# Patient Record
Sex: Male | Born: 1955
Health system: Southern US, Community
[De-identification: ages and names within clinical notes are randomized; demographics above are authoritative.]

## PROBLEM LIST (undated history)

## (undated) DIAGNOSIS — R112 Nausea with vomiting, unspecified: Secondary | ICD-10-CM

## (undated) DIAGNOSIS — Z9889 Other specified postprocedural states: Secondary | ICD-10-CM

## (undated) DIAGNOSIS — R5383 Other fatigue: Secondary | ICD-10-CM

## (undated) DIAGNOSIS — M1990 Unspecified osteoarthritis, unspecified site: Secondary | ICD-10-CM

## (undated) DIAGNOSIS — S83207A Unspecified tear of unspecified meniscus, current injury, left knee, initial encounter: Secondary | ICD-10-CM

## (undated) DIAGNOSIS — Z87442 Personal history of urinary calculi: Secondary | ICD-10-CM

## (undated) DIAGNOSIS — G4730 Sleep apnea, unspecified: Secondary | ICD-10-CM

## (undated) DIAGNOSIS — R600 Localized edema: Secondary | ICD-10-CM

## (undated) DIAGNOSIS — E559 Vitamin D deficiency, unspecified: Secondary | ICD-10-CM

## (undated) DIAGNOSIS — I10 Essential (primary) hypertension: Secondary | ICD-10-CM

## (undated) DIAGNOSIS — T4145XA Adverse effect of unspecified anesthetic, initial encounter: Secondary | ICD-10-CM

## (undated) DIAGNOSIS — T7840XA Allergy, unspecified, initial encounter: Secondary | ICD-10-CM

## (undated) DIAGNOSIS — I2510 Atherosclerotic heart disease of native coronary artery without angina pectoris: Secondary | ICD-10-CM

## (undated) DIAGNOSIS — M2550 Pain in unspecified joint: Secondary | ICD-10-CM

## (undated) DIAGNOSIS — M549 Dorsalgia, unspecified: Secondary | ICD-10-CM

## (undated) DIAGNOSIS — T8859XA Other complications of anesthesia, initial encounter: Secondary | ICD-10-CM

## (undated) DIAGNOSIS — E785 Hyperlipidemia, unspecified: Secondary | ICD-10-CM

## (undated) DIAGNOSIS — R0602 Shortness of breath: Secondary | ICD-10-CM

## (undated) HISTORY — DX: Sleep apnea, unspecified: G47.30

## (undated) HISTORY — DX: Dorsalgia, unspecified: M54.9

## (undated) HISTORY — PX: BACK SURGERY: SHX140

## (undated) HISTORY — DX: Atherosclerotic heart disease of native coronary artery without angina pectoris: I25.10

## (undated) HISTORY — DX: Localized edema: R60.0

## (undated) HISTORY — DX: Pain in unspecified joint: M25.50

## (undated) HISTORY — PX: OTHER SURGICAL HISTORY: SHX169

## (undated) HISTORY — DX: Other fatigue: R53.83

## (undated) HISTORY — DX: Allergy, unspecified, initial encounter: T78.40XA

## (undated) HISTORY — DX: Hyperlipidemia, unspecified: E78.5

## (undated) HISTORY — PX: MR HIPS BILATERAL: HXRAD169

## (undated) HISTORY — DX: Vitamin D deficiency, unspecified: E55.9

## (undated) HISTORY — DX: Unspecified osteoarthritis, unspecified site: M19.90

## (undated) HISTORY — DX: Shortness of breath: R06.02

---

## 1997-01-16 HISTORY — PX: LAMINECTOMY: SHX219

## 1997-07-19 ENCOUNTER — Emergency Department (HOSPITAL_COMMUNITY): Admission: EM | Admit: 1997-07-19 | Discharge: 1997-07-19 | Payer: Self-pay | Admitting: Emergency Medicine

## 2004-01-25 ENCOUNTER — Ambulatory Visit: Payer: Self-pay | Admitting: Internal Medicine

## 2006-11-26 ENCOUNTER — Telehealth: Payer: Self-pay | Admitting: Internal Medicine

## 2007-01-03 ENCOUNTER — Ambulatory Visit: Payer: Self-pay | Admitting: Internal Medicine

## 2007-01-03 DIAGNOSIS — M1990 Unspecified osteoarthritis, unspecified site: Secondary | ICD-10-CM | POA: Insufficient documentation

## 2007-01-03 DIAGNOSIS — M519 Unspecified thoracic, thoracolumbar and lumbosacral intervertebral disc disorder: Secondary | ICD-10-CM | POA: Insufficient documentation

## 2007-01-04 ENCOUNTER — Encounter: Payer: Self-pay | Admitting: Internal Medicine

## 2007-01-18 ENCOUNTER — Inpatient Hospital Stay (HOSPITAL_COMMUNITY): Admission: RE | Admit: 2007-01-18 | Discharge: 2007-01-21 | Payer: Self-pay | Admitting: Orthopaedic Surgery

## 2007-04-07 ENCOUNTER — Encounter: Payer: Self-pay | Admitting: Emergency Medicine

## 2007-04-07 ENCOUNTER — Observation Stay (HOSPITAL_COMMUNITY): Admission: AD | Admit: 2007-04-07 | Discharge: 2007-04-09 | Payer: Self-pay | Admitting: Urology

## 2007-04-22 ENCOUNTER — Ambulatory Visit (HOSPITAL_COMMUNITY): Admission: RE | Admit: 2007-04-22 | Discharge: 2007-04-22 | Payer: Self-pay | Admitting: Urology

## 2008-01-17 HISTORY — PX: CYSTOSCOPY KIDNEY W/ URETERAL GUIDE WIRE: SUR371

## 2008-08-20 ENCOUNTER — Ambulatory Visit: Payer: Self-pay | Admitting: Internal Medicine

## 2008-08-20 DIAGNOSIS — Z87442 Personal history of urinary calculi: Secondary | ICD-10-CM

## 2008-09-25 ENCOUNTER — Inpatient Hospital Stay (HOSPITAL_COMMUNITY): Admission: RE | Admit: 2008-09-25 | Discharge: 2008-09-28 | Payer: Self-pay | Admitting: Orthopaedic Surgery

## 2008-10-13 ENCOUNTER — Telehealth (INDEPENDENT_AMBULATORY_CARE_PROVIDER_SITE_OTHER): Payer: Self-pay | Admitting: *Deleted

## 2009-04-08 ENCOUNTER — Encounter: Payer: Self-pay | Admitting: Internal Medicine

## 2010-02-13 LAB — CONVERTED CEMR LAB
Bilirubin, Direct: 0.2 mg/dL (ref 0.0–0.3)
Eosinophils Absolute: 0.2 10*3/uL (ref 0.0–0.6)
Eosinophils Relative: 2.5 % (ref 0.0–5.0)
GFR calc Af Amer: 101 mL/min
GFR calc non Af Amer: 84 mL/min
Glucose, Bld: 98 mg/dL (ref 70–99)
HCT: 45.3 % (ref 39.0–52.0)
Lymphocytes Relative: 25.1 % (ref 12.0–46.0)
MCV: 87 fL (ref 78.0–100.0)
Neutro Abs: 5.5 10*3/uL (ref 1.4–7.7)
Neutrophils Relative %: 64.2 % (ref 43.0–77.0)
PSA: 1.57 ng/mL (ref 0.10–4.00)
Platelets: 179 10*3/uL (ref 150–400)
Potassium: 4.7 meq/L (ref 3.5–5.1)
Sodium: 139 meq/L (ref 135–145)
Triglycerides: 86 mg/dL (ref 0–149)
WBC: 8.5 10*3/uL (ref 4.5–10.5)

## 2010-03-25 ENCOUNTER — Encounter (INDEPENDENT_AMBULATORY_CARE_PROVIDER_SITE_OTHER): Payer: Self-pay | Admitting: *Deleted

## 2010-03-29 NOTE — Letter (Signed)
Summary: Pre Visit Letter Revised  [facility]  [address] [place], [place] [address]   Phone: [phone]  Fax: [phone]        03/25/2010 MRN: [id number] Keith Brown [address] [place], [place]  [address]             Procedure Date:  04-29-10           Direct Colon---Dr. Juanda Chance   Welcome to the Gastroenterology Division at [facility].    You are scheduled to see a nurse for your pre-procedure visit on 04-14-10 at 1:00p.m. on the 3rd floor at [facility], [address].  We ask that you try to arrive at our office 15 minutes prior to your appointment time to allow for check-in.  Please take a minute to review the attached form.  If you answer "Yes" to one or more of the questions on the first page, we ask that you call the person listed at your earliest opportunity.  If you answer "No" to all of the questions, please complete the rest of the form and bring it to your appointment.    Your nurse visit will consist of discussing your medical and surgical history, your immediate family medical history, and your medications.   If you are unable to list all of your medications on the form, please bring the medication bottles to your appointment and we will list them.  We will need to be aware of both prescribed and over the counter drugs.  We will need to know exact dosage information as well.    Please be prepared to read and sign documents such as consent forms, a financial agreement, and acknowledgement forms.  If necessary, and with your consent, a friend or relative is welcome to sit-in on the nurse visit with you.  Please bring your insurance card so that we may make a copy of it.  If your insurance requires a referral to see a specialist, please bring your referral form from your primary care physician.  No co-pay is required for this nurse visit.     If you cannot keep your appointment, please call [phone] to cancel or reschedule prior to your  appointment date.  This allows [place] the opportunity to schedule an appointment for another patient in need of care.    Thank you for choosing [facility] for your medical needs.  We appreciate the opportunity to care for you.  Please visit [place] at our website  to learn more about our practice.  Sincerely, The Gastroenterology Division

## 2010-04-14 ENCOUNTER — Ambulatory Visit (AMBULATORY_SURGERY_CENTER): Payer: BC Managed Care – PPO | Admitting: *Deleted

## 2010-04-14 VITALS — Ht 70.0 in | Wt 321.0 lb

## 2010-04-14 DIAGNOSIS — Z1211 Encounter for screening for malignant neoplasm of colon: Secondary | ICD-10-CM

## 2010-04-14 DIAGNOSIS — Z800 Family history of malignant neoplasm of digestive organs: Secondary | ICD-10-CM

## 2010-04-14 MED ORDER — PEG-KCL-NACL-NASULF-NA ASC-C 100 G PO SOLR: 1.0000 | Freq: Once | ORAL | Status: AC

## 2010-04-22 LAB — COMPREHENSIVE METABOLIC PANEL
ALT: 22 U/L (ref 0–53)
AST: 23 U/L (ref 0–37)
Alkaline Phosphatase: 78 U/L (ref 39–117)
CO2: 25 mEq/L (ref 19–32)
Glucose, Bld: 106 mg/dL — ABNORMAL HIGH (ref 70–99)
Potassium: 4.9 mEq/L (ref 3.5–5.1)
Sodium: 140 mEq/L (ref 135–145)
Total Protein: 7 g/dL (ref 6.0–8.3)

## 2010-04-22 LAB — URINALYSIS, ROUTINE W REFLEX MICROSCOPIC
Glucose, UA: NEGATIVE mg/dL
Ketones, ur: NEGATIVE mg/dL
Specific Gravity, Urine: 1.019 (ref 1.005–1.030)
pH: 5 (ref 5.0–8.0)

## 2010-04-22 LAB — CBC
HCT: 33.5 % — ABNORMAL LOW (ref 39.0–52.0)
HCT: 34 % — ABNORMAL LOW (ref 39.0–52.0)
Hemoglobin: 11.4 g/dL — ABNORMAL LOW (ref 13.0–17.0)
Hemoglobin: 11.6 g/dL — ABNORMAL LOW (ref 13.0–17.0)
Hemoglobin: 12 g/dL — ABNORMAL LOW (ref 13.0–17.0)
MCHC: 33.9 g/dL (ref 30.0–36.0)
MCHC: 34.3 g/dL (ref 30.0–36.0)
MCV: 87.2 fL (ref 78.0–100.0)
RBC: 3.84 MIL/uL — ABNORMAL LOW (ref 4.22–5.81)
RBC: 3.92 MIL/uL — ABNORMAL LOW (ref 4.22–5.81)
RBC: 5.26 MIL/uL (ref 4.22–5.81)
RDW: 13.5 % (ref 11.5–15.5)
RDW: 14 % (ref 11.5–15.5)
WBC: 7.6 10*3/uL (ref 4.0–10.5)
WBC: 8.6 10*3/uL (ref 4.0–10.5)

## 2010-04-22 LAB — BASIC METABOLIC PANEL
BUN: 8 mg/dL (ref 6–23)
CO2: 24 mEq/L (ref 19–32)
CO2: 29 mEq/L (ref 19–32)
Calcium: 8.5 mg/dL (ref 8.4–10.5)
Chloride: 101 mEq/L (ref 96–112)
Chloride: 106 mEq/L (ref 96–112)
Creatinine, Ser: 0.96 mg/dL (ref 0.4–1.5)
GFR calc Af Amer: >60 mL/min (ref >60)
GFR calc non Af Amer: >60 mL/min (ref >60)
Glucose, Bld: 112 mg/dL — ABNORMAL HIGH (ref 70–99)
Glucose, Bld: 124 mg/dL — ABNORMAL HIGH (ref 70–99)
Potassium: 3.8 mEq/L (ref 3.5–5.1)
Potassium: 4 mEq/L (ref 3.5–5.1)
Sodium: 133 mEq/L — ABNORMAL LOW (ref 135–145)
Sodium: 134 mEq/L — ABNORMAL LOW (ref 135–145)
Sodium: 138 mEq/L (ref 135–145)

## 2010-04-22 LAB — APTT: aPTT: 48 seconds — ABNORMAL HIGH (ref 24–37)

## 2010-04-22 LAB — PROTIME-INR: INR: 1.9 — ABNORMAL HIGH (ref 0.00–1.49)

## 2010-04-29 ENCOUNTER — Ambulatory Visit (AMBULATORY_SURGERY_CENTER): Payer: BC Managed Care – PPO | Admitting: Internal Medicine

## 2010-04-29 ENCOUNTER — Encounter: Payer: Self-pay | Admitting: Internal Medicine

## 2010-04-29 VITALS — BP 132/86 | HR 50 | Temp 97.6°F | Resp 19 | Ht 69.0 in | Wt 321.0 lb

## 2010-04-29 DIAGNOSIS — K5730 Diverticulosis of large intestine without perforation or abscess without bleeding: Secondary | ICD-10-CM

## 2010-04-29 DIAGNOSIS — Z1211 Encounter for screening for malignant neoplasm of colon: Secondary | ICD-10-CM

## 2010-04-29 DIAGNOSIS — D126 Benign neoplasm of colon, unspecified: Secondary | ICD-10-CM

## 2010-04-29 DIAGNOSIS — Z800 Family history of malignant neoplasm of digestive organs: Secondary | ICD-10-CM

## 2010-04-29 DIAGNOSIS — R933 Abnormal findings on diagnostic imaging of other parts of digestive tract: Secondary | ICD-10-CM

## 2010-04-29 MED ORDER — SODIUM CHLORIDE 0.9 % IV SOLN: 500.0000 mL | INTRAVENOUS | Status: DC

## 2010-04-29 NOTE — Patient Instructions (Signed)
Discharged instructions given with verbal understanding. Hand outs on polyps and diverticulosis given. Resume previous medications. 

## 2010-05-02 ENCOUNTER — Telehealth: Payer: Self-pay | Admitting: *Deleted

## 2010-05-02 NOTE — Telephone Encounter (Signed)
No answer, left message

## 2010-05-03 ENCOUNTER — Encounter: Payer: Self-pay | Admitting: Internal Medicine

## 2010-05-31 NOTE — Op Note (Signed)
NAMESRIHITH, AQUILINO NO.:  [id number]   MEDICAL RECORD NO.:  [id number]          PATIENT TYPE:  INP   LOCATION:  5025                         FACILITY:  [facility]   PHYSICIAN:  Vanita Panda. Magnus Ivan, M.D.DATE OF BIRTH:  05/10/1955   DATE OF PROCEDURE:  01/18/2007  DATE OF DISCHARGE:  01/21/2007                               OPERATIVE REPORT   PREOPERATIVE DIAGNOSIS:  Severe degenerative joint disease right hip.   POSTOPERATIVE DIAGNOSES:  Severe degenerative joint disease right hip.   PROCEDURE:  Right total hip arthroplasty.   COMPONENTS:  DePuy ASR acetabular cup size 58, size 51 femoral head,  Summit tapered hip stem with duofix coating high offset size 6, -1  tapered sleeve adapter.   SURGEON:  Doneen Poisson, MD   ASSISTANT:  Veverly Fells. Ophelia Charter, M.D.   ANESTHESIA:  General.   ANTIBIOTICS:  Two grams IV Ancef.   BLOOD LOSS:  Eight-hundred mL.   COMPLICATIONS:  None.   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  He has tried anti-inflammatories  and has been doing well for some time but has gotten to where this has  severely affected his activities of daily living.  It was recommend he  undergo a total hip replacement on the right side.  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   DESCRIPTION OF PROCEDURE:  After informed consent was obtained and the  appropriate right hip was marked, Dr. Otelia Sergeant was brought to the operating  room, placed supine on the operating table.  General anesthesia was  obtained.  He was then turned into lateral decubitus position with the  left hip down and the right hip up.  His right hip was prepped and  draped with DuraPrep and sterile drapes including sterile stockinette.  A time-out was  called and he was identified as correct patient and  correct extremity.  I then made an incision directly centered over the  greater trochanter and carried this proximally and distally. I carried  this deeply through the tissues and hemostasis was obtained using  electrocautery.  I then was able to identify the IT band and the tensor  fascia lata. I divided this longitudinally so that I could visualize the  vastus ridge. A Charnley retractor was put in place.  I then  meticulously dissected two-thirds of the gluteus medius and minimus off  of as a sleeve off of the vastus ridge and carried this anteriorly.  I  was able to expose the hip capsule and divided the hip capsule in a T-  type fashion.  There was certainly synovitis noted and effusion in the  joint was minimal. With the lateral leg bags I was able to then  dislocate the hip and bring the leg flexed and externally rotated off  the table.  I then made a neck cut approximately a fingerbreadth  proximal to the lesser trochanter and removed the head in its entirety.  Next the acetabulum was cleared of debris and we started reaming with  the acetabulum.  We reamed this in 1-mm increments up to a 59.  We  trialed an acetabular cup and then once the trial cup had a good rim fit  and I felt confident on the version I turned attention to the proximal  femur.  The canal was entered with a cookie cutter guide and then a  canal finder.  We then reamed in 1-mm increments from size one up to the  size 6-7.  I then used a broach was size 1 trying to lateralize the  broach and once we had got to a size 6, this was felt to be the most  stable fit with good cortical contact and rim.  We tried a -1 taper stem  and placed a 51 head and reduced this into the acetabulum.  I felt that  there was minimal shuck and the leg length appeared to be equal.  We got  an interoperative plain film that showed adequate version of the cup and  overall good fill of  the stem. I then removed all of these components  and thoroughly irrigated the acetabulum as well as the femoral canal.  The trial components again were removed and the real components were  placed including the acetabular with metal-on-metal liner, the size six  hydroxyapatite coated duofix stem which was proximally porous coated and  then finally the -1 tapered neck and the 51 head.  This was used on the  acetabulum.  I put the hip through a range of motion.  It was stable in  all planes.  I then thoroughly irrigated the hip joint and the soft  tissues with pulsatile lavage.  I closed the joint capsule with  interrupted #5 Ethilon suture.  I then was able to reapproximate the  gluteus medius and minimus back to their soft tissue attachment at the  vastus ridge and oversewed this with Ethilon suture followed by #1  Vicryl suture.  I then closed the IT band with interrupted #1 Vicryl  suture.  The deep fatty layer was closed with zero Vicryl followed by 2-  0 Vicryl subcutaneous tissue and staples on the skin.  Sterile dressing  was then applied.  I rolled him back into a supine position.  His leg  lengths felt exactly equal.  He was then awakened, extubated and taken  to recovery room in stable condition.  All final counts were correct and  there were no complications noted.      Vanita Panda. Magnus Ivan, M.D.  Electronically Signed     CYB/MEDQ  D:  02/05/2007  T:  02/06/2007  Job:  [id number]

## 2010-05-31 NOTE — Op Note (Signed)
NAMEJAYLEE, LANTRY                 ACCOUNT NO.:  [id number]   MEDICAL RECORD NO.:  [id number]          PATIENT TYPE:  INP   LOCATION:  1438                         FACILITY:  [facility]   PHYSICIAN:  Excell Seltzer. Annabell Howells, M.D.    DATE OF BIRTH:  08-09-1955   DATE OF PROCEDURE:  04/08/2007  DATE OF DISCHARGE:                               OPERATIVE REPORT   PROCEDURE:  Cystoscopy, left retrograde pyelogram with interpretation,  left flexible ureteroscopy with attempted lasertripsy of stone and  insertion of left double-J stent.   PREOPERATIVE DIAGNOSIS:  Left proximal ureteral stone.   POSTOPERATIVE DIAGNOSIS:  Left proximal ureteral stone.   SURGEON:  Dr. Bjorn Pippin.   ANESTHESIA:  General.   DRAINS:  6-French x 26 cm left double-J stent.   COMPLICATIONS:  None.   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  He was found to have a  4 mm stone in the lower portion of the left proximal ureter with mild  hydro.  He has continued to have symptoms and has elected active therapy  and it was felt that attempted ureteroscopy was indicated.   FINDINGS AND PROCEDURE:  The patient had been on Cipro.  He was taken to  the operating room where general anesthetic was induced.  He was placed  in lithotomy position with great care being taken to avoid overflexion  of his hips since he had a recent hip replacement of the right.  His  genitalia was prepped with Betadine solution.  He was draped in the  usual sterile fashion.  Cystoscopy was performed using a 22-French scope  and 12 and 70 degrees lenses.  Examination revealed a normal urethra.  The external sphincter was intact.  The prostatic urethra was short  without obstruction.  Examination of bladder revealed no trabeculation.  The mucous was smooth without lesions.  The ureteral orifices were  unremarkable.   The left ureteral orifice was cannulated with a 5-French open end  catheter and contrast was instilled in retrograde fashion.   Left retrograde pyelogram revealed no evidence of filling defect and it  was felt that the stone was flushed back to the kidney.  There was  narrowing where the stone had been on KUB.  The ureters were markedly  delicate as was the internal collecting system.   A guidewire was then passed up to the kidney and a 12-French introducer  sheath inner core dilator 55 cm in length was used to dilate the ureter  to the renal pelvis.  I did not feel comfortable using the actual sheath  because of the delicate nature the ureter and fear of ureteral tearing.   The 6-French flexible ureteroscope was then inserted over the wire to  the kidney.  The wire was removed and the kidney was inspected. The  stone was noted in the lower pole collecting system in a calix.  It did  appear to measure approximately 4 mm.   An attempt was made to engage the stone with a 0.275 holmium laser  fiber.  I was only able to fire the laser approximately a dozen times  before determining that I was not able to come to bear on the stone and  there was increased bleeding that precluded adequate visualization.  At  this point I removed the laser fiber and irrigated the collecting system  once again without ready visualization of the stone.  At this point it  was felt that the most prudent option would be to place a stent.  The  guidewire was then reinserted through the ureteroscope to the kidney  under fluoroscopic guidance.  The ureteroscope was removed.  The  cystoscope was reinserted over the wire and a 6-French 26 cm double-J  stent was inserted without difficulty to the kidney under fluoroscopic  guidance.  The wire was removed leaving a coil in the kidney and a coil  in the bladder.  At this point the bladder was drained.  A B & O  suppository was placed.  The patient was taken down out of the lithotomy  position.  His anesthetic was reversed.  He was  removed to the recovery  room in stable condition.  He was given an additional dose of Cipro  since his last dose was at 11 this morning.  He was taken to the  recovery room in stable condition and there were no complications.      Excell Seltzer. Annabell Howells, M.D.  Electronically Signed     JJW/MEDQ  D:  04/08/2007  T:  04/09/2007  Job:  [id number]

## 2010-05-31 NOTE — Op Note (Signed)
NAMEXZAVIAN, SEMMEL NO.:  [id number]   MEDICAL RECORD NO.:  [id number]          PATIENT TYPE:  INP   LOCATION:  2550                         FACILITY:  [facility]   PHYSICIAN:  Vanita Panda. Magnus Ivan, M.D.DATE OF BIRTH:  December 16, 1955   DATE OF PROCEDURE:  01/18/2007  DATE OF DISCHARGE:                               OPERATIVE REPORT   PREOPERATIVE DIAGNOSIS:  Severe osteoarthritis right hip.   POSTOPERATIVE DIAGNOSIS:  Severe osteoarthritis right hip.   PROCEDURE:  A right total hip arthroplasty.   __________  Acetabular component size 58 mm, 51 mm __________  minus 1 ASR sleeve, __________   SURGEON:  __________   ASSISTANT:  __________  MD   __________   ANESTHESIA:  General.   __________  55 year-old male with severe osteoarthritis __________  This has become  quite debilitating for him.  __________ and he wishes to proceed with a  total hip replacement.  He understands the risks and benefits fully and  agrees to proceed with surgery.   PROCEDURE:  INAUDIBLE      Vanita Panda. Magnus Ivan, M.D.  Electronically Signed     CYB/MEDQ  D:  01/18/2007  T:  01/18/2007  Job:  [id number]

## 2010-06-03 NOTE — Discharge Summary (Signed)
NAMEMACLAIN, COHRON NO.:  [id number]   MEDICAL RECORD NO.:  [id number]          PATIENT TYPE:  INP   LOCATION:  5025                         FACILITY:  [facility]   PHYSICIAN:  Vanita Panda. Magnus Ivan, M.D.DATE OF BIRTH:  09-17-1955   DATE OF ADMISSION:  01/18/2007  DATE OF DISCHARGE:  01/21/2007                               DISCHARGE SUMMARY   ADMITTING DIAGNOSIS:  Severe degenerative joint disease right hip.   DISCHARGE DIAGNOSIS:  Severe degenerative joint disease right hip.   PROCEDURE:  Right total hip arthroplasty on January 18, 2007.   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  He had failed  conservative nonoperative treatment, and this was greatly affecting his  activities of daily living, so he agreed to proceed with surgery.  He  was taken to the operating room on the day of admission, where he  underwent a right total hip arthroplasty without complications.  He was  then admitted to a regular orthopedic floor bed.  Postoperatively, we  started him on weightbearing as tolerated, and worked with physical  therapy and occupational therapy.  By postop day #1, the Foley catheter  was removed.  He started tolerating an oral diet as well as oral pain  medications.  By the day of discharge, he was deemed safe for discharge  to home, with close observation from home health physical therapy.  He  was on Coumadin for DVT prophylaxis.  He was tolerating oral pain  medications.  He was afebrile, with stable vital signs.  His incision  was clean, dry, intact.   DISPOSITION:  To home.   DISCHARGE MEDICATIONS:  1. Coumadin, titrated for target INR of 2-3.  2. Vicodin 5/500, 1-2 p.o. q.4-6 h. p.r.n.  3. Robaxin 500 mg 1 p.o. q.6 h. p.r.n..   DISCHARGE INSTRUCTIONS:  While he is at home, physical therapy will come  into his house to work with mobilization.  He can get his hip  incision  wet at postop day #5.  Monitoring of his INR will be obtained through  home health as well.  Followup will be in the office and will be  established 2 weeks after discharge.      Vanita Panda. Magnus Ivan, M.D.  Electronically Signed     CYB/MEDQ  D:  02/05/2007  T:  02/06/2007  Job:  [id number]

## 2010-10-06 LAB — BASIC METABOLIC PANEL
BUN: 10
Calcium: 8.3 — ABNORMAL LOW
Calcium: 8.3 — ABNORMAL LOW
Chloride: 101
Creatinine, Ser: 0.88
Creatinine, Ser: 0.9
GFR calc Af Amer: >60
GFR calc Af Amer: >60
GFR calc non Af Amer: >60
GFR calc non Af Amer: >60
GFR calc non Af Amer: >60
Potassium: 3.6
Potassium: 3.9
Sodium: 137

## 2010-10-06 LAB — CBC
HCT: 33.8 — ABNORMAL LOW
Hemoglobin: 11.8 — ABNORMAL LOW
MCV: 86.5
Platelets: 145 — ABNORMAL LOW
RBC: 3.91 — ABNORMAL LOW
RBC: 3.99 — ABNORMAL LOW
WBC: 12.4 — ABNORMAL HIGH
WBC: 8.9

## 2010-10-06 LAB — PROTIME-INR
INR: 1.2
INR: 1.6 — ABNORMAL HIGH
Prothrombin Time: 15.6 — ABNORMAL HIGH
Prothrombin Time: 19.5 — ABNORMAL HIGH
Prothrombin Time: 21.9 — ABNORMAL HIGH

## 2010-10-10 LAB — URINE MICROSCOPIC-ADD ON

## 2010-10-10 LAB — CBC
HCT: 45.1
MCHC: 33.8
MCV: 84.4
Platelets: 168
RDW: 14.6
WBC: 8.9

## 2010-10-10 LAB — COMPREHENSIVE METABOLIC PANEL
AST: 25
Albumin: 4.1
BUN: 14
Calcium: 9.2
Chloride: 106
Creatinine, Ser: 1.03
GFR calc Af Amer: >60
Total Bilirubin: 0.6
Total Protein: 7.2

## 2010-10-10 LAB — URINALYSIS, ROUTINE W REFLEX MICROSCOPIC
Leukocytes, UA: NEGATIVE
Protein, ur: NEGATIVE
Specific Gravity, Urine: 1.022
Urobilinogen, UA: 0.2

## 2010-10-10 LAB — DIFFERENTIAL
Basophils Absolute: 0
Eosinophils Relative: 2
Lymphocytes Relative: 27
Lymphs Abs: 2.4
Monocytes Absolute: 0.8
Neutro Abs: 5.5

## 2010-10-21 LAB — BASIC METABOLIC PANEL
BUN: 17
Chloride: 107
GFR calc non Af Amer: >60
Glucose, Bld: 96
Potassium: 4.1
Sodium: 138

## 2010-10-21 LAB — PROTIME-INR
INR: 1
Prothrombin Time: 13.1

## 2010-10-21 LAB — CBC
Hemoglobin: 15.2
MCHC: 34.4
RDW: 13.2

## 2010-10-21 LAB — APTT: aPTT: 27

## 2010-12-21 ENCOUNTER — Other Ambulatory Visit: Payer: Self-pay | Admitting: Specialist

## 2011-01-03 ENCOUNTER — Other Ambulatory Visit: Payer: Self-pay | Admitting: Specialist

## 2011-01-17 HISTORY — PX: KNEE ARTHROSCOPY: SUR90

## 2011-08-28 ENCOUNTER — Other Ambulatory Visit (HOSPITAL_COMMUNITY): Payer: Self-pay | Admitting: Orthopaedic Surgery

## 2011-09-11 ENCOUNTER — Ambulatory Visit (INDEPENDENT_AMBULATORY_CARE_PROVIDER_SITE_OTHER): Payer: BC Managed Care – PPO | Admitting: Internal Medicine

## 2011-09-11 ENCOUNTER — Ambulatory Visit (INDEPENDENT_AMBULATORY_CARE_PROVIDER_SITE_OTHER)
Admission: RE | Admit: 2011-09-11 | Discharge: 2011-09-11 | Disposition: A | Discharge status: Home / Self Care | Payer: BC Managed Care – PPO | Source: Ambulatory Visit | Attending: Internal Medicine | Admitting: Internal Medicine

## 2011-09-11 ENCOUNTER — Encounter: Payer: Self-pay | Admitting: Internal Medicine

## 2011-09-11 VITALS — BP 130/80 | HR 84 | Temp 98.8°F | Resp 20 | Ht 70.0 in | Wt 330.0 lb

## 2011-09-11 DIAGNOSIS — R7302 Impaired glucose tolerance (oral): Secondary | ICD-10-CM

## 2011-09-11 DIAGNOSIS — M1990 Unspecified osteoarthritis, unspecified site: Secondary | ICD-10-CM

## 2011-09-11 DIAGNOSIS — Z299 Encounter for prophylactic measures, unspecified: Secondary | ICD-10-CM

## 2011-09-11 DIAGNOSIS — R7309 Other abnormal glucose: Secondary | ICD-10-CM

## 2011-09-11 DIAGNOSIS — Z86010 Personal history of colonic polyps: Secondary | ICD-10-CM

## 2011-09-11 DIAGNOSIS — M519 Unspecified thoracic, thoracolumbar and lumbosacral intervertebral disc disorder: Secondary | ICD-10-CM

## 2011-09-11 DIAGNOSIS — E6609 Other obesity due to excess calories: Secondary | ICD-10-CM

## 2011-09-11 DIAGNOSIS — E669 Obesity, unspecified: Secondary | ICD-10-CM

## 2011-09-11 LAB — CBC WITH DIFFERENTIAL/PLATELET
Basophils Absolute: 0 10*3/uL (ref 0.0–0.1)
Basophils Relative: 0.3 % (ref 0.0–3.0)
Eosinophils Absolute: 0.2 10*3/uL (ref 0.0–0.7)
HCT: 46.4 % (ref 39.0–52.0)
Hemoglobin: 15.3 g/dL (ref 13.0–17.0)
Lymphocytes Relative: 20.4 % (ref 12.0–46.0)
Lymphs Abs: 1.8 10*3/uL (ref 0.7–4.0)
MCHC: 32.9 g/dL (ref 30.0–36.0)
MCV: 87.9 fl (ref 78.0–100.0)
Monocytes Absolute: 0.7 10*3/uL (ref 0.1–1.0)
Neutro Abs: 6 10*3/uL (ref 1.4–7.7)
RBC: 5.27 Mil/uL (ref 4.22–5.81)
RDW: 13.8 % (ref 11.5–14.6)

## 2011-09-11 LAB — COMPREHENSIVE METABOLIC PANEL
ALT: 35 U/L (ref 0–53)
AST: 34 U/L (ref 0–37)
Alkaline Phosphatase: 61 U/L (ref 39–117)
BUN: 15 mg/dL (ref 6–23)
Calcium: 9.1 mg/dL (ref 8.4–10.5)
Chloride: 107 mEq/L (ref 96–112)
Creatinine, Ser: 1 mg/dL (ref 0.4–1.5)
Total Bilirubin: 0.8 mg/dL (ref 0.3–1.2)

## 2011-09-11 LAB — POCT URINALYSIS DIPSTICK
Blood, UA: NEGATIVE
Glucose, UA: NEGATIVE
Nitrite, UA: NEGATIVE
Spec Grav, UA: 1.02
Urobilinogen, UA: 0.2
pH, UA: 5

## 2011-09-11 NOTE — Progress Notes (Signed)
Subjective:    Patient ID: Keith Brown, male    DOB: March 02, 1955, 56 y.o.   MRN: [id number]  HPI   56 year old patient who is seen today for a preoperative evaluation and examination. He is scheduled later this week for a right hip revision surgery. He does have a history of osteoarthritis and has had lumbar disc surgery in the past for a right S1 radiculopathy.  Over the past year he is also had right knee arthroscopic surgery and meniscectomy. Medical problems include exogenous obesity as well as impaired glucose tolerance.  He is a nonsmoker and denies any cardiopulmonary complaints. He is fairly inactive but denies any exertional chest pain or shortness of breath He takes no chronic medications  Past Medical History  Diagnosis Date  . Allergy     fire ants    History   Social History  . Marital Status: Married    Spouse Name: N/A    Number of Children: N/A  . Years of Education: N/A   Occupational History  . Not on file.   Social History Main Topics  . Smoking status: Never Smoker   . Smokeless tobacco: Never Used  . Alcohol Use: 4.2 oz/week    7 Glasses of wine per week  . Drug Use: No  . Sexually Active: Not on file   Other Topics Concern  . Not on file   Social History Narrative  . No narrative on file    Past Surgical History  Procedure Date  . Mr hips bilateral     2009&2010  . Back surgery     laminectomy L5-S1  . Cystoscopy kidney w/ ureteral guide wire     lithotripsy    Family History  Problem Relation Age of Onset  . Colon cancer Father     No Known Allergies  No current outpatient prescriptions on file prior to visit.   Current Facility-Administered Medications on File Prior to Visit  Medication Dose Route Frequency Provider Last Rate Last Dose  . DISCONTD: 0.9 %  sodium chloride infusion  500 mL Intravenous Continuous Hart Carwin, MD        BP 130/80  Pulse 84  Temp 98.8 F (37.1 C) (Oral)  Resp 20  Ht 5\' 10"  (1.778 m)  Wt 330 lb  (149.687 kg)  BMI 47.35 kg/m2  SpO2 97%       Review of Systems  Constitutional: Positive for unexpected weight change. Negative for fever, chills, appetite change and fatigue.  HENT: Negative for hearing loss, ear pain, congestion, sore throat, trouble swallowing, neck stiffness, dental problem, voice change and tinnitus.   Eyes: Negative for pain, discharge and visual disturbance.  Respiratory: Negative for cough, chest tightness, wheezing and stridor.   Cardiovascular: Negative for chest pain, palpitations and leg swelling.  Gastrointestinal: Negative for nausea, vomiting, abdominal pain, diarrhea, constipation, blood in stool and abdominal distention.  Genitourinary: Negative for urgency, hematuria, flank pain, discharge, difficulty urinating and genital sores.  Musculoskeletal: Positive for back pain, arthralgias and gait problem. Negative for myalgias and joint swelling.  Skin: Negative for rash.  Neurological: Negative for dizziness, syncope, speech difficulty, weakness, numbness and headaches.  Hematological: Negative for adenopathy. Does not bruise/bleed easily.  Psychiatric/Behavioral: Negative for behavioral problems and dysphoric mood. The patient is not nervous/anxious.        Objective:   Physical Exam  Constitutional: He is oriented to person, place, and time. He appears well-developed.       Weight 330 Blood  pressure 130/80   HENT:  Head: Normocephalic.  Right Ear: External ear normal.  Left Ear: External ear normal.  Eyes: Conjunctivae and EOM are normal.  Neck: Normal range of motion.  Cardiovascular: Normal rate, regular rhythm and normal heart sounds.   Pulmonary/Chest: Breath sounds normal.       O2 saturation 97 Pulse rate 70  Abdominal: Bowel sounds are normal.  Musculoskeletal: Normal range of motion. He exhibits no edema and no tenderness.       Trace pedal edema  Neurological: He is alert and oriented to person, place, and time.       Absent  right Achilles reflex  Psychiatric: He has a normal mood and affect. His behavior is normal.          Assessment & Plan:    Preop/preventive health exam. No contraindications to the anticipated surgery. We'll obtain an EKG and preoperative lab Exogenous obesity Impaired glucose tolerance. Blood sugar will be reviewed Osteoarthritis

## 2011-09-11 NOTE — Patient Instructions (Addendum)
Limit your sodium (Salt) intake    It is important that you exercise regularly, at least 20 minutes 3 to 4 times per week.  If you develop chest pain or shortness of breath seek  medical attention.  You need to lose weight.  Consider a lower calorie diet and regular exercise.  Return in one year for follow-up 

## 2011-09-14 ENCOUNTER — Encounter (HOSPITAL_COMMUNITY)
Admission: RE | Admit: 2011-09-14 | Discharge: 2011-09-14 | Disposition: A | Discharge status: Home / Self Care | Payer: BC Managed Care – PPO | Source: Ambulatory Visit | Attending: Orthopaedic Surgery | Admitting: Orthopaedic Surgery

## 2011-09-14 ENCOUNTER — Encounter (HOSPITAL_COMMUNITY): Payer: Self-pay

## 2011-09-14 HISTORY — DX: Other complications of anesthesia, initial encounter: T88.59XA

## 2011-09-14 HISTORY — DX: Nausea with vomiting, unspecified: R11.2

## 2011-09-14 HISTORY — DX: Other specified postprocedural states: Z98.890

## 2011-09-14 HISTORY — DX: Adverse effect of unspecified anesthetic, initial encounter: T41.45XA

## 2011-09-14 LAB — URINALYSIS, ROUTINE W REFLEX MICROSCOPIC
Bilirubin Urine: NEGATIVE
Glucose, UA: NEGATIVE mg/dL
Ketones, ur: NEGATIVE mg/dL
Protein, ur: NEGATIVE mg/dL
pH: 6.5 (ref 5.0–8.0)

## 2011-09-14 LAB — SURGICAL PCR SCREEN: Staphylococcus aureus: POSITIVE — AB

## 2011-09-14 MED ORDER — DEXTROSE 5 % IV SOLN
3.0000 g | INTRAVENOUS | Status: AC
  Administered 2011-09-15: 3 g via INTRAVENOUS
  Filled 2011-09-14: qty 3000

## 2011-09-14 NOTE — Pre-Procedure Instructions (Signed)
20 Keith Brown  09/14/2011   Your procedure is scheduled on:  Friday, August 30th.  Report to [facility] at 5:30 AM.  Call this number if you have problems the morning of surgery: [phone]   Remember:   Do not eat food or drink any liquid:After Midnight.     Take these medicines the morning of surgery with A SIP OF WATER:    Do not wear jewelry, make-up or nail polish.  Do not wear lotions, powders, or perfumes. You may wear deodorant.  Do not shave 48 hours prior to surgery. Men may shave face and neck.  Do not bring valuables to the hospital.  Contacts, dentures or bridgework may not be worn into surgery.  Leave suitcase in the car. After surgery it may be brought to your room.  For patients admitted to the hospital, checkout time is 11:00 AM the day of discharge.   Patients discharged the day of surgery will not be allowed to drive home.  Name and phone number of your driver: NA  Special Instructions: CHG Shower Use Special Wash: 1/2 bottle night before surgery and 1/2 bottle morning of surgery.   Please read over the following fact sheets that you were given: Pain Booklet, Coughing and Deep Breathing, Blood Transfusion Information and Surgical Site Infection Prevention

## 2011-09-15 ENCOUNTER — Inpatient Hospital Stay (HOSPITAL_COMMUNITY): Payer: BC Managed Care – PPO

## 2011-09-15 ENCOUNTER — Encounter (HOSPITAL_COMMUNITY): Payer: Self-pay | Admitting: *Deleted

## 2011-09-15 ENCOUNTER — Encounter (HOSPITAL_COMMUNITY)
Admission: RE | Disposition: A | Discharge status: Home Health | Payer: Self-pay | Source: Ambulatory Visit | Attending: Orthopaedic Surgery

## 2011-09-15 ENCOUNTER — Inpatient Hospital Stay (HOSPITAL_COMMUNITY)
Admission: RE | Admit: 2011-09-15 | Discharge: 2011-09-17 | DRG: 817 | Disposition: A | Discharge status: Home Health | Payer: BC Managed Care – PPO | Source: Ambulatory Visit | Attending: Orthopaedic Surgery | Admitting: Orthopaedic Surgery

## 2011-09-15 ENCOUNTER — Encounter (HOSPITAL_COMMUNITY): Payer: Self-pay | Admitting: Certified Registered"

## 2011-09-15 ENCOUNTER — Inpatient Hospital Stay (HOSPITAL_COMMUNITY): Payer: BC Managed Care – PPO | Admitting: Certified Registered"

## 2011-09-15 DIAGNOSIS — Z6841 Body Mass Index (BMI) 40.0 and over, adult: Secondary | ICD-10-CM

## 2011-09-15 DIAGNOSIS — K219 Gastro-esophageal reflux disease without esophagitis: Secondary | ICD-10-CM | POA: Diagnosis present

## 2011-09-15 DIAGNOSIS — Y831 Surgical operation with implant of artificial internal device as the cause of abnormal reaction of the patient, or of later complication, without mention of misadventure at the time of the procedure: Secondary | ICD-10-CM | POA: Diagnosis present

## 2011-09-15 DIAGNOSIS — T84099A Other mechanical complication of unspecified internal joint prosthesis, initial encounter: Principal | ICD-10-CM | POA: Diagnosis present

## 2011-09-15 DIAGNOSIS — T84018A Broken internal joint prosthesis, other site, initial encounter: Secondary | ICD-10-CM

## 2011-09-15 DIAGNOSIS — Z96649 Presence of unspecified artificial hip joint: Secondary | ICD-10-CM

## 2011-09-15 DIAGNOSIS — IMO0001 Reserved for inherently not codable concepts without codable children: Secondary | ICD-10-CM | POA: Diagnosis present

## 2011-09-15 DIAGNOSIS — Z01812 Encounter for preprocedural laboratory examination: Secondary | ICD-10-CM

## 2011-09-15 HISTORY — PX: TOTAL HIP REVISION: SHX763

## 2011-09-15 LAB — TYPE AND SCREEN
ABO/RH(D): A POS
Antibody Screen: POSITIVE
Unit division: 0

## 2011-09-15 LAB — GRAM STAIN

## 2011-09-15 LAB — GLUCOSE, CAPILLARY: Glucose-Capillary: 109 mg/dL — ABNORMAL HIGH (ref 70–99)

## 2011-09-15 SURGERY — TOTAL HIP REVISION
Anesthesia: General | Site: Hip | Laterality: Right | Wound class: Clean

## 2011-09-15 MED ORDER — HYDROMORPHONE HCL PF 1 MG/ML IJ SOLN
0.2500 mg | INTRAMUSCULAR | Status: DC | PRN
  Administered 2011-09-15 (×4): 0.5 mg via INTRAVENOUS

## 2011-09-15 MED ORDER — HYDROMORPHONE HCL PF 1 MG/ML IJ SOLN
INTRAMUSCULAR | Status: AC
  Filled 2011-09-15: qty 1

## 2011-09-15 MED ORDER — KETOROLAC TROMETHAMINE 30 MG/ML IJ SOLN
INTRAMUSCULAR | Status: AC
  Administered 2011-09-15: 15 mg
  Filled 2011-09-15: qty 1

## 2011-09-15 MED ORDER — METOCLOPRAMIDE HCL 10 MG PO TABS: 5.0000 mg | ORAL_TABLET | Freq: Three times a day (TID) | ORAL | Status: DC | PRN

## 2011-09-15 MED ORDER — ACETAMINOPHEN 325 MG PO TABS: 650.0000 mg | ORAL_TABLET | Freq: Four times a day (QID) | ORAL | Status: DC | PRN

## 2011-09-15 MED ORDER — OXYCODONE HCL 5 MG PO TABS
ORAL_TABLET | ORAL | Status: AC
  Filled 2011-09-15: qty 2

## 2011-09-15 MED ORDER — ACETAMINOPHEN 10 MG/ML IV SOLN
INTRAVENOUS | Status: AC
  Filled 2011-09-15: qty 100

## 2011-09-15 MED ORDER — VECURONIUM BROMIDE 10 MG IV SOLR
INTRAVENOUS | Status: DC | PRN
  Administered 2011-09-15: 2 mg via INTRAVENOUS
  Administered 2011-09-15: 1 mg via INTRAVENOUS
  Administered 2011-09-15: 2 mg via INTRAVENOUS
  Administered 2011-09-15 (×2): 1 mg via INTRAVENOUS

## 2011-09-15 MED ORDER — KETOROLAC TROMETHAMINE 15 MG/ML IJ SOLN
15.0000 mg | Freq: Four times a day (QID) | INTRAMUSCULAR | Status: AC
  Administered 2011-09-15 – 2011-09-16 (×3): 15 mg via INTRAVENOUS
  Filled 2011-09-15 (×5): qty 1

## 2011-09-15 MED ORDER — METHOCARBAMOL 500 MG PO TABS
500.0000 mg | ORAL_TABLET | Freq: Four times a day (QID) | ORAL | Status: DC | PRN
  Filled 2011-09-15: qty 1

## 2011-09-15 MED ORDER — MIDAZOLAM HCL 5 MG/5ML IJ SOLN
INTRAMUSCULAR | Status: DC | PRN
  Administered 2011-09-15: 2 mg via INTRAVENOUS

## 2011-09-15 MED ORDER — VANCOMYCIN HCL 1000 MG IV SOLR
1000.0000 mg | INTRAVENOUS | Status: DC | PRN
  Administered 2011-09-15: 1000 mg via INTRAVENOUS

## 2011-09-15 MED ORDER — ALUM & MAG HYDROXIDE-SIMETH 200-200-20 MG/5ML PO SUSP: 30.0000 mL | ORAL | Status: DC | PRN

## 2011-09-15 MED ORDER — FENTANYL CITRATE 0.05 MG/ML IJ SOLN: 50.0000 ug | INTRAMUSCULAR | Status: DC | PRN

## 2011-09-15 MED ORDER — MORPHINE SULFATE 2 MG/ML IJ SOLN: 2.0000 mg | INTRAMUSCULAR | Status: DC | PRN

## 2011-09-15 MED ORDER — LIDOCAINE HCL (CARDIAC) 20 MG/ML IV SOLN
INTRAVENOUS | Status: DC | PRN
  Administered 2011-09-15: 100 mg via INTRAVENOUS

## 2011-09-15 MED ORDER — ALBUMIN HUMAN 5 % IV SOLN
INTRAVENOUS | Status: DC | PRN
  Administered 2011-09-15: 10:00:00 via INTRAVENOUS

## 2011-09-15 MED ORDER — RIVAROXABAN 10 MG PO TABS
10.0000 mg | ORAL_TABLET | Freq: Every day | ORAL | Status: DC
  Administered 2011-09-16 – 2011-09-17 (×2): 10 mg via ORAL
  Filled 2011-09-15 (×3): qty 1

## 2011-09-15 MED ORDER — ONDANSETRON HCL 4 MG/2ML IJ SOLN
INTRAMUSCULAR | Status: DC | PRN
  Administered 2011-09-15: 4 mg via INTRAVENOUS

## 2011-09-15 MED ORDER — DOCUSATE SODIUM 100 MG PO CAPS
100.0000 mg | ORAL_CAPSULE | Freq: Two times a day (BID) | ORAL | Status: DC
  Administered 2011-09-15 – 2011-09-17 (×5): 100 mg via ORAL
  Filled 2011-09-15 (×8): qty 1

## 2011-09-15 MED ORDER — GLYCOPYRROLATE 0.2 MG/ML IJ SOLN
INTRAMUSCULAR | Status: DC | PRN
  Administered 2011-09-15: 0.2 mg via INTRAVENOUS
  Administered 2011-09-15: 0.4 mg via INTRAVENOUS

## 2011-09-15 MED ORDER — SODIUM CHLORIDE 0.9 % IR SOLN
Status: DC | PRN
  Administered 2011-09-15: 3000 mL

## 2011-09-15 MED ORDER — ZOLPIDEM TARTRATE 5 MG PO TABS: 5.0000 mg | ORAL_TABLET | Freq: Every evening | ORAL | Status: DC | PRN

## 2011-09-15 MED ORDER — CEFAZOLIN SODIUM-DEXTROSE 2-3 GM-% IV SOLR
2.0000 g | Freq: Four times a day (QID) | INTRAVENOUS | Status: AC
  Administered 2011-09-15 (×2): 2 g via INTRAVENOUS
  Filled 2011-09-15 (×2): qty 50

## 2011-09-15 MED ORDER — ROCURONIUM BROMIDE 100 MG/10ML IV SOLN
INTRAVENOUS | Status: DC | PRN
  Administered 2011-09-15: 50 mg via INTRAVENOUS

## 2011-09-15 MED ORDER — PHENYLEPHRINE HCL 10 MG/ML IJ SOLN
10.0000 mg | INTRAVENOUS | Status: DC | PRN
  Administered 2011-09-15: 25 ug/min via INTRAVENOUS

## 2011-09-15 MED ORDER — NEOSTIGMINE METHYLSULFATE 1 MG/ML IJ SOLN
INTRAMUSCULAR | Status: DC | PRN
  Administered 2011-09-15: 3 mg via INTRAVENOUS

## 2011-09-15 MED ORDER — OXYCODONE HCL 5 MG PO TABS
5.0000 mg | ORAL_TABLET | ORAL | Status: DC | PRN
Start: 2011-09-15 — End: 2011-09-17
  Administered 2011-09-15: 10 mg via ORAL
  Filled 2011-09-15: qty 1

## 2011-09-15 MED ORDER — MENTHOL 3 MG MT LOZG: 1.0000 | LOZENGE | OROMUCOSAL | Status: DC | PRN

## 2011-09-15 MED ORDER — 0.9 % SODIUM CHLORIDE (POUR BTL) OPTIME
TOPICAL | Status: DC | PRN
  Administered 2011-09-15: 1000 mL

## 2011-09-15 MED ORDER — HYDROCODONE-ACETAMINOPHEN 5-325 MG PO TABS
ORAL_TABLET | ORAL | Status: AC
  Filled 2011-09-15: qty 2

## 2011-09-15 MED ORDER — METHOCARBAMOL 100 MG/ML IJ SOLN
500.0000 mg | Freq: Four times a day (QID) | INTRAVENOUS | Status: DC | PRN
  Administered 2011-09-15: 500 mg via INTRAVENOUS
  Filled 2011-09-15: qty 5

## 2011-09-15 MED ORDER — HYDROCODONE-ACETAMINOPHEN 5-325 MG PO TABS
1.0000 | ORAL_TABLET | ORAL | Status: DC | PRN
  Administered 2011-09-15: 1 via ORAL
  Administered 2011-09-15: 2 via ORAL
  Administered 2011-09-15 – 2011-09-16 (×5): 1 via ORAL
  Administered 2011-09-17 (×2): 2 via ORAL
  Filled 2011-09-15: qty 2
  Filled 2011-09-15 (×2): qty 1
  Filled 2011-09-15 (×2): qty 2
  Filled 2011-09-15 (×4): qty 1

## 2011-09-15 MED ORDER — PHENOL 1.4 % MT LIQD: 1.0000 | OROMUCOSAL | Status: DC | PRN

## 2011-09-15 MED ORDER — SUFENTANIL CITRATE 50 MCG/ML IV SOLN
INTRAVENOUS | Status: DC | PRN
  Administered 2011-09-15: 20 ug via INTRAVENOUS
  Administered 2011-09-15: 5 ug via INTRAVENOUS
  Administered 2011-09-15 (×2): 10 ug via INTRAVENOUS
  Administered 2011-09-15: 5 ug via INTRAVENOUS

## 2011-09-15 MED ORDER — PROPOFOL 10 MG/ML IV BOLUS
INTRAVENOUS | Status: DC | PRN
  Administered 2011-09-15: 200 mg via INTRAVENOUS

## 2011-09-15 MED ORDER — MUPIROCIN 2 % EX OINT
TOPICAL_OINTMENT | Freq: Two times a day (BID) | CUTANEOUS | Status: DC
  Administered 2011-09-15: 1 via NASAL
  Filled 2011-09-15 (×2): qty 22

## 2011-09-15 MED ORDER — ONDANSETRON HCL 4 MG PO TABS: 4.0000 mg | ORAL_TABLET | Freq: Four times a day (QID) | ORAL | Status: DC | PRN

## 2011-09-15 MED ORDER — LACTATED RINGERS IV SOLN
INTRAVENOUS | Status: DC | PRN
  Administered 2011-09-15 (×2): via INTRAVENOUS

## 2011-09-15 MED ORDER — MIDAZOLAM HCL 2 MG/2ML IJ SOLN: 1.0000 mg | INTRAMUSCULAR | Status: DC | PRN

## 2011-09-15 MED ORDER — ONDANSETRON HCL 4 MG/2ML IJ SOLN
4.0000 mg | Freq: Four times a day (QID) | INTRAMUSCULAR | Status: DC | PRN
  Administered 2011-09-15: 4 mg via INTRAVENOUS
  Filled 2011-09-15: qty 2

## 2011-09-15 MED ORDER — DIPHENHYDRAMINE HCL 12.5 MG/5ML PO ELIX: 12.5000 mg | ORAL_SOLUTION | ORAL | Status: DC | PRN

## 2011-09-15 MED ORDER — METOCLOPRAMIDE HCL 5 MG/ML IJ SOLN: 5.0000 mg | Freq: Three times a day (TID) | INTRAMUSCULAR | Status: DC | PRN

## 2011-09-15 MED ORDER — PROMETHAZINE HCL 25 MG/ML IJ SOLN: 6.2500 mg | INTRAMUSCULAR | Status: DC | PRN

## 2011-09-15 MED ORDER — PHENYLEPHRINE HCL 10 MG/ML IJ SOLN
INTRAMUSCULAR | Status: DC | PRN
  Administered 2011-09-15: 80 ug via INTRAVENOUS
  Administered 2011-09-15: 120 ug via INTRAVENOUS
  Administered 2011-09-15: 80 ug via INTRAVENOUS

## 2011-09-15 MED ORDER — ACETAMINOPHEN 10 MG/ML IV SOLN
1000.0000 mg | Freq: Once | INTRAVENOUS | Status: AC
  Administered 2011-09-15: 1000 mg via INTRAVENOUS
  Filled 2011-09-15: qty 100

## 2011-09-15 MED ORDER — VANCOMYCIN HCL IN DEXTROSE 1-5 GM/200ML-% IV SOLN
INTRAVENOUS | Status: AC
  Filled 2011-09-15: qty 200

## 2011-09-15 MED ORDER — SODIUM CHLORIDE 0.9 % IV SOLN: INTRAVENOUS | Status: DC

## 2011-09-15 MED ORDER — ACETAMINOPHEN 650 MG RE SUPP: 650.0000 mg | Freq: Four times a day (QID) | RECTAL | Status: DC | PRN

## 2011-09-15 SURGICAL SUPPLY — 69 items
BLADE SURG 10 STRL SS (BLADE) ×2 IMPLANT
BLADE SURG ROTATE 9660 (MISCELLANEOUS) IMPLANT
BRUSH FEMORAL CANAL (MISCELLANEOUS) IMPLANT
CERAMIC TS 36PLUS12 (Orthopedic Implant) ×2 IMPLANT
CLOTH BEACON ORANGE TIMEOUT ST (SAFETY) ×2 IMPLANT
CONT SPEC 4OZ CLIKSEAL STRL BL (MISCELLANEOUS) ×1 IMPLANT
COVER BACK TABLE 24X17X13 BIG (DRAPES) IMPLANT
COVER SURGICAL LIGHT HANDLE (MISCELLANEOUS) ×2 IMPLANT
CUP ACET PINNACLE SECTR 60MM (Hips) IMPLANT
DRAPE HIP W/POCKET STRL (DRAPE) ×2 IMPLANT
DRAPE INCISE IOBAN 66X45 STRL (DRAPES) IMPLANT
DRAPE INCISE IOBAN 85X60 (DRAPES) ×4 IMPLANT
DRAPE PROXIMA HALF (DRAPES) ×1 IMPLANT
DRAPE U-SHAPE 47X51 STRL (DRAPES) ×2 IMPLANT
DRILL BIT 7/64X5 (BIT) ×2 IMPLANT
DRSG MEPILEX BORDER 4X12 (GAUZE/BANDAGES/DRESSINGS) ×1 IMPLANT
DRSG MEPILEX BORDER 4X8 (GAUZE/BANDAGES/DRESSINGS) IMPLANT
DURAPREP 26ML APPLICATOR (WOUND CARE) ×2 IMPLANT
ELECT BLADE 4.0 EZ CLEAN MEGAD (MISCELLANEOUS) ×4
ELECT BLADE 6.5 EXT (BLADE) IMPLANT
ELECT CAUTERY BLADE 6.4 (BLADE) ×2 IMPLANT
ELECT REM PT RETURN 9FT ADLT (ELECTROSURGICAL) ×2
ELECTRODE BLDE 4.0 EZ CLN MEGD (MISCELLANEOUS) IMPLANT
ELECTRODE REM PT RTRN 9FT ADLT (ELECTROSURGICAL) ×1 IMPLANT
ELIMINATOR HOLE APEX DEPUY (Hips) ×1 IMPLANT
EVACUATOR 1/8 PVC DRAIN (DRAIN) IMPLANT
FACESHIELD LNG OPTICON STERILE (SAFETY) ×6 IMPLANT
GAUZE XEROFORM 5X9 LF (GAUZE/BANDAGES/DRESSINGS) ×1 IMPLANT
GLOVE BIOGEL PI IND STRL 8 (GLOVE) ×1 IMPLANT
GLOVE BIOGEL PI INDICATOR 8 (GLOVE) ×2
GLOVE ORTHO TXT STRL SZ7.5 (GLOVE) ×3 IMPLANT
GOWN PREVENTION PLUS LG XLONG (DISPOSABLE) ×2 IMPLANT
GOWN PREVENTION PLUS XLARGE (GOWN DISPOSABLE) ×3 IMPLANT
GOWN STRL NON-REIN LRG LVL3 (GOWN DISPOSABLE) ×3 IMPLANT
HANDPIECE INTERPULSE COAX TIP (DISPOSABLE) ×2
INSERT CERAMIC TS 36PLUS12 (Orthopedic Implant) IMPLANT
KIT BASIN OR (CUSTOM PROCEDURE TRAY) ×2 IMPLANT
KIT ROOM TURNOVER OR (KITS) ×2 IMPLANT
MANIFOLD NEPTUNE II (INSTRUMENTS) ×2 IMPLANT
NS IRRIG 1000ML POUR BTL (IV SOLUTION) ×2 IMPLANT
PACK TOTAL JOINT (CUSTOM PROCEDURE TRAY) ×2 IMPLANT
PAD ARMBOARD 7.5X6 YLW CONV (MISCELLANEOUS) ×4 IMPLANT
PASSER SUT SWANSON 36MM LOOP (INSTRUMENTS) ×2 IMPLANT
PENCIL BUTTON HOLSTER BLD 10FT (ELECTRODE) ×1 IMPLANT
PINNSECTOR W/GRIP ACE CUP 60MM (Hips) ×2 IMPLANT
SCREW 6.5MMX25MM (Screw) ×2 IMPLANT
SCREW PINN CAN 6.5X20 (Screw) ×1 IMPLANT
SET HNDPC FAN SPRY TIP SCT (DISPOSABLE) IMPLANT
SPONGE LAP 18X18 X RAY DECT (DISPOSABLE) ×3 IMPLANT
SPONGE LAP 4X18 X RAY DECT (DISPOSABLE) IMPLANT
STAPLER VISISTAT 35W (STAPLE) ×3 IMPLANT
SUCTION FRAZIER TIP 10 FR DISP (SUCTIONS) ×2 IMPLANT
SUT ETHIBOND NAB CT1 #1 30IN (SUTURE) ×5 IMPLANT
SUT TICRON (SUTURE) ×4 IMPLANT
SUT VIC AB 0 CT1 27 (SUTURE) ×4
SUT VIC AB 0 CT1 27XBRD ANBCTR (SUTURE) IMPLANT
SUT VIC AB 1 CT1 27 (SUTURE) ×2
SUT VIC AB 1 CT1 27XBRD ANBCTR (SUTURE) IMPLANT
SUT VIC AB 1 CTB1 27 (SUTURE) ×4 IMPLANT
SUT VIC AB 2-0 CT1 27 (SUTURE) ×10
SUT VIC AB 2-0 CT1 TAPERPNT 27 (SUTURE) ×2 IMPLANT
SWAB COLLECTION DEVICE MRSA (MISCELLANEOUS) ×1 IMPLANT
SWAB CULTURE LIQUID MINI MALE (MISCELLANEOUS) ×1 IMPLANT
TOWEL OR 17X24 6PK STRL BLUE (TOWEL DISPOSABLE) ×2 IMPLANT
TOWEL OR 17X26 10 PK STRL BLUE (TOWEL DISPOSABLE) ×2 IMPLANT
TOWER CARTRIDGE SMART MIX (DISPOSABLE) IMPLANT
TRAY FOLEY CATH 14FR (SET/KITS/TRAYS/PACK) ×1 IMPLANT
WATER STERILE IRR 1000ML POUR (IV SOLUTION) ×6 IMPLANT
poly liner +4 neutral 36x60mm (Liner) ×1 IMPLANT

## 2011-09-15 NOTE — Transfer of Care (Signed)
Immediate Anesthesia Transfer of Care Note  Patient: Keith Brown  Procedure(s) Performed: Procedure(s) (LRB): TOTAL HIP REVISION (Right)  Patient Location: PACU  Anesthesia Type: General  Level of Consciousness: awake, alert  and oriented  Airway & Oxygen Therapy: Patient Spontanous Breathing and Patient connected to nasal cannula oxygen  Post-op Assessment: Report given to PACU RN, Post -op Vital signs reviewed and stable and Patient moving all extremities  Post vital signs: Reviewed and stable  Complications: No apparent anesthesia complications

## 2011-09-15 NOTE — Brief Op Note (Signed)
09/15/2011  10:49 AM  PATIENT:  Keith Brown  56 y.o. male  PRE-OPERATIVE DIAGNOSIS:  Failed Right hip acetabular component  POST-OPERATIVE DIAGNOSIS:  Failed Right hip acetabular component  PROCEDURE:  Procedure(s) (LRB): TOTAL HIP REVISION (Right)  SURGEON:  Surgeon(s) and Role:    * Kathryne Hitch, MD - Primary    * Eldred Manges, MD - Assisting  PHYSICIAN ASSISTANT:   ASSISTANTS: Annell Greening, MD   Maud Deed, PA-C   ANESTHESIA:   general  EBL:  Total I/O In: 1500 [I.V.:1000; IV Piggyback:500] Out: 750 [Urine:350; Blood:400]  BLOOD ADMINISTERED:none  DRAINS: none   LOCAL MEDICATIONS USED:  NONE  SPECIMEN:  Source of Specimen:  Hip capsule  DISPOSITION OF SPECIMEN:  PATHOLOGY  COUNTS:  YES  TOURNIQUET:  * No tourniquets in log *  DICTATION: .Other Dictation: Dictation Number [id number]  PLAN OF CARE: Admit to inpatient   PATIENT DISPOSITION:  PACU - hemodynamically stable.   Delay start of Pharmacological VTE agent (>24hrs) due to surgical blood loss or risk of bleeding: no

## 2011-09-15 NOTE — Anesthesia Preprocedure Evaluation (Signed)
Anesthesia Evaluation  Patient identified by MRN, date of birth, ID band Patient awake    Reviewed: Allergy & Precautions, H&P , NPO status , Patient's Chart, lab work & pertinent test results  History of Anesthesia Complications (+) PONV  Airway Mallampati: II TM Distance: >3 FB Neck ROM: Full    Dental   Pulmonary  breath sounds clear to auscultation  Pulmonary exam normal       Cardiovascular Rhythm:Regular Rate:Normal     Neuro/Psych  Neuromuscular disease    GI/Hepatic GERD-  ,  Endo/Other  Well Controlled, Type obesity  Renal/GU      Musculoskeletal  (+) Fibromyalgia -  Abdominal (+) + obese,   Peds  Hematology   Anesthesia Other Findings   Reproductive/Obstetrics                           Anesthesia Physical Anesthesia Plan  ASA: III  Anesthesia Plan: General   Post-op Pain Management:    Induction: Intravenous  Airway Management Planned: Oral ETT  Additional Equipment:   Intra-op Plan:   Post-operative Plan: Extubation in OR  Informed Consent: I have reviewed the patients History and Physical, chart, labs and discussed the procedure including the risks, benefits and alternatives for the proposed anesthesia with the patient or authorized representative who has indicated his/her understanding and acceptance.     Plan Discussed with: CRNA and Surgeon  Anesthesia Plan Comments:         Anesthesia Quick Evaluation

## 2011-09-15 NOTE — Anesthesia Postprocedure Evaluation (Signed)
  Anesthesia Post-op Note  Patient: Keith Brown  Procedure(s) Performed: Procedure(s) (LRB): TOTAL HIP REVISION (Right)  Patient Location: PACU  Anesthesia Type: General  Level of Consciousness: awake  Airway and Oxygen Therapy: Patient Spontanous Breathing  Post-op Pain: mild  Post-op Assessment: Post-op Vital signs reviewed, Patient's Cardiovascular Status Stable, Respiratory Function Stable, Patent Airway, No signs of Nausea or vomiting and Pain level controlled  Post-op Vital Signs: stable  Complications: No apparent anesthesia complications

## 2011-09-15 NOTE — OR Nursing (Signed)
1030:  Explant numbers per scrub tech=head [id number]; liner [id number].

## 2011-09-15 NOTE — H&P (Signed)
Keith Brown is an 56 y.o. male.   Chief Complaint:   Right hip pain with failed total hip acetabular component HPI:   56 yo male well-known to me.  In January 2009, underwent a right total hip replacement.  The acetabular component is a re-called ASR component from DePuy.  Over the last several months he has experienced increased pain.  Metal ions have also increased on blood tests.  A recent MARS MRI shows a large fluid collection around the hip suspicious for metallosis or even pseudotumor.  It is recommended that he undergo exploration of the right hip with excision of the cup and revision to a new cup.  The risks are blood loss, nerve injury, infection, DVT, bone loss, etc.  Past Medical History  Diagnosis Date  . Allergy     fire ants  . Stone, kidney   . Complication of anesthesia   . PONV (postoperative nausea and vomiting)   . GERD (gastroesophageal reflux disease)   . Fibromyalgia     Past Surgical History  Procedure Date  . Mr hips bilateral     2009&2010  . Back surgery     laminectomy L5-S1  . Cystoscopy kidney w/ ureteral guide wire     lithotripsy  . Cystoscopy   . Knee arthroscopy   . Laminectomy 1999  . Colonscopy     polyps    Family History  Problem Relation Age of Onset  . Colon cancer Father    Social History:  reports that he has never smoked. He has never used smokeless tobacco. He reports that he drinks about 4.2 ounces of alcohol per week. He reports that he does not use illicit drugs.  Allergies: No Known Allergies  No prescriptions prior to admission    Results for orders placed during the hospital encounter of 09/15/11 (from the past 48 hour(s))  GLUCOSE, CAPILLARY     Status: Abnormal   Collection Time   09/15/11  6:10 AM      Component Value Range Comment   Glucose-Capillary 109 (*) 70 - 99 mg/dL    Comment 1 Notify RN      No results found.  Review of Systems  All other systems reviewed and are negative.    Blood pressure 139/84,  pulse 67, temperature 98.2 F (36.8 C), temperature source Oral, resp. rate 18, SpO2 96.00%. Physical Exam  Constitutional: He is oriented to person, place, and time. He appears well-developed and well-nourished.  HENT:  Head: Normocephalic and atraumatic.  Eyes: EOM are normal. Pupils are equal, round, and reactive to light.  Neck: Normal range of motion. Neck supple.  Cardiovascular: Normal rate and regular rhythm.   Respiratory: Effort normal and breath sounds normal.  GI: Soft. Bowel sounds are normal.  Musculoskeletal:       Right hip: He exhibits tenderness.  Neurological: He is alert and oriented to person, place, and time.  Skin: Skin is warm and dry.  Psychiatric: He has a normal mood and affect.     Assessment/Plan Failed right hip acetabular component 1) to the OR for revision of right hip acetabular component  BLACKMAN,CHRISTOPHER Y 09/15/2011, 7:18 AM

## 2011-09-15 NOTE — Progress Notes (Signed)
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   Anticipated DC Date:     Anticipated DC Plan:  HOME W HOME HEALTH SERVICES      DC Planning Services  CM consult      [facility] Choice  HOME HEALTH   Choice offered to / List presented to:          [facility] arranged  HH-2 PT      [facility] agency  [facility]   Status of service:  In process, will continue to follow Medicare Important Message given?   (If response is "NO", the following Medicare IM given date fields will be blank) Date Medicare IM given:   Date Additional Medicare IM given:    Discharge Disposition:    Per UR Regulation:    If discussed at Long Length of Stay Meetings, dates discussed:    Comments:

## 2011-09-15 NOTE — OR Nursing (Signed)
0900:  Stat gram stain=no wbc's, no organisms per Selena Batten in lab.

## 2011-09-15 NOTE — Preoperative (Signed)
Beta Blockers   Reason not to administer Beta Blockers:Not Applicable 

## 2011-09-16 LAB — BASIC METABOLIC PANEL
BUN: 20 mg/dL (ref 6–23)
Chloride: 106 mEq/L (ref 96–112)
GFR calc Af Amer: 76 mL/min — ABNORMAL LOW (ref >90)
GFR calc non Af Amer: 65 mL/min — ABNORMAL LOW (ref >90)
Potassium: 3.6 mEq/L (ref 3.5–5.1)
Sodium: 139 mEq/L (ref 135–145)

## 2011-09-16 LAB — CBC
MCHC: 32.8 g/dL (ref 30.0–36.0)
Platelets: 127 10*3/uL — ABNORMAL LOW (ref 150–400)
RDW: 13.7 % (ref 11.5–15.5)
WBC: 9.6 10*3/uL (ref 4.0–10.5)

## 2011-09-16 NOTE — Progress Notes (Addendum)
Occupational Therapy Evaluation Patient Details Name: Keith Brown MRN: [id number] DOB: 04-11-1955 Today's Date: 09/16/2011 Time: 1420-1450 OT Time Calculation (min): 30 min  OT Assessment / Plan / Recommendation Clinical Impression  56 yo s/p R THA revision. PT has all DME and AE for ADL. Educataed pt/wife on availability of tub bench to [employer] transfers easier if desired. Reviewed Ant hip precautions during  ADL. Written handout given. No further OT needed.      OT Assessment  Patient does not need any further OT services    Follow Up Recommendations  No OT follow up    Barriers to Discharge  none    Equipment Recommendations  Rolling walker with 5" wheels   Recommendations for Other Services  none  Frequency    eval only   Precautions / Restrictions Precautions Precautions: Anterior Hip Precaution Booklet Issued: Yes (comment) Restrictions Weight Bearing Restrictions: Yes RLE Weight Bearing: Weight bearing as tolerated   Pertinent Vitals/Pain 2    ADL  Grooming: Set up;Simulated Where Assessed - Grooming: Unsupported sitting Upper Body Bathing: Simulated;Set up Where Assessed - Upper Body Bathing: Unsupported sitting Lower Body Bathing: Simulated;Minimal assistance Where Assessed - Lower Body Bathing: Unsupported sit to stand Upper Body Dressing: Simulated;Set up Where Assessed - Upper Body Dressing: Unsupported sitting Lower Body Dressing: Simulated;Minimal assistance Where Assessed - Lower Body Dressing: Unsupported sit to stand Toilet Transfer: Simulated;Supervision/safety Toilet Transfer Method: Sit to Barista: Other (comment) (bed - chair) Toileting - Clothing Manipulation and Hygiene: Simulated;Supervision/safety Where Assessed - Toileting Clothing Manipulation and Hygiene: Standing Tub/Shower Transfer: Simulated;Minimal assistance Tub/Shower Transfer Method: Stand pivot;Ambulating Transfers/Ambulation Related to ADLs:  Supervision ADL Comments: Pt has necessary AE for ADL and wife will be able to assist as needed during recovery. Pt given elastic shoestrings.    OT Diagnosis:    OT Problem List:   OT Treatment Interventions:     OT Goals Acute Rehab OT Goals OT Goal Formulation:  (eval only)  Visit Information  Last OT Received On: 09/16/11    Subjective Data      Prior Functioning  Vision/Perception  Home Living Lives With: Spouse Available Help at Discharge: Family Type of Home: House Home Access: Stairs to enter Secretary/administrator of Steps: 1 Entrance Stairs-Rails: None Home Layout: Two level Bathroom Shower/Tub: Forensic scientist: Handicapped height Bathroom Accessibility: Yes How Accessible: Accessible via walker Home Adaptive Equipment: Grab bars in shower;Hand-held shower hose;Long-handled shoehorn;Long-handled sponge;Raised toilet seat with rails;Reacher;Shower chair with back;Sock aid;Walker - rolling Prior Function Level of Independence: Independent Able to Take Stairs?: Yes Driving: Yes Vocation: Full time employment Communication Communication: No difficulties Dominant Hand: Right      Cognition  Overall Cognitive Status: Appears within functional limits for tasks assessed/performed Arousal/Alertness: Awake/alert Orientation Level: Appears intact for tasks assessed Behavior During Session: [facility] for tasks performed    Extremity/Trunk Assessment Right Upper Extremity Assessment RUE ROM/Strength/Tone: Within functional levels Left Upper Extremity Assessment LUE ROM/Strength/Tone: Within functional levels Trunk Assessment Trunk Assessment: Normal   Mobility    Bed Mobility Bed Mobility: Supine to Sit;Sitting - Scoot to Edge of Bed;Sit to Supine Supine to Sit: 6: Modified independent (Device/Increase time) Transfers Transfers: Sit to Stand;Stand to Sit Sit to Stand: 5: Supervision;With upper extremity assist;From chair/3-in-1 Stand to  Sit: 5: Supervision;With upper extremity assist;To bed Details for Transfer Assistance: good technique from earlier session with PT       Exercise     Balance  [facility]  End of Session OT - End of Session Activity Tolerance: Patient tolerated treatment well Patient left: in chair;with call bell/phone within reach;with family/visitor present  GO     Naryah Clenney,HILLARY 09/16/2011, 3:09 PM [facility], OTR/L  [phone] 09/16/2011

## 2011-09-16 NOTE — Evaluation (Signed)
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 Pt doing well with mobility and should be appropriate for d/c to home.       PT Assessment  Patient needs continued PT services    Follow Up Recommendations  Home health PT    Barriers to Discharge None      Equipment Recommendations  None recommended by PT    Recommendations for Other Services     Frequency 7X/week    Precautions / Restrictions Precautions Precautions: Anterior Hip Precaution Booklet Issued: Yes (comment) Restrictions Weight Bearing Restrictions: Yes RLE Weight Bearing: Weight bearing as tolerated   Pertinent Vitals/Pain Pt reports pain in R hip 3-4/10.  Premedicated.      Mobility  Bed Mobility Bed Mobility: Supine to Sit;Sitting - Scoot to Edge of Bed Supine to Sit: 6: Modified independent (Device/Increase time) Sitting - Scoot to Edge of Bed: 6: Modified independent (Device/Increase time) Details for Bed Mobility Assistance: Pt assisting R LE with his UE and pulling on Rail.  Transfers Transfers: Sit to Stand;Stand to Sit Sit to Stand: From bed;With upper extremity assist;5: Supervision Stand to Sit: 5: Supervision;To bed;With upper extremity assist Details for Transfer Assistance: Cueing for hand and R LE positioning.  Ambulation/Gait Ambulation/Gait Assistance: 5: Supervision Ambulation Distance (Feet): 100 Feet Assistive device: Rolling walker Ambulation/Gait Assistance Details: Cueing for upright trunk and cervical posture.   Gait Pattern: Step-to pattern Stairs: No Wheelchair Mobility Wheelchair Mobility: No    Exercises Total Joint Exercises Ankle Circles/Pumps: AROM;Both;10 reps Heel Slides: Right;AROM;10 reps;Supine Marching in  Standing: 5 reps;Right;Standing   PT Diagnosis: Difficulty walking;Abnormality of gait;Acute pain  PT Problem List: Decreased mobility;Pain PT Treatment Interventions: Gait training;DME instruction;Stair training;Therapeutic exercise;Functional mobility training;Therapeutic activities;Patient/family education   PT Goals Acute Rehab PT Goals PT Goal Formulation: With patient Time For Goal Achievement: 09/23/11 Potential to Achieve Goals: Good Pt will go Supine/Side to Sit: Independently PT Goal: Supine/Side to Sit - Progress: Goal set today Pt will go Sit to Supine/Side: Independently PT Goal: Sit to Supine/Side - Progress: Goal set today Pt will Transfer Bed to Chair/Chair to Bed: with modified independence PT Transfer Goal: Bed to Chair/Chair to Bed - Progress: Goal set today Pt will Ambulate: >150 feet;with modified independence;with rolling walker PT Goal: Ambulate - Progress: Goal set today Pt will Go Up / Down Stairs: 3-5 stairs;with modified independence PT Goal: Up/Down Stairs - Progress: Goal set today Pt will Perform Home Exercise Program: Independently PT Goal: Perform Home Exercise Program - Progress: Goal set today  Visit Information  Last PT Received On: 09/16/11    Subjective Data      Prior Functioning  Home Living Lives With: Spouse Available Help at Discharge: Family Type of Home: House Home Access: Stairs to enter Prior Function Level of Independence: Independent Able to Take Stairs?: Yes Communication Communication: No difficulties    Cognition  Overall Cognitive Status: Appears within functional limits for tasks assessed/performed Arousal/Alertness: Awake/alert Orientation Level: Appears intact for tasks assessed Behavior During Session: [facility] for tasks performed    Extremity/Trunk Assessment Right Upper Extremity Assessment RUE ROM/Strength/Tone: Within functional levels Left Upper Extremity Assessment LUE ROM/Strength/Tone: Within functional  levels Right Lower Extremity Assessment RLE ROM/Strength/Tone: Within functional levels;Deficits;Due to pain RLE ROM/Strength/Tone Deficits: Pain and weakness secondary to  surgery.  RLE Coordination: WFL - gross motor Left Lower Extremity Assessment LLE ROM/Strength/Tone: Within functional levels Trunk Assessment Trunk Assessment: Normal   Balance Balance Balance Assessed: No  End of Session PT - End of Session Equipment Utilized During Treatment: Gait belt Activity Tolerance: Patient tolerated treatment well Patient left: in chair;with call bell/phone within reach Nurse Communication: Mobility status  GP     Tamra Koos 09/16/2011, 12:56 PM  [place] L. Marky Buresh DPT [phone]

## 2011-09-16 NOTE — Op Note (Signed)
NAMETAMMIE, ELLSWORTH NO.:  [id number]  MEDICAL RECORD NO.:  [id number]  LOCATION:  [id number]                        FACILITY:  [facility]  PHYSICIAN:  Vanita Panda. 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  PROCEDURE:  Revision arthroplasty of right hip acetabular component.  IMPLANT: 1. Removal of DePuy ASR acetabular component and metal femoral head. 2. Placement of DePuy Sector Gription acetabular component size 60     with 3 screws, size 36+ 4 neutral polyethylene liner, size 36+ 12     ceramic femoral head.  FINDINGS:  Evidence of significant metallosis of right hip socket with no evidence of pseudotumor and evidence of only mild bony ingrowth on the backside of the acetabulum, Gram stain negative for organisms or white blood cells.  SURGEON:  Vanita Panda. Magnus Ivan, M.D.  ASSISTANT: 1. Annell Greening, M 2. Maud Deed, PA-C  ANESTHESIA:  General.  BLOOD LOSS:  400 mL.  ANTIBIOTICS:  Vancomycin 1 g IV and 2 g IV Ancef after cultures obtained.  COMPLICATIONS:  None.  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 This was DePuy components and the acetabular component was then ASR component with a large metal head that has since been recalled.  For the past 4 years, he has never shown evidence of loosening, but recently started developing just a little bit more hip pain than usual.  His cobalt chrome/metal ion levels had increased and I obtained a MRI of his pelvis that showed a large fluid collection around the components. Given all those  findings, we recommended that he undergo excision of the acetabular component with hopefully just replacing with new component with a polyethylene liner and a ceramic head.  He understands in full detail the risks and benefits of this surgery including the risk of bone loss, acute blood loss anemia, nerve injury, and the need for potential surgery in the future.  PROCEDURE DESCRIPTION:  After informed consent was obtained, the appropriate right hip was marked.  He was brought to the operating room, placed supine on the operating table.  General anesthesia was then obtained.  A Foley catheter was placed.  The knee was turned into the lateral decubitus position with the right operative hip up with bending at the knees and ankle and appropriate padding throughout.  An axillary roll was placed as well.  His right hip was then prepped and draped with DuraPrep and sterile drapes including sterile stockinette.  Time-out was called to identify correct patient, correct right hip.  We then made an incision directly over his old incision, dissected down to the iliotibial band.  The soft tissue from the skin down to the iliotibial band was nice and intact and pristine.  I then divided the iliotibial band, and we found his gluteus medius and minimus looking great from  his previous surgery.  There was no evidence of muscle atrophy at all and the adductors looked great.  I then took the abductors down as a sleeve and reflected these anteriorly to gain exposure to the hip capsule.  Hip capsule was also intact.  Once the hip capsule had been thickened, I encountered a large fluid collection and so we obtained Gram stain from this and sent it for Gram stain and culture and sent tissue to pathology for frozen section.  We then gave antibiotics.  I found evidence of metallosis in the soft tissue but no pseudotumor.  I divided the hip capsule and removed significant amount of capsule secondary to  scar tissue.  Once we were able to do this, we dislocated the hip and removed the previous hip ball.  I then assessed the femoral component and found it to be with strong bony ingrowth and no evidence of motion or failure of this at all.  We then used osteotomes as well as bone cutting guides around the acetabulum to remove it in its entirety.  Once I got it out which was quite difficult to remove, it did show some bony ingrowth on the back of the cup and around the edges showing that it was in a stable position and it showed no evidence of wear on the head and no evidence of wear on the socket other than where I had to chisel it out.  We then reamed with a size 56, then 58 and 59 reamers to get good symmetric bone and bleeding of the bone.  I trialed 58 and then followed 60 and I felt the 60 would be the appropriate fit.  We tried to get as much of inclination as possible.  Some of this was obstructed by soft tissues and we put the real DePuy Sector Gription acetabular component size 60. Once I got this placed, I placed 3 screws to further secure it and then the real 36+ 4 polyethylene liner.  I then trialed several different hip balls, was pleased with stability as far as the hip ball goes and with the trial of hip ball in place, we brought x-ray into the room, so I could assess the leg length and position of the acetabulum.  I was pleased with my version and I felt my inclination was slightly vertical but this is where the cup needed to be based on what his bone stock was giving [place].  I did feel comfortable with his stability on exam even with his muscles completely relaxed.  I then dislocated the hip again and placed the real 36+ 12 ceramic head and we brought her back in the acetabulum.  It was nice and stable.  We then copiously irrigated the soft tissues another time with additional 3 L normal saline solution.  I closed the remnants of the joint capsule with #1 Ethibond suture.   I reapproximated the gluteus medius and minimus tendons to the greater trochanter with #1 Ethibond.  We closed the iliotibial band with #1 Vicryl suture interrupted followed by 0 Vicryl in the deep tissue, 2-0 Vicryl in the subcutaneous tissue, and staples on the skin.  He was then rolled back in the supine position, awakened, extubated, and taken to recovery room in stable condition.  All final counts were correct. There were no complications noted.     Vanita Panda. Magnus Ivan, M.D.     CYB/MEDQ  D:  09/15/2011  T:  09/16/2011  Job:  [id number]

## 2011-09-16 NOTE — Progress Notes (Signed)
Physical Therapy Treatment Patient Details Name: Keith Brown MRN: [id number] DOB: 11/18/1955 Today's Date: 09/16/2011 Time: 4098-1191 PT Time Calculation (min): 26 min  PT Assessment / Plan / Recommendation Comments on Treatment Session  Pt mobility progressing well.  Presents with quad and hip abductor weakness.     Follow Up Recommendations  Home health PT    Barriers to Discharge        Equipment Recommendations  None recommended by OT    Recommendations for Other Services    Frequency 7X/week   Plan Discharge plan remains appropriate;Frequency remains appropriate    Precautions / Restrictions Precautions Precautions: Anterior Hip Precaution Booklet Issued: Yes (comment) Restrictions Weight Bearing Restrictions: Yes RLE Weight Bearing: Weight bearing as tolerated   Pertinent Vitals/Pain Pt reporting pain 6/10 in post R hip.  Premedicated.     Mobility  Bed Mobility Bed Mobility: Supine to Sit;Sitting - Scoot to Edge of Bed;Sit to Supine Supine to Sit: 6: Modified independent (Device/Increase time) Sitting - Scoot to Edge of Bed: 6: Modified independent (Device/Increase time) Sit to Supine: 5: Supervision;HOB flat Details for Bed Mobility Assistance: Pt assisting R LE with Leg lifter.  Transfers Transfers: Sit to Stand;Stand to Sit Sit to Stand: 5: Supervision;With upper extremity assist;From chair/3-in-1 Stand to Sit: 5: Supervision;With upper extremity assist;To bed Details for Transfer Assistance: good technique from earlier session with PT Ambulation/Gait Ambulation/Gait Assistance: 6: Modified independent (Device/Increase time) Ambulation Distance (Feet): 100 Feet Assistive device: Rolling walker Gait Pattern: Step-through pattern;Decreased stance time - right Stairs: No Wheelchair Mobility Wheelchair Mobility: No    Exercises Total Joint Exercises Ankle Circles/Pumps: AROM;Both;10 reps Quad Sets: Right;10 reps;Supine (5 sec hold then 5 second  overpressure.) Gluteal Sets: 10 reps;Both;Strengthening;Supine Towel Squeeze: 10 reps;Strengthening;Both;Supine Short Arc Quad: 10 reps;Right;AROM;Supine Heel Slides: Right;AROM;10 reps;Supine Hip ABduction/ADduction: 10 reps;Right;Strengthening;Supine;Other (comment) (Isometric to adhere to hip precautions. )   PT Diagnosis:    PT Problem List:   PT Treatment Interventions:     PT Goals Acute Rehab PT Goals PT Goal Formulation: With patient Time For Goal Achievement: 09/23/11 Potential to Achieve Goals: Good Pt will go Supine/Side to Sit: Independently PT Goal: Supine/Side to Sit - Progress: Progressing toward goal Pt will go Sit to Supine/Side: Independently PT Goal: Sit to Supine/Side - Progress: Progressing toward goal Pt will Transfer Bed to Chair/Chair to Bed: with modified independence PT Transfer Goal: Bed to Chair/Chair to Bed - Progress: Progressing toward goal Pt will Ambulate: >150 feet;with modified independence;with rolling walker PT Goal: Ambulate - Progress: Progressing toward goal Pt will Go Up / Down Stairs: 3-5 stairs;with modified independence PT Goal: Up/Down Stairs - Progress: Progressing toward goal Pt will Perform Home Exercise Program: Independently PT Goal: Perform Home Exercise Program - Progress: Progressing toward goal  Visit Information  Last PT Received On: 09/16/11 Assistance Needed: +1    Subjective Data  Subjective: I feel pretty good.  My quads and abductors are weak.   Patient Stated Goal: D/C to home tomorrow.    Cognition  Overall Cognitive Status: Appears within functional limits for tasks assessed/performed Arousal/Alertness: Awake/alert Orientation Level: Appears intact for tasks assessed Behavior During Session: [facility] for tasks performed    Balance  Balance Balance Assessed: No  End of Session PT - End of Session Equipment Utilized During Treatment: Gait belt Activity Tolerance: Patient tolerated treatment well Patient left: in  chair;with call bell/phone within reach Nurse Communication: Mobility status   GP     [name] 09/16/2011, 4:29 PM [name] L.  Shaun Zuccaro DPT [phone]

## 2011-09-16 NOTE — Progress Notes (Signed)
Subjective: 1 Day Post-Op Procedure(s) (LRB): TOTAL HIP REVISION (Right) Patient reports pain as mild.    Objective: Vital signs in last 24 hours: Temp:  [97.5 F (36.4 C)-98.7 F (37.1 C)] 98.7 F (37.1 C) (08/31 0546) Pulse Rate:  [61-79] 64  (08/31 0546) Resp:  [15-18] 16  (08/31 0546) BP: (106-170)/(50-82) 106/50 mmHg (08/31 0546) SpO2:  [95 %-100 %] 100 % (08/31 0546) Weight:  [149.233 kg (329 lb)] 149.233 kg (329 lb) (08/30 1345)  Intake/Output from previous day: 08/30 0701 - 08/31 0700 In: 2900 [I.V.:2400; IV Piggyback:500] Out: 1500 [Urine:1100; Blood:400] Intake/Output this shift:     Basename 09/16/11 0454  HGB 11.6*    Basename 09/16/11 0454  WBC 9.6  RBC 3.93*  HCT 35.4*  PLT 127*    Basename 09/16/11 0454  NA 139  K 3.6  CL 106  CO2 27  BUN 20  CREATININE 1.21  GLUCOSE 108*  CALCIUM 8.3*   No results found for this basename: LABPT:2,INR:2 in the last 72 hours  Neurologically intact  Assessment/Plan: 1 Day Post-Op Procedure(s) (LRB): TOTAL HIP REVISION (Right) D/C IV fluids  SL IV    Dressing change  YATES,MARK C 09/16/2011, 7:39 AM

## 2011-09-17 LAB — TYPE AND SCREEN
DAT, IgG: POSITIVE
Unit division: 0

## 2011-09-17 LAB — CBC
Hemoglobin: 12.7 g/dL — ABNORMAL LOW (ref 13.0–17.0)
MCHC: 33.3 g/dL (ref 30.0–36.0)
RBC: 4.26 MIL/uL (ref 4.22–5.81)
WBC: 9.8 10*3/uL (ref 4.0–10.5)

## 2011-09-17 LAB — WOUND CULTURE
Culture: NO GROWTH
Gram Stain: NONE SEEN

## 2011-09-17 MED ORDER — METHOCARBAMOL 500 MG PO TABS: 500.0000 mg | ORAL_TABLET | Freq: Four times a day (QID) | ORAL | Status: AC | PRN

## 2011-09-17 MED ORDER — RIVAROXABAN 10 MG PO TABS: 10.0000 mg | ORAL_TABLET | Freq: Every day | ORAL | Status: DC

## 2011-09-17 MED ORDER — HYDROCODONE-ACETAMINOPHEN 5-325 MG PO TABS: 2.0000 | ORAL_TABLET | ORAL | Status: AC | PRN

## 2011-09-17 MED ORDER — MUPIROCIN 2 % EX OINT: TOPICAL_OINTMENT | Freq: Two times a day (BID) | CUTANEOUS | Status: AC

## 2011-09-17 NOTE — Care Management Note (Signed)
    Page 1 of 1   09/17/2011     12:38:36 PM   CARE MANAGEMENT NOTE 09/17/2011  Patient:  Keith Brown, Keith Brown   Account Number:  [id number]  Date Initiated:  09/15/2011  Documentation initiated by:  Vance Peper  Subjective/Objective Assessment:   Dr. Otelia Sergeant is s/p  right total hip revision     Action/Plan:   Patient is preopertively setup with [facility], no changes.   Anticipated DC Date:  09/17/2011   Anticipated DC Plan:  HOME W HOME HEALTH SERVICES      DC Planning Services  CM consult      [facility] Choice  HOME HEALTH   Choice offered to / List presented to:          [facility] arranged  HH-2 PT      [facility] agency  [facility]   Status of service:  Completed, signed off Medicare Important Message given?   (If response is "NO", the following Medicare IM given date fields will be blank) Date Medicare IM given:   Date Additional Medicare IM given:    Discharge Disposition:  HOME W HOME HEALTH SERVICES  Per UR Regulation:    If discussed at Long Length of Stay Meetings, dates discussed:    Comments:  09/17/2011 1130 NCM attempted several times to reach pt by via phone. Line busy. Contacted Unit RN, and states pt could not wait for RW. NCM explained he could pick up at [facility] home store located on [facility] provided contact information. Faxed order to [facility] for [facility] PT for scheduled d/c today. Info on [place] given to Unit RN to place on d/c instruction and info for [facility].  Isidoro Donning RN CCM Case Mgmt phone [phone]

## 2011-09-17 NOTE — Discharge Summary (Signed)
Physician Discharge Summary  Patient ID: Keith Brown MRN: [id number] DOB/AGE: Sep 04, 1955 56 y.o.  Admit date: 09/15/2011 Discharge date: 09/17/2011  Admission Diagnoses:painful right THA metal on metal with metalosis  Discharge Diagnoses: same Principal Problem:  *Failed total hip arthroplasty   Discharged Condition: good  Hospital Course: underwent acetabular revision to ceramic on poly   Consults: PT, OT  Significant Diagnostic Studies:  Treatments: surgery, therapy  Discharge Exam: Blood pressure 140/63, pulse 75, temperature 98.9 F (37.2 C), temperature source Oral, resp. rate 20, height 5\' 10"  (1.778 m), weight 149.233 kg (329 lb), SpO2 95.00%. Incision/Wound:incision looks good. All cultures neg  Disposition: home in good condition   Medication List  As of 09/17/2011  9:39 AM   TAKE these medications         HYDROcodone-acetaminophen 5-325 MG per tablet   Commonly known as: NORCO/VICODIN   Take 2 tablets by mouth every 4 (four) hours as needed.      methocarbamol 500 MG tablet   Commonly known as: ROBAXIN   Take 1 tablet (500 mg total) by mouth every 6 (six) hours as needed.      mupirocin ointment 2 %   Commonly known as: BACTROBAN   Apply topically 2 (two) times daily.      rivaroxaban 10 MG Tabs tablet   Commonly known as: XARELTO   Take 1 tablet (10 mg total) by mouth daily with breakfast.           Follow-up Information    Follow up with Kathryne Hitch, MD in 2 weeks.   Contact information:   [facility] [address] [place] [address] [phone]          Signed: Eldred Manges 09/17/2011, 9:39 AM

## 2011-09-17 NOTE — Progress Notes (Signed)
Orthopedic Tech Progress Note Patient Details:  Keith Brown 1955-10-16 [id number]  Patient ID: Keith Brown, male   DOB: [birth year], 56 y.o.   MRN: [id number]   Keith Brown 09/17/2011, 9:00 AM Trapeze bar

## 2011-09-17 NOTE — Progress Notes (Signed)
Subjective: 2 Days Post-Op Procedure(s) (LRB): TOTAL HIP REVISION (Right) Patient reports pain as mild.    Objective: Vital signs in last 24 hours: Temp:  [98.9 F (37.2 C)-99.6 F (37.6 C)] 98.9 F (37.2 C) (09/01 0641) Pulse Rate:  [70-84] 75  (09/01 0641) Resp:  [16-20] 20  (09/01 0800) BP: (108-150)/(58-63) 140/63 mmHg (09/01 0641) SpO2:  [95 %-100 %] 95 % (09/01 0800)  Intake/Output from previous day: 08/31 0701 - 09/01 0700 In: 1920 [P.O.:1920] Out: 1800 [Urine:1800] Intake/Output this shift: Total I/O In: 240 [P.O.:240] Out: -    Basename 09/17/11 0628 09/16/11 0454  HGB 12.7* 11.6*    Basename 09/17/11 0628 09/16/11 0454  WBC 9.8 9.6  RBC 4.26 3.93*  HCT 38.1* 35.4*  PLT 138* 127*    Basename 09/16/11 0454  NA 139  K 3.6  CL 106  CO2 27  BUN 20  CREATININE 1.21  GLUCOSE 108*  CALCIUM 8.3*   No results found for this basename: LABPT:2,INR:2 in the last 72 hours  Neurologically intact  Assessment/Plan: 2 Days Post-Op Procedure(s) (LRB): TOTAL HIP REVISION (Right) Up with therapy ready for discharge  Makana Rostad C 09/17/2011, 9:28 AM

## 2011-09-17 NOTE — Progress Notes (Signed)
Physical Therapy Treatment Patient Details Name: Keith Brown MRN: [id number] DOB: 1955/05/08 Today's Date: 09/17/2011 Time: 1308-6578 PT Time Calculation (min): 26 min  PT Assessment / Plan / Recommendation Comments on Treatment Session  Pt appropriate to d/c to home    Follow Up Recommendations  Home health PT    Barriers to Discharge        Equipment Recommendations  Rolling walker with 5" wheels    Recommendations for Other Services    Frequency 7X/week   Plan Discharge plan remains appropriate;Frequency remains appropriate    Precautions / Restrictions Precautions Precautions: Anterior Hip Precaution Booklet Issued: Yes (comment) Restrictions Weight Bearing Restrictions: Yes RLE Weight Bearing: Weight bearing as tolerated   Pertinent Vitals/Pain Pt with report of minimal pain 2/10 premedicated.     Mobility  Bed Mobility Bed Mobility: Not assessed Transfers Transfers: Sit to Stand;Stand to Sit Sit to Stand: 6: Modified independent (Device/Increase time) Stand to Sit: 6: Modified independent (Device/Increase time) Ambulation/Gait Ambulation/Gait Assistance: 6: Modified independent (Device/Increase time) Ambulation Distance (Feet): 150 Feet Assistive device: Rolling walker Ambulation/Gait Assistance Details: Initially having difficulty clearing R foot in R swing phase.  Corrected after being verbally cued.  Instructed pt to decrease dependence on UEs in Left swing phase.  Gait Pattern: Step-through pattern;Decreased stance time - right;Decreased dorsiflexion - right;Decreased weight shift to right Stairs: Yes Stairs Assistance: 6: Modified independent (Device/Increase time) Stair Management Technique: No rails;With walker;Forwards;Step to pattern Number of Stairs: 1  Wheelchair Mobility Wheelchair Mobility: No    Exercises Total Joint Exercises Hip ABduction/ADduction: 10 reps;Right;Strengthening;Other (comment);Seated (Isometric to adhere to hip  precautions) Long Arc Quad: Right;10 reps;Seated;Other (comment) (With orange theraband) Marching in Standing: 5 reps;Right;Standing   PT Diagnosis:    PT Problem List:   PT Treatment Interventions:     PT Goals Acute Rehab PT Goals PT Goal Formulation: With patient Time For Goal Achievement: 09/23/11 Potential to Achieve Goals: Good Pt will go Supine/Side to Sit: Independently PT Goal: Supine/Side to Sit - Progress: Not met Pt will go Sit to Supine/Side: Independently PT Goal: Sit to Supine/Side - Progress: Not met Pt will Transfer Bed to Chair/Chair to Bed: with modified independence PT Transfer Goal: Bed to Chair/Chair to Bed - Progress: Not met Pt will Ambulate: >150 feet;with modified independence;with rolling walker PT Goal: Ambulate - Progress: Met Pt will Go Up / Down Stairs: with modified independence;1-2 stairs;with rolling walker PT Goal: Up/Down Stairs - Progress: Met Pt will Perform Home Exercise Program: Independently PT Goal: Perform Home Exercise Program - Progress: Met  Visit Information  Last PT Received On: 09/17/11 Assistance Needed: +1    Subjective Data  Subjective: I can lift my leg better today.  Patient Stated Goal: D/C to home today    Cognition  Overall Cognitive Status: Appears within functional limits for tasks assessed/performed Arousal/Alertness: Awake/alert Orientation Level: Appears intact for tasks assessed Behavior During Session: [facility] for tasks performed    Balance     End of Session PT - End of Session Equipment Utilized During Treatment: Gait belt Activity Tolerance: Patient tolerated treatment well Patient left: in chair;with call bell/phone within reach Nurse Communication: Mobility status   GP     Keith Brown 09/17/2011, 10:20 AM Keith Brown DPT [phone]

## 2011-09-19 ENCOUNTER — Encounter (HOSPITAL_COMMUNITY): Payer: Self-pay | Admitting: Orthopaedic Surgery

## 2011-09-20 LAB — ANAEROBIC CULTURE

## 2015-03-09 ENCOUNTER — Other Ambulatory Visit: Payer: Self-pay | Admitting: Physician Assistant

## 2015-03-09 NOTE — Patient Instructions (Addendum)
Keith Brown  03/09/2015   Your procedure is scheduled on: 03-12-15  Report to [facility] Main  Entrance take [facility]  elevators to 3rd floor to  [name] at 300 PM.  Call this number if you have problems the morning of surgery [phone]   Remember: ONLY 1 PERSON MAY GO WITH YOU TO SHORT STAY TO GET  READY MORNING OF Keith Brown.  Do not eat food :After Midnight, may have clear liquids midnight until 1100 am day of sugreyr, nothing by mouth after 1100 am day of surgery.     Take these medicines the morning of surgery with A SIP OF WATER:  Loratadine (Claritin )              You may not have any metal on your body including hair pins and              piercings  Do not wear jewelry, make-up, lotions, powders or perfumes, deodorant             Do not wear nail polish.  Do not shave  48 hours prior to surgery.              Men may shave face and neck.   Do not bring valuables to the hospital. [name].  Contacts, dentures or bridgework may not be worn into surgery.  Leave suitcase in the car. After surgery it may be brought to your room.     Patients discharged the day of surgery will not be allowed to drive home.  Name and phone number of your driver: Threasa Beards wife cell [phone]  Special Instructions: N/A              Please read over the following fact sheets you were given: _____________________________________________________________________                CLEAR LIQUID DIET   Foods Allowed                                                                     Foods Excluded  Coffee and tea, regular and decaf                             liquids that you cannot  Plain Jell-O in any flavor                                             see through such as: Fruit ices (not with fruit pulp)                                     milk, soups, orange juice  Iced Popsicles  All solid food Carbonated beverages, regular and diet                                    Cranberry, grape and apple juices Sports drinks like Gatorade Lightly seasoned clear broth or consume(fat free) Sugar, honey syrup  Sample Menu Breakfast                                Lunch                                     Supper Cranberry juice                    Beef broth                            Chicken broth Jell-O                                     Grape juice                           Apple juice Coffee or tea                        Jell-O                                      Popsicle                                                Coffee or tea                        Coffee or tea  _____________________________________________________________________  Keith Brown [facility] - Preparing for Surgery Before surgery, you can play an important role.  Because skin is not sterile, your skin needs to be as free of germs as possible.  You can reduce the number of germs on your skin by washing with CHG (chlorahexidine gluconate) soap before surgery.  CHG is an antiseptic cleaner which kills germs and bonds with the skin to continue killing germs even after washing. Please DO NOT use if you have an allergy to CHG or antibacterial soaps.  If your skin becomes reddened/irritated stop using the CHG and inform your nurse when you arrive at Short Stay. Do not shave (including legs and underarms) for at least 48 hours prior to the first CHG shower.  You may shave your face/neck. Please follow these instructions carefully:  1.  Shower with CHG Soap the night before surgery and the  morning of Surgery.  2.  If you choose to wash your hair, wash your hair first as usual with your  normal  shampoo.  3.  After you shampoo, rinse your hair and body thoroughly to remove the  shampoo.  4.  Use CHG as you would any other liquid soap.  You can apply chg directly  to the skin and wash                        Gently with a scrungie or clean washcloth.  5.  Apply the CHG Soap to your body ONLY FROM THE NECK DOWN.   Do not use on face/ open                           Wound or open sores. Avoid contact with eyes, ears mouth and genitals (private parts).                       Wash face,  Genitals (private parts) with your normal soap.             6.  Wash thoroughly, paying special attention to the area where your surgery  will be performed.  7.  Thoroughly rinse your body with warm water from the neck down.  8.  DO NOT shower/wash with your normal soap after using and rinsing off  the CHG Soap.                9.  Pat yourself dry with a clean towel.            10.  Wear clean pajamas.            11.  Place clean sheets on your bed the night of your first shower and do not  sleep with pets. Day of Surgery : Do not apply any lotions/deodorants the morning of surgery.  Please wear clean clothes to the hospital/surgery center.  FAILURE TO FOLLOW THESE INSTRUCTIONS MAY RESULT IN THE CANCELLATION OF YOUR SURGERY PATIENT SIGNATURE_________________________________  NURSE SIGNATURE__________________________________  ________________________________________________________________________   Keith Brown  An incentive spirometer is a tool that can help keep your lungs clear and active. This tool measures how well you are filling your lungs with each breath. Taking long deep breaths may help reverse or decrease the chance of developing breathing (pulmonary) problems (especially infection) following:  A long period of time when you are unable to move or be active. BEFORE THE PROCEDURE   If the spirometer includes an indicator to show your best effort, your nurse or respiratory therapist will set it to a desired goal.  If possible, sit up straight or lean slightly forward. Try not to slouch.  Hold the incentive spirometer in an upright position. INSTRUCTIONS FOR USE  1. Sit on the edge of  your bed if possible, or sit up as far as you can in bed or on a chair. 2. Hold the incentive spirometer in an upright position. 3. Breathe out normally. 4. Place the mouthpiece in your mouth and seal your lips tightly around it. 5. Breathe in slowly and as deeply as possible, raising the piston or the ball toward the top of the column. 6. Hold your breath for 3-5 seconds or for as long as possible. Allow the piston or ball to fall to the bottom of the column. 7. Remove the mouthpiece from your mouth and breathe out normally. 8. Rest for a few seconds and repeat Steps 1 through 7 at least 10 times every 1-2 hours when you are awake. Take your time and take a few normal breaths between deep breaths. 9. The spirometer may include an indicator to  show your best effort. Use the indicator as a goal to work toward during each repetition. 10. After each set of 10 deep breaths, practice coughing to be sure your lungs are clear. If you have an incision (the cut made at the time of surgery), support your incision when coughing by placing a pillow or rolled up towels firmly against it. Once you are able to get out of bed, walk around indoors and cough well. You may stop using the incentive spirometer when instructed by your caregiver.  RISKS AND COMPLICATIONS  Take your time so you do not get dizzy or light-headed.  If you are in pain, you may need to take or ask for pain medication before doing incentive spirometry. It is harder to take a deep breath if you are having pain. AFTER USE  Rest and breathe slowly and easily.  It can be helpful to keep track of a log of your progress. Your caregiver can provide you with a simple table to help with this. If you are using the spirometer at home, follow these instructions: [place] IF:   You are having difficultly using the spirometer.  You have trouble using the spirometer as often as instructed.  Your pain medication is not giving enough relief  while using the spirometer.  You develop fever of 100.5 F (38.1 C) or higher. SEEK IMMEDIATE MEDICAL CARE IF:   You cough up bloody sputum that had not been present before.  You develop fever of 102 F (38.9 C) or greater.  You develop worsening pain at or near the incision site. MAKE SURE YOU:   Understand these instructions.  Will watch your condition.  Will get help right away if you are not doing well or get worse. Document Released: 05/15/2006 Document Revised: 03/27/2011 Document Reviewed: 07/16/2006 [facility] Patient Information 2014 [place], [place].   ________________________________________________________________________

## 2015-03-10 ENCOUNTER — Encounter (HOSPITAL_COMMUNITY)
Admission: RE | Admit: 2015-03-10 | Discharge: 2015-03-10 | Disposition: A | Discharge status: Home / Self Care | Payer: 59 | Source: Ambulatory Visit | Attending: Orthopaedic Surgery | Admitting: Orthopaedic Surgery

## 2015-03-10 ENCOUNTER — Encounter (HOSPITAL_COMMUNITY): Payer: Self-pay

## 2015-03-10 DIAGNOSIS — X58XXXA Exposure to other specified factors, initial encounter: Secondary | ICD-10-CM | POA: Diagnosis not present

## 2015-03-10 DIAGNOSIS — Z6841 Body Mass Index (BMI) 40.0 and over, adult: Secondary | ICD-10-CM | POA: Diagnosis not present

## 2015-03-10 DIAGNOSIS — Z96641 Presence of right artificial hip joint: Secondary | ICD-10-CM | POA: Diagnosis not present

## 2015-03-10 DIAGNOSIS — M1712 Unilateral primary osteoarthritis, left knee: Secondary | ICD-10-CM | POA: Diagnosis not present

## 2015-03-10 DIAGNOSIS — S83242A Other tear of medial meniscus, current injury, left knee, initial encounter: Secondary | ICD-10-CM | POA: Diagnosis not present

## 2015-03-10 DIAGNOSIS — E119 Type 2 diabetes mellitus without complications: Secondary | ICD-10-CM | POA: Diagnosis not present

## 2015-03-10 DIAGNOSIS — I10 Essential (primary) hypertension: Secondary | ICD-10-CM | POA: Diagnosis not present

## 2015-03-10 DIAGNOSIS — M25562 Pain in left knee: Secondary | ICD-10-CM | POA: Diagnosis present

## 2015-03-10 DIAGNOSIS — M94262 Chondromalacia, left knee: Secondary | ICD-10-CM | POA: Diagnosis not present

## 2015-03-10 DIAGNOSIS — M659 Synovitis and tenosynovitis, unspecified: Secondary | ICD-10-CM | POA: Diagnosis not present

## 2015-03-10 HISTORY — DX: Unspecified tear of unspecified meniscus, current injury, left knee, initial encounter: S83.207A

## 2015-03-10 HISTORY — DX: Essential (primary) hypertension: I10

## 2015-03-10 LAB — CBC
HCT: 52.4 % — ABNORMAL HIGH (ref 39.0–52.0)
Hemoglobin: 17 g/dL (ref 13.0–17.0)
MCH: 29.4 pg (ref 26.0–34.0)
MCHC: 32.4 g/dL (ref 30.0–36.0)
MCV: 90.5 fL (ref 78.0–100.0)
PLATELETS: 206 10*3/uL (ref 150–400)
RBC: 5.79 MIL/uL (ref 4.22–5.81)
RDW: 13.3 % (ref 11.5–15.5)
WBC: 10.6 10*3/uL — ABNORMAL HIGH (ref 4.0–10.5)

## 2015-03-10 LAB — BASIC METABOLIC PANEL
Anion gap: 10 (ref 5–15)
BUN: 21 mg/dL — ABNORMAL HIGH (ref 6–20)
CALCIUM: 9.7 mg/dL (ref 8.9–10.3)
CO2: 24 mmol/L (ref 22–32)
CREATININE: 1.07 mg/dL (ref 0.61–1.24)
Chloride: 105 mmol/L (ref 101–111)
GFR calc non Af Amer: >60 mL/min (ref >60)
GLUCOSE: 95 mg/dL (ref 65–99)
Potassium: 4.6 mmol/L (ref 3.5–5.1)
Sodium: 139 mmol/L (ref 135–145)

## 2015-03-11 MED ORDER — DEXTROSE 5 % IV SOLN
3.0000 g | INTRAVENOUS | Status: AC
  Administered 2015-03-12: 3 g via INTRAVENOUS
  Filled 2015-03-11 (×2): qty 3000

## 2015-03-12 ENCOUNTER — Encounter (HOSPITAL_COMMUNITY)
Admission: RE | Disposition: A | Discharge status: Home / Self Care | Payer: Self-pay | Source: Ambulatory Visit | Attending: Orthopaedic Surgery

## 2015-03-12 ENCOUNTER — Ambulatory Visit (HOSPITAL_COMMUNITY): Payer: 59 | Admitting: Anesthesiology

## 2015-03-12 ENCOUNTER — Ambulatory Visit (HOSPITAL_COMMUNITY)
Admission: RE | Admit: 2015-03-12 | Discharge: 2015-03-12 | Disposition: A | Discharge status: Home / Self Care | Payer: 59 | Source: Ambulatory Visit | Attending: Orthopaedic Surgery | Admitting: Orthopaedic Surgery

## 2015-03-12 ENCOUNTER — Encounter (HOSPITAL_COMMUNITY): Payer: Self-pay

## 2015-03-12 DIAGNOSIS — M94262 Chondromalacia, left knee: Secondary | ICD-10-CM | POA: Diagnosis not present

## 2015-03-12 DIAGNOSIS — Z96641 Presence of right artificial hip joint: Secondary | ICD-10-CM | POA: Diagnosis not present

## 2015-03-12 DIAGNOSIS — Z6841 Body Mass Index (BMI) 40.0 and over, adult: Secondary | ICD-10-CM | POA: Insufficient documentation

## 2015-03-12 DIAGNOSIS — I10 Essential (primary) hypertension: Secondary | ICD-10-CM | POA: Insufficient documentation

## 2015-03-12 DIAGNOSIS — E119 Type 2 diabetes mellitus without complications: Secondary | ICD-10-CM | POA: Diagnosis not present

## 2015-03-12 DIAGNOSIS — M1712 Unilateral primary osteoarthritis, left knee: Secondary | ICD-10-CM | POA: Insufficient documentation

## 2015-03-12 DIAGNOSIS — S83242A Other tear of medial meniscus, current injury, left knee, initial encounter: Secondary | ICD-10-CM | POA: Insufficient documentation

## 2015-03-12 DIAGNOSIS — M25562 Pain in left knee: Secondary | ICD-10-CM | POA: Diagnosis not present

## 2015-03-12 DIAGNOSIS — X58XXXA Exposure to other specified factors, initial encounter: Secondary | ICD-10-CM | POA: Insufficient documentation

## 2015-03-12 DIAGNOSIS — M659 Synovitis and tenosynovitis, unspecified: Secondary | ICD-10-CM | POA: Insufficient documentation

## 2015-03-12 DIAGNOSIS — S83242D Other tear of medial meniscus, current injury, left knee, subsequent encounter: Secondary | ICD-10-CM | POA: Diagnosis not present

## 2015-03-12 HISTORY — PX: KNEE ARTHROSCOPY WITH MEDIAL MENISECTOMY: SHX5651

## 2015-03-12 SURGERY — ARTHROSCOPY, KNEE, WITH MEDIAL MENISCECTOMY
Anesthesia: General | Site: Knee | Laterality: Left

## 2015-03-12 MED ORDER — ROCURONIUM BROMIDE 100 MG/10ML IV SOLN
INTRAVENOUS | Status: AC
  Filled 2015-03-12: qty 1

## 2015-03-12 MED ORDER — MIDAZOLAM HCL 2 MG/2ML IJ SOLN
INTRAMUSCULAR | Status: AC
  Filled 2015-03-12: qty 2

## 2015-03-12 MED ORDER — LIDOCAINE HCL (CARDIAC) 20 MG/ML IV SOLN
INTRAVENOUS | Status: DC | PRN
  Administered 2015-03-12: 100 mg via INTRAVENOUS

## 2015-03-12 MED ORDER — FENTANYL CITRATE (PF) 250 MCG/5ML IJ SOLN
INTRAMUSCULAR | Status: DC | PRN
  Administered 2015-03-12 (×2): 100 ug via INTRAVENOUS
  Administered 2015-03-12: 50 ug via INTRAVENOUS

## 2015-03-12 MED ORDER — PROPOFOL 10 MG/ML IV BOLUS
INTRAVENOUS | Status: DC | PRN
  Administered 2015-03-12: 200 mg via INTRAVENOUS

## 2015-03-12 MED ORDER — ONDANSETRON HCL 4 MG/2ML IJ SOLN
INTRAMUSCULAR | Status: DC | PRN
Start: 2015-03-12 — End: 2015-03-12
  Administered 2015-03-12: 4 mg via INTRAVENOUS

## 2015-03-12 MED ORDER — BUPIVACAINE HCL (PF) 0.5 % IJ SOLN
INTRAMUSCULAR | Status: AC
  Filled 2015-03-12: qty 30

## 2015-03-12 MED ORDER — FENTANYL CITRATE (PF) 250 MCG/5ML IJ SOLN
INTRAMUSCULAR | Status: AC
  Filled 2015-03-12: qty 5

## 2015-03-12 MED ORDER — MIDAZOLAM HCL 5 MG/5ML IJ SOLN
INTRAMUSCULAR | Status: DC | PRN
  Administered 2015-03-12: 2 mg via INTRAVENOUS

## 2015-03-12 MED ORDER — LIDOCAINE HCL (CARDIAC) 20 MG/ML IV SOLN
INTRAVENOUS | Status: AC
  Filled 2015-03-12: qty 5

## 2015-03-12 MED ORDER — BUPIVACAINE HCL (PF) 0.5 % IJ SOLN
INTRAMUSCULAR | Status: DC | PRN
  Administered 2015-03-12: 30 mL via INTRA_ARTICULAR

## 2015-03-12 MED ORDER — LACTATED RINGERS IV SOLN
INTRAVENOUS | Status: DC
Start: 2015-03-12 — End: 2015-03-12
  Administered 2015-03-12: 1000 mL via INTRAVENOUS
  Administered 2015-03-12: 18:00:00 via INTRAVENOUS

## 2015-03-12 MED ORDER — GLYCOPYRROLATE 0.2 MG/ML IJ SOLN
INTRAMUSCULAR | Status: AC
  Filled 2015-03-12: qty 3

## 2015-03-12 MED ORDER — OXYCODONE HCL 5 MG/5ML PO SOLN: 5.0000 mg | Freq: Once | ORAL | Status: DC | PRN

## 2015-03-12 MED ORDER — LABETALOL HCL 5 MG/ML IV SOLN
INTRAVENOUS | Status: DC | PRN
  Administered 2015-03-12: 5 mg via INTRAVENOUS

## 2015-03-12 MED ORDER — HYDROCODONE-ACETAMINOPHEN 5-325 MG PO TABS: 1.0000 | ORAL_TABLET | ORAL | Status: DC | PRN

## 2015-03-12 MED ORDER — MORPHINE SULFATE (PF) 4 MG/ML IV SOLN
INTRAVENOUS | Status: AC
  Filled 2015-03-12: qty 1

## 2015-03-12 MED ORDER — SODIUM CHLORIDE 0.9 % IR SOLN
Status: DC | PRN
  Administered 2015-03-12: 6000 mL

## 2015-03-12 MED ORDER — ONDANSETRON HCL 4 MG/2ML IJ SOLN: 4.0000 mg | Freq: Four times a day (QID) | INTRAMUSCULAR | Status: DC | PRN

## 2015-03-12 MED ORDER — PROPOFOL 10 MG/ML IV BOLUS
INTRAVENOUS | Status: AC
  Filled 2015-03-12: qty 20

## 2015-03-12 MED ORDER — MORPHINE SULFATE (PF) 4 MG/ML IV SOLN
INTRAVENOUS | Status: DC | PRN
  Administered 2015-03-12: 4 mg

## 2015-03-12 MED ORDER — OXYCODONE HCL 5 MG PO TABS: 5.0000 mg | ORAL_TABLET | Freq: Once | ORAL | Status: DC | PRN

## 2015-03-12 MED ORDER — ONDANSETRON HCL 4 MG/2ML IJ SOLN
INTRAMUSCULAR | Status: AC
  Filled 2015-03-12: qty 2

## 2015-03-12 MED ORDER — FENTANYL CITRATE (PF) 100 MCG/2ML IJ SOLN: 25.0000 ug | INTRAMUSCULAR | Status: DC | PRN

## 2015-03-12 MED ORDER — SUCCINYLCHOLINE CHLORIDE 20 MG/ML IJ SOLN
INTRAMUSCULAR | Status: DC | PRN
  Administered 2015-03-12: 100 mg via INTRAVENOUS

## 2015-03-12 SURGICAL SUPPLY — 27 items
BANDAGE ACE 6X5 VEL STRL LF (GAUZE/BANDAGES/DRESSINGS) ×2 IMPLANT
BLADE CUDA SHAVER 3.5 (BLADE) ×3 IMPLANT
DRAPE LG THREE QUARTER DISP (DRAPES) ×2 IMPLANT
DRAPE U-SHAPE 47X51 STRL (DRAPES) ×3 IMPLANT
DRSG PAD ABDOMINAL 8X10 ST (GAUZE/BANDAGES/DRESSINGS) ×3 IMPLANT
DURAPREP 26ML APPLICATOR (WOUND CARE) ×3 IMPLANT
GAUZE SPONGE 4X4 12PLY STRL (GAUZE/BANDAGES/DRESSINGS) ×2 IMPLANT
GAUZE XEROFORM 1X8 LF (GAUZE/BANDAGES/DRESSINGS) ×3 IMPLANT
GLOVE BIOGEL PI IND STRL 8 (GLOVE) ×2 IMPLANT
GLOVE BIOGEL PI INDICATOR 8 (GLOVE) ×4
GLOVE ECLIPSE 8.0 STRL XLNG CF (GLOVE) ×3 IMPLANT
GLOVE ORTHO TXT STRL SZ7.5 (GLOVE) ×3 IMPLANT
GOWN STRL REUS W/TWL XL LVL3 (GOWN DISPOSABLE) ×5 IMPLANT
KIT BASIN OR (CUSTOM PROCEDURE TRAY) ×2 IMPLANT
MANIFOLD NEPTUNE II (INSTRUMENTS) ×3 IMPLANT
PACK ARTHROSCOPY WL (CUSTOM PROCEDURE TRAY) ×3 IMPLANT
PADDING CAST COTTON 6X4 STRL (CAST SUPPLIES) ×3 IMPLANT
POSITIONER SURGICAL ARM (MISCELLANEOUS) ×4 IMPLANT
SUT ETHILON 4 0 PS 2 18 (SUTURE) ×3 IMPLANT
SYR 20CC LL (SYRINGE) ×1 IMPLANT
SYR CONTROL 10ML LL (SYRINGE) ×3 IMPLANT
TOWEL OR 17X26 10 PK STRL BLUE (TOWEL DISPOSABLE) ×4 IMPLANT
TUBING CONNECTING 10 (TUBING) ×1 IMPLANT
TUBING CONNECTING 10' (TUBING)
WAND HAND CNTRL MULTIVAC 90 (MISCELLANEOUS) IMPLANT
WATER STERILE IRR 500ML POUR (IV SOLUTION) ×2 IMPLANT
WRAP KNEE MAXI GEL POST OP (GAUZE/BANDAGES/DRESSINGS) ×3 IMPLANT

## 2015-03-12 NOTE — Brief Op Note (Signed)
03/12/2015  5:55 PM  PATIENT:  Keith Brown  60 y.o. male  PRE-OPERATIVE DIAGNOSIS:  left knee medial meniscal tear  POST-OPERATIVE DIAGNOSIS:  left knee medial meniscal tear  PROCEDURE:  Procedure(s): LEFT KNEE ARTHROSCOPY WITH PARTIAL MEDIAL MENISCECTOMY (Left)  SURGEON:  Surgeon(s) and Role:    * Mcarthur Rossetti, MD - Primary  PHYSICIAN ASSISTANT: Benita Stabile, PA-C  ANESTHESIA:   local and general  EBL:  Total I/O In: 1000 [I.V.:1000] Out: -   COUNTS:  YES  DICTATION: .Other Dictation: Dictation Number [id number]  PLAN OF CARE: Discharge to home after PACU  PATIENT DISPOSITION:  PACU - hemodynamically stable.   Delay start of Pharmacological VTE agent (>24hrs) due to surgical blood loss or risk of bleeding: not applicable

## 2015-03-12 NOTE — Discharge Instructions (Signed)
Increase activities as comfort allows. You may put full weight on your left knee. Ice and elevation as needed. Pump your feet periodically. Consider taking a 325 mg aspirin daily to twice daily for the next week. You can remove your dressings tomorrow 2/25 and get your incisions wet in the shower. Band-aids as needed daily.     General Anesthesia, Adult, Care After Refer to this sheet in the next few weeks. These instructions provide you with information on caring for yourself after your procedure. Your health care provider may also give you more specific instructions. Your treatment has been planned according to current medical practices, but problems sometimes occur. Call your health care provider if you have any problems or questions after your procedure. WHAT TO EXPECT AFTER THE PROCEDURE After the procedure, it is typical to experience:  Sleepiness.  Nausea and vomiting. HOME CARE INSTRUCTIONS  For the first 24 hours after general anesthesia:  Have a responsible person with you.  Do not drive a car. If you are alone, do not take public transportation.  Do not drink alcohol.  Do not take medicine that has not been prescribed by your health care provider.  Do not sign important papers or make important decisions.  You may resume a normal diet and activities as directed by your health care provider.  Change bandages (dressings) as directed.  If you have questions or problems that seem related to general anesthesia, call the hospital and ask for the anesthetist or anesthesiologist on call. SEEK MEDICAL CARE IF:  You have nausea and vomiting that continue the day after anesthesia.  You develop a rash. SEEK IMMEDIATE MEDICAL CARE IF:   You have difficulty breathing.  You have chest pain.  You have any allergic problems.   This information is not intended to replace advice given to you by your health care provider. Make sure you discuss any questions you have with  your health care provider.   Document Released: 04/10/2000 Document Revised: 01/23/2014 Document Reviewed: 05/03/2011 Elsevier Interactive Patient Education [employer].

## 2015-03-12 NOTE — Progress Notes (Signed)
Patient remains w good C,M,S to left foot and leg

## 2015-03-12 NOTE — Op Note (Signed)
NAMESAMARTH, Keith Brown NO.:  [id number]  MEDICAL RECORD NO.:  [id number]  LOCATION:  [facility]                         FACILITY:  [facility]  PHYSICIAN:  Lind Guest. Ninfa Linden, M.D.DATE OF BIRTH:  1955-10-02  DATE OF PROCEDURE:  03/12/2015 DATE OF DISCHARGE:  03/12/2015                              OPERATIVE REPORT   PREOPERATIVE DIAGNOSIS:  Left knee medial compartment arthritis and symptomatic medial meniscal tear.  POSTOPERATIVE DIAGNOSIS:  Left knee medial compartment arthritis and symptomatic medial meniscal tear.  PROCEDURE:  Left knee arthroscopy with debridement and partial medial meniscectomy.  FINDINGS:  Grade 3 to grade 4 chondromalacia, medial femoral condyle with loose cartilage fragments and a central medial meniscal tear.  SURGEON:  Lind Guest. Ninfa Linden, M.D.  ASSISTANT:  Erskine Emery, PA-C.  ANESTHESIA: 1. General. 2. Local with mixture of 0.25% Marcaine and morphine.  BLOOD LOSS:  Minimal.  COMPLICATIONS:  None.  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  He has been having left knee problems since about January.  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  He was getting medial joint line tenderness and pain.  He is getting the point where even extending his knee, was causing lot of clicking and he could not reach full extension of his knee.  About a week ago, drained about 35 mL of serous fluid from the knee and placed a steroid injection in the knee, it did give him some relief, but still having exquisite medial joint line tenderness. Standing x-rays of his knee shows a mild varus deformity and medial joint space narrowing, but is not bone-on-bone.  Due to his continued mechanical symptoms, he did wish for an arthroscopic intervention to see if this could calm down his symptoms.  PROCEDURE DESCRIPTION:  After informed consent was  obtained, appropriate left knee was marked.  He was brought to the operating room and placed supine on the operating table.  General anesthesia was then obtained. His left leg was prepped and draped with DuraPrep and sterile drapes including a sterile stockinette.  His left knee was then flexed and bent off the side of the table with the lateral leg post utilized with the bed raised.  Time-out was called and he was identified as correct patient and correct left knee.  I then made an anterolateral arthroscopy portal, inserted a cannula in the knee and drained a moderate effusion from the knee again.  I went to the superior portion of the knee and then swung around the medial gutter into the medial compartment and made an anteromedial incision and found significant inflamed synovium all on the medial gutter and the medial aspect of his knee.  Right away, it could see that there was a cleavage-type of radial tear with a flap component of the medial meniscus, but there was also underlying cartilage flap from the medial femoral condyle that was hanging down. Using arthroscopic shaver and up-cutting biters, I performed a partial medial meniscectomy leaving him still meniscal tissue, also had to use the shaver to perform a chondroplasty along the medial femoral condyle. The  ACL and PCL were probed and found to be intact.  The lateral side actually looked really good as well and the patellofemoral joint had minimal arthritic changes, but definitely synovitis in the suprapatellar space, which we debrided.  We then allowed the fluid to lavage through the knee and then drained all fluid from the knee.  We closed the portal sites with interrupted nylon suture and placed a mixture of morphine and Marcaine into the knee and the portal sites.  Well-padded sterile dressing was applied and he was awakened, extubated and taken to the recovery room in stable condition.  All final counts were correct. There  were no complications noted.     Lind Guest. Ninfa Linden, M.D.     CYB/MEDQ  D:  03/12/2015  T:  03/12/2015  Job:  [id number]

## 2015-03-12 NOTE — Anesthesia Preprocedure Evaluation (Signed)
Anesthesia Evaluation  Patient identified by MRN, date of birth, ID band Patient awake    Reviewed: Allergy & Precautions, H&P , NPO status , Patient's Chart, lab work & pertinent test results  History of Anesthesia Complications (+) PONV and history of anesthetic complications  Airway Mallampati: II  TM Distance: >3 FB Neck ROM: Full    Dental   Pulmonary    Pulmonary exam normal breath sounds clear to auscultation       Cardiovascular hypertension,  Rhythm:Regular Rate:Normal     Neuro/Psych  Neuromuscular disease    GI/Hepatic GERD  ,  Endo/Other  diabetes, Well Controlled, Type 2Morbid obesity  Renal/GU      Musculoskeletal  (+) Fibromyalgia -  Abdominal (+) + obese,   Peds  Hematology   Anesthesia Other Findings Medial meniscus tear  Reproductive/Obstetrics                             Anesthesia Physical  Anesthesia Plan  ASA: III  Anesthesia Plan: General   Post-op Pain Management:    Induction: Intravenous  Airway Management Planned: Oral ETT  Additional Equipment:   Intra-op Plan:   Post-operative Plan: Extubation in OR  Informed Consent: I have reviewed the patients History and Physical, chart, labs and discussed the procedure including the risks, benefits and alternatives for the proposed anesthesia with the patient or authorized representative who has indicated his/her understanding and acceptance.   Dental advisory given  Plan Discussed with: CRNA and Surgeon  Anesthesia Plan Comments:         Anesthesia Quick Evaluation

## 2015-03-12 NOTE — Anesthesia Postprocedure Evaluation (Signed)
Anesthesia Post Note  Patient: Keith Brown  Procedure(s) Performed: Procedure(s) (LRB): LEFT KNEE ARTHROSCOPY WITH PARTIAL MEDIAL MENISCECTOMY (Left)  Patient location during evaluation: PACU Anesthesia Type: General Level of consciousness: awake and alert and patient cooperative Pain management: pain level controlled Vital Signs Assessment: post-procedure vital signs reviewed and stable Respiratory status: spontaneous breathing and respiratory function stable Cardiovascular status: stable Anesthetic complications: no    Last Vitals:  Filed Vitals:   03/12/15 1837 03/12/15 1914  BP: 152/78 156/72  Pulse: 63 59  Temp: 36.8 C   Resp: 20 18    Last Pain:  Filed Vitals:   03/12/15 1915  PainSc: [place]

## 2015-03-12 NOTE — Transfer of Care (Signed)
Immediate Anesthesia Transfer of Care Note  Patient: Keith Brown  Procedure(s) Performed: Procedure(s): LEFT KNEE ARTHROSCOPY WITH PARTIAL MEDIAL MENISCECTOMY (Left)  Patient Location: PACU  Anesthesia Type:General  Level of Consciousness: awake, alert  and oriented  Airway & Oxygen Therapy: Patient Spontanous Breathing and Patient connected to face mask oxygen  Post-op Assessment: Report given to RN and Post -op Vital signs reviewed and stable  Post vital signs: Reviewed and stable  Last Vitals:  Filed Vitals:   03/12/15 1518  BP: 146/85  Pulse: 80  Temp: 36.9 C  Resp: 18    Complications: No apparent anesthesia complications

## 2015-03-12 NOTE — Anesthesia Procedure Notes (Signed)
Procedure Name: Intubation Performed by: Gean Maidens Pre-anesthesia Checklist: Patient identified, Emergency Drugs available, Suction available, Patient being monitored and Timeout performed Patient Re-evaluated:Patient Re-evaluated prior to inductionPreoxygenation: Pre-oxygenation with 100% oxygen Intubation Type: IV induction Ventilation: Two handed mask ventilation required Laryngoscope Size: Mac and 4 Grade View: Grade II Tube type: Oral Tube size: 7.5 mm Number of attempts: 1 Airway Equipment and Method: Stylet Placement Confirmation: ETT inserted through vocal cords under direct vision,  positive ETCO2,  CO2 detector and breath sounds checked- equal and bilateral Secured at: 23 cm Tube secured with: Tape Dental Injury: Teeth and Oropharynx as per pre-operative assessment

## 2015-03-12 NOTE — H&P (Signed)
Keith Brown is an 60 y.o. male.   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  Has had difficulty extending his left knee and medial joint line tenderness.  Last week we drained an effusion from his knee and placed an intra-articular steroid injection which helped only a little bit.  At this point, he wishes to proceed with a left knee arthroscopy for further evaluation and treatment of a suspected medial meniscal tear.  Past Medical History  Diagnosis Date  . Allergy     fire ants  . Stone, kidney   . Complication of anesthesia   . PONV (postoperative nausea and vomiting)   . GERD (gastroesophageal reflux disease)   . Hypertension   . Acute meniscal tear of left knee     Past Surgical History  Procedure Laterality Date  . Mr hips bilateral      2009&2010  . Back surgery      laminectomy L5-S1  . Cystoscopy kidney w/ ureteral guide wire  2010    lithotripsy  . Knee arthroscopy Right 2013  . Laminectomy  1999  . Colonscopy      polyps  . Total hip revision  09/15/2011    Procedure: TOTAL HIP REVISION;  Surgeon: Mcarthur Rossetti, MD;  Location: [place];  Service: Orthopedics;  Laterality: Right;  Revision right hip acetabular component    Family History  Problem Relation Age of Onset  . Colon cancer Father    Social History:  reports that he has never smoked. He has never used smokeless tobacco. He reports that he drinks about 4.2 oz of alcohol per week. He reports that he does not use illicit drugs.  Allergies: No Known Allergies  No prescriptions prior to admission    Results for orders placed or performed during the hospital encounter of 03/10/15 (from the past 48 hour(s))  Basic metabolic panel     Status: Abnormal   Collection Time: 03/10/15  2:30 PM  Result Value Ref Range   Sodium 139 135 - 145 mmol/L   Potassium 4.6 3.5 - 5.1 mmol/L   Chloride 105 101 - 111 mmol/L   CO2 24 22 - 32 mmol/L   Glucose, Bld 95 65 - 99 mg/dL   BUN 21 (H) 6 - 20 mg/dL   Creatinine, Ser 1.07 0.61 - 1.24 mg/dL   Calcium 9.7 8.9 - 10.3 mg/dL   GFR calc non Af Amer >60 >60 mL/min   GFR calc Af Amer >60 >60 mL/min    Comment: (NOTE) The eGFR has been calculated using the CKD EPI equation. This calculation has not been validated in all clinical situations. eGFR's persistently <60 mL/min signify possible Chronic Kidney Disease.    Anion gap 10 5 - 15  CBC     Status: Abnormal   Collection Time: 03/10/15  2:30 PM  Result Value Ref Range   WBC 10.6 (H) 4.0 - 10.5 K/uL   RBC 5.79 4.22 - 5.81 MIL/uL   Hemoglobin 17.0 13.0 - 17.0 g/dL   HCT 52.4 (H) 39.0 - 52.0 %   MCV 90.5 78.0 - 100.0 fL   MCH 29.4 26.0 - 34.0 pg   MCHC 32.4 30.0 - 36.0 g/dL   RDW 13.3 11.5 - 15.5 %   Platelets 206 150 - 400 K/uL   No results found.  Review of Systems  Musculoskeletal: Positive for joint pain.    There were no vitals taken for this visit. Physical Exam  Constitutional: He is oriented to person, place, and time. He appears well-developed and well-nourished.  HENT:  Head: Normocephalic and atraumatic.  Eyes: EOM are normal. Pupils are equal, round, and reactive to light.  Neck: Normal range of motion. Neck supple.  Cardiovascular: Normal rate and regular rhythm.   Respiratory: Effort normal and breath sounds normal.  GI: Soft. Bowel sounds are normal.  Musculoskeletal:       Left knee: He exhibits decreased range of motion, swelling and effusion. Tenderness found. Medial joint line tenderness noted.  Neurological: He is alert and oriented to person, place, and time.  Skin: Skin is warm and dry.  Psychiatric: He has a normal mood and affect.     Assessment/Plan Left knee pain, recurrent effusion, and suspected medial meniscal tear. 1)  To the OR today for a left knee arthroscopy.  Mcarthur Rossetti, MD 03/12/2015, 7:33 AM

## 2015-03-15 ENCOUNTER — Encounter (HOSPITAL_COMMUNITY): Payer: Self-pay | Admitting: Orthopaedic Surgery

## 2015-03-19 ENCOUNTER — Encounter: Payer: Self-pay | Admitting: Gastroenterology

## 2016-02-15 ENCOUNTER — Encounter: Payer: Self-pay | Admitting: Internal Medicine

## 2016-02-15 ENCOUNTER — Ambulatory Visit (INDEPENDENT_AMBULATORY_CARE_PROVIDER_SITE_OTHER): Payer: 59 | Admitting: Internal Medicine

## 2016-02-15 VITALS — BP 168/88 | HR 76 | Temp 98.7°F | Ht 70.0 in | Wt 312.4 lb

## 2016-02-15 DIAGNOSIS — Z87442 Personal history of urinary calculi: Secondary | ICD-10-CM

## 2016-02-15 DIAGNOSIS — N23 Unspecified renal colic: Secondary | ICD-10-CM | POA: Diagnosis not present

## 2016-02-15 DIAGNOSIS — R109 Unspecified abdominal pain: Secondary | ICD-10-CM | POA: Diagnosis not present

## 2016-02-15 LAB — POC URINALSYSI DIPSTICK (AUTOMATED)
Bilirubin, UA: NEGATIVE
Glucose, UA: NEGATIVE
Ketones, UA: NEGATIVE
Leukocytes, UA: NEGATIVE
Nitrite, UA: NEGATIVE
Protein, UA: NEGATIVE
UROBILINOGEN UA: 0.2
pH, UA: 6

## 2016-02-15 MED ORDER — OXYCODONE-ACETAMINOPHEN 5-325 MG PO TABS: 1.0000 | ORAL_TABLET | ORAL | 0 refills | Status: DC | PRN

## 2016-02-15 NOTE — Patient Instructions (Addendum)
Renal Colic Renal colic is pain that is caused by passing a kidney stone. The pain can be sharp and severe. It may be felt in the back, abdomen, side (flank), or groin. It can cause nausea. Renal colic can come and go. Follow these instructions at home: Watch your condition for any changes. The following actions may help to lessen any discomfort that you are feeling:  Take medicines only as directed by your health care provider.  Ask your health care provider if it is okay to take over-the-counter pain medicine.  Drink enough fluid to keep your urine clear or pale yellow. Drink 6-8 glasses of water each day.  Limit the amount of salt that you eat to less than 2 grams per day.  Reduce the amount of protein in your diet. Eat less meat, fish, nuts, and dairy.  Avoid foods such as spinach, rhubarb, nuts, or bran. These may make kidney stones more likely to form. Contact a health care provider if:  You have a fever or chills.  Your urine smells bad or looks cloudy.  You have pain or burning when you pass urine. Get help right away if:  Your flank pain or groin pain suddenly worsens.  You become confused or disoriented or you lose consciousness. This information is not intended to replace advice given to you by your health care provider. Make sure you discuss any questions you have with your health care provider. Document Released: 10/12/2004 Document Revised: 06/08/2015 Document Reviewed: 11/12/2013 Elsevier Interactive Patient Education  2017 [employer].

## 2016-02-15 NOTE — Progress Notes (Signed)
Subjective:    Patient ID: Keith Brown, male    DOB: 12-22-1955, 61 y.o.   MRN: [id number]  HPI  61 year old patient who has a prior history of nephrolithiasis and a 4 mm obstructing stone on the left requiring stent placement in 2009.  He was noted to have multiple small renal stones at that time. At approximately noon today he noticed significant right flank discomfort with radiation to the mid abdominal area pain has been quite severe and has been constant but waxes and wanes.  Pain is quite similar to his prior episode of left-sided renal colic.  No nausea or vomiting.  He states his urine has been pink in color.  Past Medical History:  Diagnosis Date  . Acute meniscal tear of left knee   . Allergy    fire ants  . Complication of anesthesia   . GERD (gastroesophageal reflux disease)   . Hypertension   . PONV (postoperative nausea and vomiting)   . Stone, kidney      Social History   Social History  . Marital status: Married    Spouse name: N/A  . Number of children: N/A  . Years of education: N/A   Occupational History  . Not on file.   Social History Main Topics  . Smoking status: Never Smoker  . Smokeless tobacco: Never Used  . Alcohol use 4.2 oz/week    7 Glasses of wine per week     Comment: glass of wine per night  . Drug use: No  . Sexual activity: Not on file   Other Topics Concern  . Not on file   Social History Narrative  . No narrative on file    Past Surgical History:  Procedure Laterality Date  . BACK SURGERY     laminectomy L5-S1  . Colonscopy     polyps  . CYSTOSCOPY KIDNEY W/ URETERAL GUIDE WIRE  2010   lithotripsy  . KNEE ARTHROSCOPY Right 2013  . KNEE ARTHROSCOPY WITH MEDIAL MENISECTOMY Left 03/12/2015   Procedure: LEFT KNEE ARTHROSCOPY WITH PARTIAL MEDIAL MENISCECTOMY;  Surgeon: Mcarthur Rossetti, MD;  Location: [facility];  Service: Orthopedics;  Laterality: Left;  . LAMINECTOMY  1999  . MR HIPS BILATERAL     2009&2010  . TOTAL  HIP REVISION  09/15/2011   Procedure: TOTAL HIP REVISION;  Surgeon: Mcarthur Rossetti, MD;  Location: [place];  Service: Orthopedics;  Laterality: Right;  Revision right hip acetabular component    Family History  Problem Relation Age of Onset  . Colon cancer Father     No Known Allergies  No current outpatient prescriptions on file prior to visit.   No current facility-administered medications on file prior to visit.     BP (!) 168/88 (BP Location: Right Arm, Patient Position: Sitting, Cuff Size: Large)   Pulse 76   Temp 98.7 F (37.1 C) (Oral)   Ht 5\' 10"  (1.778 m)   Wt (!) 312 lb 6.4 oz (141.7 kg)   SpO2 97%   BMI 44.82 kg/m     Review of Systems  Constitutional: Negative for appetite change, chills, fatigue and fever.  HENT: Negative for congestion, dental problem, ear pain, hearing loss, sore throat, tinnitus, trouble swallowing and voice change.   Eyes: Negative for pain, discharge and visual disturbance.  Respiratory: Negative for cough, chest tightness, wheezing and stridor.   Cardiovascular: Negative for chest pain, palpitations and leg swelling.  Gastrointestinal: Positive for abdominal pain. Negative for abdominal distention,  blood in stool, constipation, diarrhea, nausea and vomiting.  Genitourinary: Positive for flank pain and hematuria. Negative for difficulty urinating, discharge, genital sores and urgency.  Musculoskeletal: Negative for arthralgias, back pain, gait problem, joint swelling, myalgias and neck stiffness.  Skin: Negative for rash.  Neurological: Negative for dizziness, syncope, speech difficulty, weakness, numbness and headaches.  Hematological: Negative for adenopathy. Does not bruise/bleed easily.  Psychiatric/Behavioral: Negative for behavioral problems and dysphoric mood. The patient is not nervous/anxious.        Objective:   Physical Exam  Constitutional: He is oriented to person, place, and time. He appears well-developed.    Uncomfortable Afebrile  HENT:  Head: Normocephalic.  Right Ear: External ear normal.  Left Ear: External ear normal.  Eyes: Conjunctivae and EOM are normal.  Neck: Normal range of motion.  Cardiovascular: Normal rate and normal heart sounds.   Pulmonary/Chest: Breath sounds normal.  Abdominal: Bowel sounds are normal. He exhibits no distension. There is no tenderness. There is no rebound and no guarding.  No CVA tenderness  Musculoskeletal: Normal range of motion. He exhibits no edema or tenderness.  Neurological: He is alert and oriented to person, place, and time.  Psychiatric: He has a normal mood and affect. His behavior is normal.          Assessment & Plan:   Right-sided renal colic.  Discussed with urology, who will be available for consultation tonight. The patient's pain has resolved considerably.  We'll force fluids and treat with analgesics as necessary.  If renal colic becomes severe, he will report to [facility] ED for CT urogram and urology consultation.  Nyoka Cowden

## 2016-02-15 NOTE — Progress Notes (Signed)
Pre visit review using our clinic review tool, if applicable. No additional management support is needed unless otherwise documented below in the visit note. 

## 2016-02-16 ENCOUNTER — Ambulatory Visit: Payer: Self-pay | Admitting: Urology

## 2016-02-16 ENCOUNTER — Ambulatory Visit (HOSPITAL_COMMUNITY): Payer: 59 | Admitting: Anesthesiology

## 2016-02-16 ENCOUNTER — Ambulatory Visit (HOSPITAL_COMMUNITY)
Admission: RE | Admit: 2016-02-16 | Discharge: 2016-02-16 | Disposition: A | Discharge status: Home / Self Care | Payer: 59 | Source: Ambulatory Visit | Attending: Urology | Admitting: Urology

## 2016-02-16 ENCOUNTER — Other Ambulatory Visit: Payer: Self-pay | Admitting: Urology

## 2016-02-16 ENCOUNTER — Encounter (HOSPITAL_COMMUNITY): Payer: Self-pay | Admitting: *Deleted

## 2016-02-16 ENCOUNTER — Encounter (HOSPITAL_COMMUNITY)
Admission: RE | Disposition: A | Discharge status: Home / Self Care | Payer: Self-pay | Source: Ambulatory Visit | Attending: Urology

## 2016-02-16 DIAGNOSIS — M5136 Other intervertebral disc degeneration, lumbar region: Secondary | ICD-10-CM | POA: Diagnosis not present

## 2016-02-16 DIAGNOSIS — Z96643 Presence of artificial hip joint, bilateral: Secondary | ICD-10-CM | POA: Insufficient documentation

## 2016-02-16 DIAGNOSIS — M545 Low back pain: Secondary | ICD-10-CM | POA: Insufficient documentation

## 2016-02-16 DIAGNOSIS — I10 Essential (primary) hypertension: Secondary | ICD-10-CM | POA: Insufficient documentation

## 2016-02-16 DIAGNOSIS — R7302 Impaired glucose tolerance (oral): Secondary | ICD-10-CM | POA: Diagnosis not present

## 2016-02-16 DIAGNOSIS — K219 Gastro-esophageal reflux disease without esophagitis: Secondary | ICD-10-CM | POA: Insufficient documentation

## 2016-02-16 DIAGNOSIS — N201 Calculus of ureter: Secondary | ICD-10-CM | POA: Diagnosis not present

## 2016-02-16 DIAGNOSIS — Z6841 Body Mass Index (BMI) 40.0 and over, adult: Secondary | ICD-10-CM | POA: Diagnosis not present

## 2016-02-16 DIAGNOSIS — N202 Calculus of kidney with calculus of ureter: Secondary | ICD-10-CM | POA: Diagnosis not present

## 2016-02-16 DIAGNOSIS — N132 Hydronephrosis with renal and ureteral calculous obstruction: Secondary | ICD-10-CM | POA: Diagnosis not present

## 2016-02-16 DIAGNOSIS — M25562 Pain in left knee: Secondary | ICD-10-CM | POA: Diagnosis not present

## 2016-02-16 HISTORY — DX: Personal history of urinary calculi: Z87.442

## 2016-02-16 HISTORY — PX: HOLMIUM LASER APPLICATION: SHX5852

## 2016-02-16 HISTORY — PX: CYSTOSCOPY/RETROGRADE/URETEROSCOPY/STONE EXTRACTION WITH BASKET: SHX5317

## 2016-02-16 LAB — BASIC METABOLIC PANEL
Anion gap: 10 (ref 5–15)
BUN: 23 mg/dL — AB (ref 6–20)
CALCIUM: 9.2 mg/dL (ref 8.9–10.3)
CO2: 23 mmol/L (ref 22–32)
Chloride: 104 mmol/L (ref 101–111)
Creatinine, Ser: 1.68 mg/dL — ABNORMAL HIGH (ref 0.61–1.24)
GFR calc Af Amer: 49 mL/min — ABNORMAL LOW (ref >60)
GFR, EST NON AFRICAN AMERICAN: 43 mL/min — AB (ref >60)
GLUCOSE: 106 mg/dL — AB (ref 65–99)
Potassium: 4.2 mmol/L (ref 3.5–5.1)
Sodium: 137 mmol/L (ref 135–145)

## 2016-02-16 SURGERY — CYSTOSCOPY, WITH CALCULUS REMOVAL USING BASKET
Anesthesia: General | Laterality: Right

## 2016-02-16 MED ORDER — SUCCINYLCHOLINE CHLORIDE 200 MG/10ML IV SOSY
PREFILLED_SYRINGE | INTRAVENOUS | Status: AC
  Filled 2016-02-16: qty 10

## 2016-02-16 MED ORDER — TAMSULOSIN HCL 0.4 MG PO CAPS: 0.4000 mg | ORAL_CAPSULE | Freq: Every day | ORAL | 0 refills | Status: DC

## 2016-02-16 MED ORDER — KETOROLAC TROMETHAMINE 30 MG/ML IJ SOLN
INTRAMUSCULAR | Status: AC
  Filled 2016-02-16: qty 1

## 2016-02-16 MED ORDER — ONDANSETRON HCL 4 MG/2ML IJ SOLN
INTRAMUSCULAR | Status: AC
  Filled 2016-02-16: qty 2

## 2016-02-16 MED ORDER — HYDROMORPHONE HCL 1 MG/ML IJ SOLN: 0.2500 mg | INTRAMUSCULAR | Status: DC | PRN

## 2016-02-16 MED ORDER — PHENYLEPHRINE 40 MCG/ML (10ML) SYRINGE FOR IV PUSH (FOR BLOOD PRESSURE SUPPORT)
PREFILLED_SYRINGE | INTRAVENOUS | Status: AC
  Filled 2016-02-16: qty 10

## 2016-02-16 MED ORDER — PHENYLEPHRINE 40 MCG/ML (10ML) SYRINGE FOR IV PUSH (FOR BLOOD PRESSURE SUPPORT)
PREFILLED_SYRINGE | INTRAVENOUS | Status: DC | PRN
  Administered 2016-02-16 (×7): 80 ug via INTRAVENOUS

## 2016-02-16 MED ORDER — DEXAMETHASONE SODIUM PHOSPHATE 10 MG/ML IJ SOLN
INTRAMUSCULAR | Status: DC | PRN
  Administered 2016-02-16: 10 mg via INTRAVENOUS

## 2016-02-16 MED ORDER — PROPOFOL 10 MG/ML IV BOLUS
INTRAVENOUS | Status: DC | PRN
  Administered 2016-02-16: 200 mg via INTRAVENOUS

## 2016-02-16 MED ORDER — MIDAZOLAM HCL 2 MG/2ML IJ SOLN
INTRAMUSCULAR | Status: DC | PRN
  Administered 2016-02-16: 2 mg via INTRAVENOUS

## 2016-02-16 MED ORDER — DIPHENHYDRAMINE HCL 50 MG/ML IJ SOLN
INTRAMUSCULAR | Status: AC
  Filled 2016-02-16: qty 1

## 2016-02-16 MED ORDER — DIPHENHYDRAMINE HCL 50 MG/ML IJ SOLN
INTRAMUSCULAR | Status: DC | PRN
  Administered 2016-02-16: 12.5 mg via INTRAVENOUS

## 2016-02-16 MED ORDER — IOHEXOL 300 MG/ML  SOLN
INTRAMUSCULAR | Status: DC | PRN
  Administered 2016-02-16: 3 mL via URETHRAL

## 2016-02-16 MED ORDER — SUCCINYLCHOLINE CHLORIDE 200 MG/10ML IV SOSY
PREFILLED_SYRINGE | INTRAVENOUS | Status: DC | PRN
  Administered 2016-02-16: 140 mg via INTRAVENOUS

## 2016-02-16 MED ORDER — DEXAMETHASONE SODIUM PHOSPHATE 10 MG/ML IJ SOLN
INTRAMUSCULAR | Status: AC
  Filled 2016-02-16: qty 1

## 2016-02-16 MED ORDER — SCOPOLAMINE 1 MG/3DAYS TD PT72
1.0000 | MEDICATED_PATCH | TRANSDERMAL | Status: DC
  Administered 2016-02-16: 1 via TRANSDERMAL

## 2016-02-16 MED ORDER — KETOROLAC TROMETHAMINE 30 MG/ML IJ SOLN
INTRAMUSCULAR | Status: DC | PRN
  Administered 2016-02-16: 30 mg via INTRAVENOUS

## 2016-02-16 MED ORDER — SODIUM CHLORIDE 0.9 % IJ SOLN
INTRAMUSCULAR | Status: AC
  Filled 2016-02-16: qty 10

## 2016-02-16 MED ORDER — HYDROMORPHONE HCL 1 MG/ML IJ SOLN
1.0000 mg | INTRAMUSCULAR | Status: AC
  Administered 2016-02-16: 1 mg via INTRAVENOUS
  Filled 2016-02-16: qty 1

## 2016-02-16 MED ORDER — SODIUM CHLORIDE 0.9 % IR SOLN
Status: DC | PRN
  Administered 2016-02-16: 4000 mL via INTRAVESICAL

## 2016-02-16 MED ORDER — FENTANYL CITRATE (PF) 100 MCG/2ML IJ SOLN
INTRAMUSCULAR | Status: DC | PRN
  Administered 2016-02-16 (×2): 50 ug via INTRAVENOUS

## 2016-02-16 MED ORDER — PHENAZOPYRIDINE HCL 100 MG PO TABS: 100.0000 mg | ORAL_TABLET | Freq: Three times a day (TID) | ORAL | 0 refills | Status: DC | PRN

## 2016-02-16 MED ORDER — FENTANYL CITRATE (PF) 100 MCG/2ML IJ SOLN
INTRAMUSCULAR | Status: AC
  Filled 2016-02-16: qty 2

## 2016-02-16 MED ORDER — PROMETHAZINE HCL 25 MG/ML IJ SOLN: 6.2500 mg | INTRAMUSCULAR | Status: DC | PRN

## 2016-02-16 MED ORDER — SODIUM CHLORIDE 0.9 % IV SOLN
INTRAVENOUS | Status: AC
  Administered 2016-02-16: 20:00:00 via INTRAVENOUS

## 2016-02-16 MED ORDER — SCOPOLAMINE 1 MG/3DAYS TD PT72
MEDICATED_PATCH | TRANSDERMAL | Status: AC
  Filled 2016-02-16: qty 1

## 2016-02-16 MED ORDER — MIDAZOLAM HCL 2 MG/2ML IJ SOLN
INTRAMUSCULAR | Status: AC
  Filled 2016-02-16: qty 2

## 2016-02-16 MED ORDER — SODIUM CHLORIDE 0.9 % IV SOLN
INTRAVENOUS | Status: DC | PRN
  Administered 2016-02-16 (×2): via INTRAVENOUS

## 2016-02-16 MED ORDER — DEXTROSE 5 % IV SOLN
3.0000 g | INTRAVENOUS | Status: AC
  Administered 2016-02-16: 3 g via INTRAVENOUS
  Filled 2016-02-16: qty 3000
  Filled 2016-02-16: qty 3

## 2016-02-16 MED ORDER — ONDANSETRON HCL 4 MG/2ML IJ SOLN
INTRAMUSCULAR | Status: DC | PRN
Start: 2016-02-16 — End: 2016-02-16
  Administered 2016-02-16: 4 mg via INTRAVENOUS

## 2016-02-16 MED ORDER — PROPOFOL 10 MG/ML IV BOLUS
INTRAVENOUS | Status: AC
  Filled 2016-02-16: qty 20

## 2016-02-16 SURGICAL SUPPLY — 19 items
BAG URO CATCHER STRL LF (MISCELLANEOUS) ×4 IMPLANT
BASKET ZERO TIP NITINOL 2.4FR (BASKET) ×2 IMPLANT
BSKT STON RTRVL ZERO TP 2.4FR (BASKET) ×2
CATH INTERMIT  6FR 70CM (CATHETERS) ×2 IMPLANT
CLOTH BEACON ORANGE TIMEOUT ST (SAFETY) ×4 IMPLANT
FIBER LASER FLEXIVA 365 (UROLOGICAL SUPPLIES) IMPLANT
FIBER LASER TRAC TIP (UROLOGICAL SUPPLIES) ×2 IMPLANT
GLOVE BIOGEL M STRL SZ7.5 (GLOVE) ×8 IMPLANT
GOWN STRL REUS W/TWL LRG LVL3 (GOWN DISPOSABLE) ×8 IMPLANT
GUIDEWIRE ANG ZIPWIRE 038X150 (WIRE) IMPLANT
GUIDEWIRE STR DUAL SENSOR (WIRE) ×4 IMPLANT
IV NS 1000ML (IV SOLUTION)
IV NS 1000ML BAXH (IV SOLUTION) ×2 IMPLANT
MANIFOLD NEPTUNE II (INSTRUMENTS) ×4 IMPLANT
PACK CYSTO (CUSTOM PROCEDURE TRAY) ×4 IMPLANT
SHEATH ACCESS URETERAL 38CM (SHEATH) ×2 IMPLANT
STENT CONTOUR 6FRX26X.038 (STENTS) ×2 IMPLANT
TUBING CONNECTING 10 (TUBING) ×3 IMPLANT
TUBING CONNECTING 10' (TUBING) ×1

## 2016-02-16 NOTE — Progress Notes (Signed)
Patient is complaining of escalating right flank pain. Paged Dr Alinda Money. Verbal orders received.

## 2016-02-16 NOTE — Discharge Instructions (Signed)
1. You may see some blood in the urine and may have some burning with urination for 48-72 hours. You also may notice that you have to urinate more frequently or urgently after your procedure which is normal.  2. You should call should you develop an inability urinate, fever > 101, persistent nausea and vomiting that prevents you from eating or drinking to stay hydrated.  3. If you have a stent, you will likely urinate more frequently and urgently until the stent is removed and you may experience some discomfort/pain in the lower abdomen and flank especially when urinating. You may take pain medication prescribed to you if needed for pain. You may also intermittently have blood in the urine until the stent is removed. 4. You may remove your stent on Monday as we discussed.  Simply pull the string that is taped to your body and the stent will easily come out.  This may be best done in the shower as some urine may come out with the stent.  Usually you will feel relief once the stent is removed, but occasionally patients can develop pain due to residual swelling of the ureter that may temporarily obstruct the kidney.  This can be managed by taking pain medication and it will typically resolve with time.  Please do not hesitate to call if you have pain that is not controlled with your pain medication or does not improved within 24-48 hours.

## 2016-02-16 NOTE — Anesthesia Procedure Notes (Signed)
Procedure Name: Intubation Date/Time: 02/16/2016 8:59 PM Performed by: Cynda Familia Pre-anesthesia Checklist: Patient identified, Emergency Drugs available, Suction available and Patient being monitored Patient Re-evaluated:Patient Re-evaluated prior to inductionOxygen Delivery Method: Circle System Utilized Preoxygenation: Pre-oxygenation with 100% oxygen Intubation Type: IV induction, Rapid sequence and Cricoid Pressure applied Laryngoscope Size: Miller and 2 Grade View: Grade I Tube type: Oral Number of attempts: 1 Airway Equipment and Method: Stylet Placement Confirmation: ETT inserted through vocal cords under direct vision,  positive ETCO2 and breath sounds checked- equal and bilateral Secured at: 22 cm Tube secured with: Tape Dental Injury: Teeth and Oropharynx as per pre-operative assessment  Comments: Smooth RSI by Tobias Alexander--- intubation AM CRNA atraumatic--- teeth and mouth as preop--  Irregular surfaces front teeth prior to laryngoscopy--- bilat BS

## 2016-02-16 NOTE — Anesthesia Preprocedure Evaluation (Addendum)
Anesthesia Evaluation  Patient identified by MRN, date of birth, ID band Patient awake    Reviewed: Allergy & Precautions, NPO status , Patient's Chart, lab work & pertinent test results  History of Anesthesia Complications (+) PONV and history of anesthetic complications  Airway Mallampati: II  TM Distance: >3 FB Neck ROM: Full    Dental no notable dental hx. (+) Dental Advisory Given, Caps   Pulmonary neg pulmonary ROS,    Pulmonary exam normal        Cardiovascular hypertension, Normal cardiovascular exam     Neuro/Psych negative neurological ROS  negative psych ROS   GI/Hepatic Neg liver ROS, GERD  ,  Endo/Other  Morbid obesity  Renal/GU      Musculoskeletal   Abdominal   Peds  Hematology   Anesthesia Other Findings   Reproductive/Obstetrics                           Anesthesia Physical Anesthesia Plan  ASA: III  Anesthesia Plan: General   Post-op Pain Management:    Induction: Intravenous, Rapid sequence and Cricoid pressure planned  Airway Management Planned: Oral ETT  Additional Equipment:   Intra-op Plan:   Post-operative Plan: Extubation in OR  Informed Consent: I have reviewed the patients History and Physical, chart, labs and discussed the procedure including the risks, benefits and alternatives for the proposed anesthesia with the patient or authorized representative who has indicated his/her understanding and acceptance.   Dental advisory given  Plan Discussed with: CRNA, Anesthesiologist and Surgeon  Anesthesia Plan Comments:        Anesthesia Quick Evaluation

## 2016-02-16 NOTE — Op Note (Signed)
Preoperative diagnosis: Right ureteral and renal calculi  Postoperative diagnosis: Right ureteral and renal calculi  Procedure:  1. Cystoscopy 2. Right ureteroscopy and stone removal 3. Ureteroscopic laser lithotripsy 4. Right ureteral stent placement (6 x 26 with string) 5. Right retrograde pyelography with interpretation  Surgeon: Pryor Curia. M.D.  Anesthesia: General  Complications: None  Intraoperative findings: Right retrograde pyelography demonstrated a filling defect within the proximal right ureter consistent with the patient's known calculus without other abnormalities noted.  EBL: Minimal  Specimens: 1. Right ureteral and renal calculi  Disposition of specimens: [facility] for stone analysis  Indication: Keith Brown  is a 61 y.o. patient with urolithiasis. He had a large proximal right ureteral stone and another right lower pole renal stone. After reviewing the management options for treatment, they elected to proceed with the above surgical procedure(s). We have discussed the potential benefits and risks of the procedure, side effects of the proposed treatment, the likelihood of the patient achieving the goals of the procedure, and any potential problems that might occur during the procedure or recuperation. Informed consent has been obtained.  Description of procedure:  The patient was taken to the operating room and general anesthesia was induced.  The patient was placed in the dorsal lithotomy position, prepped and draped in the usual sterile fashion, and preoperative antibiotics were administered. A preoperative time-out was performed.   Cystourethroscopy was performed.  The patient's urethra was examined and was normal. The bladder was then systematically examined in its entirety. There was no evidence for any bladder tumors, stones, or other mucosal pathology.    Attention then turned to the right ureteral orifice and a ureteral  catheter was used to intubate the ureteral orifice.  Omnipaque contrast was injected through the ureteral catheter and a retrograde pyelogram was performed with findings as dictated above.  A 0.38 sensor guidewire was then advanced up the right ureter into the renal pelvis under fluoroscopic guidance.  A 12/14 Fr ureteral access sheath was then advanced over the guide wire. The digital flexible ureteroscope was then advanced through the access sheath into the ureter next to the guidewire and the calculus was identified and was located in the proximal right ureter.  The stone was manipulated into the renal collecting system.   The stone was then fragmented with the 200 micron holmium laser fiber on a setting of 0.5 J and frequency of 20 Hz.   The lower pole stone was then manipulated with a basket into the upper pole and fragmented similarly.  All sizable stones were then removed with a zero tip nitinol basket. The remaining stones were fragmented into small less than 2 mm pieces. Reinspection of the ureter/renal pelvis revealed no remaining visible stones or fragments of significant size.   The safety wire was then replaced and the access sheath removed.  The guidewire was backloaded through the cystoscope and a ureteral stent was advance over the wire using Seldinger technique.  The stent was positioned appropriately under fluoroscopic and cystoscopic guidance.  The wire was then removed with an adequate stent curl noted in the renal pelvis as well as in the bladder.  The bladder was then emptied and the procedure ended.  The patient appeared to tolerate the procedure well and without complications.  The patient was able to be awakened and transferred to the recovery unit in satisfactory condition.   Pryor Curia MD

## 2016-02-16 NOTE — Transfer of Care (Signed)
Immediate Anesthesia Transfer of Care Note  Patient: Keith Brown  Procedure(s) Performed: Procedure(s): CYSTOSCOPY/RETROGRADE/RIGHT FLEXIBLE URETEROSCOPY/STONE EXTRACTION WITH BASKET (Right) HOLMIUM LASER APPLICATION (Left)  Patient Location: PACU  Anesthesia Type:General  Level of Consciousness: sedated  Airway & Oxygen Therapy: Patient Spontanous Breathing and Patient connected to nasal cannula oxygen  Post-op Assessment: Report given to RN and Post -op Vital signs reviewed and stable  Post vital signs: Reviewed and stable  Last Vitals:  Vitals:   02/16/16 1830  BP: (!) 168/91  Pulse: 64  Resp: 18  Temp: 37.3 C    Last Pain:  Vitals:   02/16/16 1950  TempSrc:   PainSc: Asleep      Patients Stated Pain Goal: 4 ([id number])  Complications: No apparent anesthesia complications

## 2016-02-16 NOTE — H&P (Signed)
Office Visit Report     02/16/2016   --------------------------------------------------------------------------------   Keith Brown  MRN: [id number]  PRIMARY CARE:  Bluford Kaufmann, MD  DOB: [birth year], 61 year old Male  REFERRING:  Bluford Kaufmann, MD  SSN:-[id number]  PROVIDER:  Raynelle Bring, M.D.    LOCATION:  [facility] 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 Jeffie Pollock in 2009. He has developed some intermittent right-sided flank pain over the past 1-2 weeks although it was not severe until yesterday when it began to feel more like his prior kidney stone pain. He has not had any fevers. He has had some mild nausea but no vomiting. He has had some pinkish tinged urine. Currently, his pain has been controlled with oxycodone. He denies any other associated symptoms or modifying factors.     ALLERGIES: No Allergies    MEDICATIONS: Oxycodone-Acetaminophen     GU PSH: Cystoscopy Insert Stent - 2009 Renal ESWL - 2009      PSH Notes: Lithotripsy, Hip Surgery, Dental Surgery, Back Surgery, Cystoscopy With Insertion Of Ureteral Stent Left   NON-GU PSH: Hip Replacement, Bilateral Knee Arthroscopy/surgery, Bilateral Lumbar Laminectomy    GU PMH: Kidney Stone, Kidney stone on left side - 2014      PMH Notes:  1898-01-16 00:00:00 - Note: Normal Routine History And Physical Adult  2007-04-19 08:45:38 - Note: Arthritis   NON-GU PMH: Arthritis    FAMILY HISTORY: 1 son - Other Arthritis - Mother, Father Colon Cancer - Father Congestive Heart Failure - Mother Death of family member - Mother Diabetes - Father Hypertension - Mother, Father nephrolithiasis - Father, Brother renal failure - Mother stroke - Mother   SOCIAL HISTORY: Marital Status:  Married Current Smoking Status: Patient has never smoked.   Tobacco Use Assessment Completed: Used Tobacco in last 30 days? Drinks 1 drink per day.  Drinks 2 caffeinated drinks per day. Patient's occupation Animal nutritionist.     Notes: Tobacco Use, Caffeine Use, Marital History - Currently Married, Alcohol Use, Occupation:   REVIEW OF SYSTEMS:    GU Review Male:   Patient reports get up at night to urinate and trouble starting your streams. Patient denies frequent urination, hard to postpone urination, burning/ pain with urination, leakage of urine, stream starts and stops, and have to strain to urinate .  Gastrointestinal (Lower):   Patient reports constipation. Patient denies diarrhea.  Gastrointestinal (Upper):   Patient reports nausea. Patient denies vomiting.  Constitutional:   Patient denies fever, night sweats, weight loss, and fatigue.  Skin:   Patient denies skin rash/ lesion and itching.  Eyes:   Patient denies blurred vision and double vision.  Ears/ Nose/ Throat:   Patient denies sore throat and sinus problems.  Hematologic/Lymphatic:   Patient denies swollen glands and easy bruising.  Cardiovascular:   Patient denies leg swelling and chest pains.  Respiratory:   Patient denies cough and shortness of breath.  Endocrine:   Patient denies excessive thirst.  Musculoskeletal:   Patient reports back pain and joint pain.   Neurological:   Patient denies headaches and dizziness.  Psychologic:   Patient denies depression and anxiety.   VITAL SIGNS:      02/16/2016 04:17 PM  Weight 310 lb / 140.61 kg  Height 70 in / 177.8 cm  BP 175/88 mmHg  Pulse 56 /min  BMI 44.5 kg/m   MULTI-SYSTEM PHYSICAL EXAMINATION:    Constitutional: Well-nourished. No physical deformities. Normally developed. Good grooming.  Neck: Neck symmetrical, not swollen. Normal tracheal position.  Respiratory: No labored breathing, no use of accessory muscles. Clear bilaterally  Cardiovascular:  Normal temperature, normal extremity pulses, no swelling, no varicosities. Regular rate and rhythm.  Skin: No paleness, no jaundice, no cyanosis. No lesion, no ulcer, no rash.  Neurologic / Psychiatric: Oriented to time, oriented to place, oriented to person. No depression, no anxiety, no agitation.  Gastrointestinal: Mild right CVA tenderness with mild right upper quadrant tenderness.  Eyes: Normal conjunctivae. Normal eyelids.  Ears, Nose, Mouth, and Throat: Left ear no scars, no lesions, no masses. Right ear no scars, no lesions, no masses. Nose no scars, no lesions, no masses. Normal hearing. Normal lips.  Musculoskeletal: Normal gait and station of head and neck.     PAST DATA REVIEWED:  Source Of History:  Patient  X-Ray Review: KUB: Reviewed Films. I have thoroughly reviewed his KUB x-ray. The right renal and proximal ureteral calculi are not easily visualized on exam. C.T. Stone Protocol: Reviewed Films. I independently reviewed his CT scan today. This demonstrates an 8-9 mm right UPJ calculus with associated hydronephrosis. He also has nonobstructing right renal calculi.    PROCEDURES:         C.T. Urogram - [id number]               KUB - [id number]  A single view of the abdomen is obtained.               Urinalysis w/Scope - 81001 Dipstick Dipstick Cont'd Micro  Color: Yellow Bilirubin: Neg WBC/hpf: NS (Not Seen)  Appearance: Clear Ketones: Neg RBC/hpf: 0 - 2/hpf  Specific Gravity: 1.010 Blood: Trace Bacteria: NS (Not Seen)  pH: 6.0 Protein: Neg Cystals: NS (Not Seen)  Glucose: Neg Urobilinogen: 0.2 Casts: NS (Not Seen)    Nitrites: Neg Trichomonas: Not Present    Leukocyte Esterase: Neg Mucous: Not Present      Epithelial Cells: 0 - 5/hpf      Yeast: NS (Not Seen)      Sperm: Not Present         Urinalysis w/Scope - 81001 Dipstick Dipstick Cont'd Micro  Specimen: Voided Bilirubin: Neg WBC/hpf: NS (Not Seen)  Color: Yellow Ketones: Neg RBC/hpf: NS (Not Seen)   Appearance: Clear Blood: Trace Lysed Bacteria: NS (Not Seen)  Specific Gravity: 1.010 Protein: Neg Cystals: NS (Not Seen)  pH: 6.0 Urobilinogen: 0.2 Casts: NS (Not Seen)  Glucose: Neg Nitrites: Neg Trichomonas: Not Present    Leukocyte Esterase: Neg      ASSESSMENT:      ICD-10 Details  1 GU:   Low back pain - M54.5   2   Kidney Stone - N20.0   3   Calculus Ureter - N20.1    PLAN:           Orders X-Rays: C.T. Stone Protocol Without Contrast    KUB  X-Ray Notes: History:  Hematuria: Yes/No  Patient to see MD after exam: Yes/No  Previous exam: CT / IVP/ US/ KUB/ None  When:  Where:  Diabetic: Yes/ No  BUN/ Creatinine:  Date of last BUN Creatinine:  Weight in pounds:  Allergy- IV Contrast: Yes/ No  Conflicting diabetic meds: Yes/ No  Diabetic Meds:  Prior Authorization #: [id number]           Schedule  Document Letter(s):  Created for Patient: Clinical Summary         Notes:   1. Right proximal UPJ calculus: Unfortunately, his stone is not radiopaque and therefore he is not an appropriate candidate for shockwave lithotripsy. His stone is too large to likely pass spontaneously. After reviewing options, he did express interest in proceeding with ureteroscopic laser lithotripsy. Based on his need and desire to resume work as soon as possible, he did wish to proceed with treatment expediently. I therefore did talk with the operating room and they are able to add him on as a procedure this evening. He will undergo cystoscopy, right retrograde pyelography, right ureteroscopic laser lithotripsy, and right ureteral stent placement. He understands that we simply will place a right ureteral stent if we cannot access the proximal ureter safely. He understands that this would then require a second procedure. He understands the need for a postoperative stent temporarily. We have reviewed all of the potential risks, complications, and expected recovery process associated with  this procedure. He gives informed consent.   CC: Dr. Bluford Kaufmann

## 2016-02-17 ENCOUNTER — Encounter (HOSPITAL_COMMUNITY): Payer: Self-pay | Admitting: Urology

## 2016-02-17 DIAGNOSIS — N201 Calculus of ureter: Secondary | ICD-10-CM | POA: Diagnosis not present

## 2016-02-17 NOTE — Anesthesia Postprocedure Evaluation (Addendum)
Anesthesia Post Note  Patient: Keith Brown  Procedure(s) Performed: Procedure(s) (LRB): CYSTOSCOPY/RETROGRADE/RIGHT FLEXIBLE URETEROSCOPY/STONE EXTRACTION WITH BASKET (Right) HOLMIUM LASER APPLICATION (Left)  Patient location during evaluation: PACU Anesthesia Type: General Level of consciousness: sedated Pain management: pain level controlled Vital Signs Assessment: post-procedure vital signs reviewed and stable Respiratory status: spontaneous breathing and respiratory function stable Cardiovascular status: stable Anesthetic complications: no        Last Vitals:  Vitals:   02/16/16 2314 02/16/16 2328  BP: (!) 166/83   Pulse: (!) 50 60  Resp: 20 11  Temp: 36.8 C     Last Pain:  Vitals:   02/16/16 2314  TempSrc:   PainSc: Asleep   Pain Goal: Patients Stated Pain Goal: 4 (02/16/16 1930)               [place]

## 2016-03-16 DIAGNOSIS — N202 Calculus of kidney with calculus of ureter: Secondary | ICD-10-CM | POA: Diagnosis not present

## 2016-04-12 ENCOUNTER — Other Ambulatory Visit: Payer: 59

## 2016-04-18 ENCOUNTER — Encounter: Payer: 59 | Admitting: Internal Medicine

## 2016-04-18 ENCOUNTER — Ambulatory Visit: Payer: 59 | Admitting: Internal Medicine

## 2016-05-09 ENCOUNTER — Ambulatory Visit (INDEPENDENT_AMBULATORY_CARE_PROVIDER_SITE_OTHER): Payer: 59 | Admitting: Internal Medicine

## 2016-05-09 ENCOUNTER — Encounter: Payer: Self-pay | Admitting: Internal Medicine

## 2016-05-09 VITALS — BP 148/82 | HR 89 | Temp 99.3°F | Ht 69.0 in | Wt 306.8 lb

## 2016-05-09 DIAGNOSIS — Z800 Family history of malignant neoplasm of digestive organs: Secondary | ICD-10-CM | POA: Insufficient documentation

## 2016-05-09 DIAGNOSIS — E790 Hyperuricemia without signs of inflammatory arthritis and tophaceous disease: Secondary | ICD-10-CM | POA: Insufficient documentation

## 2016-05-09 DIAGNOSIS — G5602 Carpal tunnel syndrome, left upper limb: Secondary | ICD-10-CM

## 2016-05-09 DIAGNOSIS — Z0000 Encounter for general adult medical examination without abnormal findings: Secondary | ICD-10-CM | POA: Diagnosis not present

## 2016-05-09 DIAGNOSIS — Z86010 Personal history of colonic polyps: Secondary | ICD-10-CM | POA: Diagnosis not present

## 2016-05-09 MED ORDER — HYDROCHLOROTHIAZIDE 12.5 MG PO TABS: 12.5000 mg | ORAL_TABLET | Freq: Every day | ORAL | 3 refills | Status: DC

## 2016-05-09 MED ORDER — ALLOPURINOL 100 MG PO TABS: 100.0000 mg | ORAL_TABLET | Freq: Every day | ORAL | 6 refills | Status: DC

## 2016-05-09 NOTE — Patient Instructions (Addendum)
WE NOW OFFER   [facility] FAST TRACK!!!  SAME DAY Appointments for ACUTE CARE  Such as: Sprains, Injuries, cuts, abrasions, rashes, muscle pain, joint pain, back pain Colds, flu, sore throats, headache, allergies, cough, fever  Ear pain, sinus and eye infections Abdominal pain, nausea, vomiting, diarrhea, upset stomach Animal/insect bites  3 Easy Ways to Schedule: Walk-In Scheduling Call in scheduling Mychart Sign-up: [email or web address]   Limit your sodium (Salt) intake  Schedule your colonoscopy to help detect colon cancer.   Please check your blood pressure on a regular basis.  If it is consistently greater than 150/90, please make an office appointment.  DASH Eating Plan DASH stands for "Dietary Approaches to Stop Hypertension." The DASH eating plan is a healthy eating plan that has been shown to reduce high blood pressure (hypertension). It may also reduce your risk for type 2 diabetes, heart disease, and stroke. The DASH eating plan may also help with weight loss. What are tips for following this plan? General guidelines   Avoid eating more than 2,300 mg (milligrams) of salt (sodium) a day. If you have hypertension, you may need to reduce your sodium intake to 1,500 mg a day.  Limit alcohol intake to no more than 1 drink a day for nonpregnant women and 2 drinks a day for men. One drink equals 12 oz of beer, 5 oz of wine, or 1 oz of hard liquor.  Work with your health care provider to maintain a healthy body weight or to lose weight. Ask what an ideal weight is for you.  Get at least 30 minutes of exercise that causes your heart to beat faster (aerobic exercise) most days of the week. Activities may include walking, swimming, or biking.  Work with your health care provider or diet and nutrition specialist (dietitian) to adjust your eating plan to your individual calorie needs. Reading food labels   Check food labels for the amount of sodium per  serving. Choose foods with less than 5 percent of the Daily Value of sodium. Generally, foods with less than 300 mg of sodium per serving fit into this eating plan.  To find whole grains, look for the word "whole" as the first word in the ingredient list. Shopping   Buy products labeled as "low-sodium" or "no salt added."  Buy fresh foods. Avoid canned foods and premade or frozen meals. Cooking   Avoid adding salt when cooking. Use salt-free seasonings or herbs instead of table salt or sea salt. Check with your health care provider or pharmacist before using salt substitutes.  Do not fry foods. Cook foods using healthy methods such as baking, boiling, grilling, and broiling instead.  Cook with heart-healthy oils, such as olive, canola, soybean, or sunflower oil. Meal planning    Eat a balanced diet that includes:  5 or more servings of fruits and vegetables each day. At each meal, try to fill half of your plate with fruits and vegetables.  Up to 6-8 servings of whole grains each day.  Less than 6 oz of lean meat, poultry, or fish each day. A 3-oz serving of meat is about the same size as a deck of cards. One egg equals 1 oz.  2 servings of low-fat dairy each day.  A serving of nuts, seeds, or beans 5 times each week.  Heart-healthy fats. Healthy fats called Omega-3 fatty acids are found in foods such as flaxseeds and coldwater fish, like sardines, salmon, and mackerel.  Limit how much you eat of the  following:  Canned or prepackaged foods.  Food that is high in trans fat, such as fried foods.  Food that is high in saturated fat, such as fatty meat.  Sweets, desserts, sugary drinks, and other foods with added sugar.  Full-fat dairy products.  Do not salt foods before eating.  Try to eat at least 2 vegetarian meals each week.  Eat more home-cooked food and less restaurant, buffet, and fast food.  When eating at a restaurant, ask that your food be prepared with less  salt or no salt, if possible. What foods are recommended? The items listed may not be a complete list. Talk with your dietitian about what dietary choices are best for you. Grains  Whole-grain or whole-wheat bread. Whole-grain or whole-wheat pasta. Brown rice. [place] Morrow. Bulgur. Whole-grain and low-sodium cereals. Pita bread. Low-fat, low-sodium crackers. Whole-wheat flour tortillas. Vegetables  Fresh or frozen vegetables (raw, steamed, roasted, or grilled). Low-sodium or reduced-sodium tomato and vegetable juice. Low-sodium or reduced-sodium tomato sauce and tomato paste. Low-sodium or reduced-sodium canned vegetables. Fruits  All fresh, dried, or frozen fruit. Canned fruit in natural juice (without added sugar). Meat and other protein foods  Skinless chicken or [place]. Ground chicken or [place]. Pork with fat trimmed off. Fish and seafood. Egg whites. Dried beans, peas, or lentils. Unsalted nuts, nut butters, and seeds. Unsalted canned beans. Lean cuts of beef with fat trimmed off. Low-sodium, lean deli meat. Dairy  Low-fat (1%) or fat-free (skim) milk. Fat-free, low-fat, or reduced-fat cheeses. Nonfat, low-sodium ricotta or cottage cheese. Low-fat or nonfat yogurt. Low-fat, low-sodium cheese. Fats and oils  Soft margarine without trans fats. Vegetable oil. Low-fat, reduced-fat, or light mayonnaise and salad dressings (reduced-sodium). Canola, safflower, olive, soybean, and sunflower oils. Avocado. Seasoning and other foods  Herbs. Spices. Seasoning mixes without salt. Unsalted popcorn and pretzels. Fat-free sweets. What foods are not recommended? The items listed may not be a complete list. Talk with your dietitian about what dietary choices are best for you. Grains  Baked goods made with fat, such as croissants, muffins, or some breads. Dry pasta or rice meal packs. Vegetables  Creamed or fried vegetables. Vegetables in a cheese sauce. Regular canned vegetables (not low-sodium or  reduced-sodium). Regular canned tomato sauce and paste (not low-sodium or reduced-sodium). Regular tomato and vegetable juice (not low-sodium or reduced-sodium). Angie Fava. Olives. Fruits  Canned fruit in a light or heavy syrup. Fried fruit. Fruit in cream or butter sauce. Meat and other protein foods  Fatty cuts of meat. Ribs. Fried meat. Berniece Salines. Sausage. Bologna and other processed lunch meats. Salami. Fatback. Hotdogs. Bratwurst. Salted nuts and seeds. Canned beans with added salt. Canned or smoked fish. Whole eggs or egg yolks. Chicken or [place] with skin. Dairy  Whole or 2% milk, cream, and half-and-half. Whole or full-fat cream cheese. Whole-fat or sweetened yogurt. Full-fat cheese. Nondairy creamers. Whipped toppings. Processed cheese and cheese spreads. Fats and oils  Butter. Stick margarine. Lard. Shortening. Ghee. Bacon fat. Tropical oils, such as coconut, palm kernel, or palm oil. Seasoning and other foods  Salted popcorn and pretzels. Onion salt, garlic salt, seasoned salt, table salt, and sea salt. Worcestershire sauce. Tartar sauce. Barbecue sauce. Teriyaki sauce. Soy sauce, including reduced-sodium. Steak sauce. Canned and packaged gravies. Fish sauce. Oyster sauce. Cocktail sauce. Horseradish that you find on the shelf. Ketchup. Mustard. Meat flavorings and tenderizers. Bouillon cubes. Hot sauce and Tabasco sauce. Premade or packaged marinades. Premade or packaged taco seasonings. Relishes. Regular salad dressings. Where to find more  information:  National Heart, Lung, and Blood Institute: https://wilson-eaton.com/  American Heart Association: www.heart.org Summary  The DASH eating plan is a healthy eating plan that has been shown to reduce high blood pressure (hypertension). It may also reduce your risk for type 2 diabetes, heart disease, and stroke.  With the DASH eating plan, you should limit salt (sodium) intake to 2,300 mg a day. If you have hypertension, you may need to reduce your  sodium intake to 1,500 mg a day.  When on the DASH eating plan, aim to eat more fresh fruits and vegetables, whole grains, lean proteins, low-fat dairy, and heart-healthy fats.  Work with your health care provider or diet and nutrition specialist (dietitian) to adjust your eating plan to your individual calorie needs. This information is not intended to replace advice given to you by your health care provider. Make sure you discuss any questions you have with your health care provider. Document Released: 12/22/2010 Document Revised: 12/27/2015 Document Reviewed: 12/27/2015 Elsevier Interactive Patient Education  2017 [employer].   Please obtain 24-hour urine specimen for calcium, uric acid and creatinine clearance prior to initiating drug therapy  Consider home sleep study to exclude OSA

## 2016-05-09 NOTE — Progress Notes (Signed)
Pre visit review using our clinic review tool, if applicable. No additional management support is needed unless otherwise documented below in the visit note. 

## 2016-05-09 NOTE — Progress Notes (Signed)
Subjective:    Patient ID: Keith Brown, male    DOB: [birth year], 61 y.o.   MRN: [id number]  HPI  61 year old patient who is seen today for a preventive health examination He was last seen in January with renal colic.  He has been seen by urology and required stenting.  Stone analysis revealed a mixed stone of calcium oxalate as well as uric acid.  The patient has a history of known hyperuricemia with uric acid level greater than 9. He has a personal history of polyps and a family history of colon cancer ( father age 4 with colon cancer).  Last colonoscopy approximately 6 years ago He also describes some paresthesias of his nondominant left hand in the distribution of the median nerve. He has a long history of impaired glucose tolerance Recent lab reviewed and revealed a normal fasting blood sugar  He has an extensive orthopedic history  Family history  Father age 30 with metastatic colon cancer.  Also a history of renal stone disease, although onset diabetes, hypertension and BPH Mother died age 56.  History of cerebrovascular disease, renal failure One sister with a history of bipolar disease, obesity, died at 22 One brother with apparent OHS.  Died at 2 4 other brothers One sister also died an accidental death  Past Medical History:  Diagnosis Date  . Acute meniscal tear of left knee   . Allergy    fire ants  . Complication of anesthesia   . GERD (gastroesophageal reflux disease)   . History of kidney stones   . Hypertension   . PONV (postoperative nausea and vomiting)      Social History   Social History  . Marital status: Married    Spouse name: N/A  . Number of children: N/A  . Years of education: N/A   Occupational History  . Not on file.   Social History Main Topics  . Smoking status: Never Smoker  . Smokeless tobacco: Never Used  . Alcohol use 4.2 oz/week    7 Glasses of wine per week     Comment: glass of wine per night  . Drug use: No  . Sexual  activity: Not on file   Other Topics Concern  . Not on file   Social History Narrative  . No narrative on file    Past Surgical History:  Procedure Laterality Date  . BACK SURGERY     laminectomy L5-S1  . Colonscopy     polyps  . CYSTOSCOPY KIDNEY W/ URETERAL GUIDE WIRE  2010   lithotripsy  . CYSTOSCOPY/RETROGRADE/URETEROSCOPY/STONE EXTRACTION WITH BASKET Right 02/16/2016   Procedure: CYSTOSCOPY/RETROGRADE/RIGHT FLEXIBLE URETEROSCOPY/STONE EXTRACTION WITH BASKET;  Surgeon: Raynelle Bring, MD;  Location: [facility];  Service: Urology;  Laterality: Right;  . HOLMIUM LASER APPLICATION Left 9/56/3875   Procedure: HOLMIUM LASER APPLICATION;  Surgeon: Raynelle Bring, MD;  Location: [facility];  Service: Urology;  Laterality: Left;  . KNEE ARTHROSCOPY Right 2013  . KNEE ARTHROSCOPY WITH MEDIAL MENISECTOMY Left 03/12/2015   Procedure: LEFT KNEE ARTHROSCOPY WITH PARTIAL MEDIAL MENISCECTOMY;  Surgeon: Mcarthur Rossetti, MD;  Location: [facility];  Service: Orthopedics;  Laterality: Left;  . LAMINECTOMY  1999  . MR HIPS BILATERAL     2009&2010  . TOTAL HIP REVISION  09/15/2011   Procedure: TOTAL HIP REVISION;  Surgeon: Mcarthur Rossetti, MD;  Location: [facility];  Service: Orthopedics;  Laterality: Right;  Revision right hip acetabular component    Family History  Problem Relation Age  of Onset  . Colon cancer Father     No Known Allergies  No current outpatient prescriptions on file prior to visit.   No current facility-administered medications on file prior to visit.     BP (!) 148/82 (BP Location: Left Arm, Patient Position: Sitting, Cuff Size: Normal)   Pulse 89   Temp 99.3 F (37.4 C) (Oral)   Ht 5\' 9"  (1.753 m)   Wt (!) 306 lb 12.8 oz (139.2 kg)   SpO2 97%   BMI 45.31 kg/m     Review of Systems  Constitutional: Negative for appetite change, chills, fatigue and fever.  HENT: Negative for congestion, dental problem, ear pain, hearing loss, sore throat, tinnitus, trouble  swallowing and voice change.   Eyes: Negative for pain, discharge and visual disturbance.  Respiratory: Negative for cough, chest tightness, wheezing and stridor.   Cardiovascular: Negative for chest pain, palpitations and leg swelling.  Gastrointestinal: Negative for abdominal distention, abdominal pain, blood in stool, constipation, diarrhea, nausea and vomiting.  Genitourinary: Negative for difficulty urinating, discharge, flank pain, genital sores, hematuria and urgency.  Musculoskeletal: Positive for arthralgias, back pain and gait problem. Negative for joint swelling, myalgias and neck stiffness.  Skin: Negative for rash.  Neurological: Negative for dizziness, syncope, speech difficulty, weakness, numbness and headaches.  Hematological: Negative for adenopathy. Does not bruise/bleed easily.  Psychiatric/Behavioral: Negative for behavioral problems and dysphoric mood. The patient is not nervous/anxious.        Objective:   Physical Exam  Constitutional: He is oriented to person, place, and time. He appears well-developed.  Weight 306 Blood pressure 140/76  HENT:  Head: Normocephalic.  Right Ear: External ear normal.  Left Ear: External ear normal.  Small 3-4 mm smooth papule on the left pharyngeal area  Eyes: Conjunctivae and EOM are normal.  Neck: Normal range of motion. Neck supple. No thyromegaly present.  Cardiovascular: Normal rate, regular rhythm, normal heart sounds and intact distal pulses.   Pedal pulses full  Pulmonary/Chest: Effort normal and breath sounds normal. No respiratory distress. He has no wheezes. He has no rales.  Abdominal: Soft. Bowel sounds are normal.  Musculoskeletal: Normal range of motion. He exhibits no edema or tenderness.  Neurological: He is alert and oriented to person, place, and time.  Psychiatric: He has a normal mood and affect. His behavior is normal.          Assessment & Plan:   Preventive health examination Renal stone disease.   These are apparently mixed calcium oxalate/uric acid stones.  Urology has requested a 24-hour urine for calcium and uric acid excretion.  Hyperuricemia- consider low-dose allopurinol for hyperuricemia and renal stone prevention  Hypertensive suspect.  DASH diet and nonpharmacologic measures discussed.  Will consider low-dose hydrochlorothiazide for blood pressure control and renal stone prevention Personal history of colonic polyps/family history of colon cancer.  Will schedule follow-up colonoscopy at patient's earliest convenience Probable left-sided carpal tunnel syndrome.  Will schedule nerve conduction studies Impaired glucose tolerance.  Hemoglobin A1c 5.1 today Exogenous obesity.  Weight loss encouraged  Follow-up 6 months Follow-up urology Follow-up colonoscopy  Nyoka Cowden

## 2016-05-15 ENCOUNTER — Ambulatory Visit (INDEPENDENT_AMBULATORY_CARE_PROVIDER_SITE_OTHER): Payer: 59 | Admitting: Physical Medicine and Rehabilitation

## 2016-05-15 ENCOUNTER — Encounter (INDEPENDENT_AMBULATORY_CARE_PROVIDER_SITE_OTHER): Payer: Self-pay | Admitting: Physical Medicine and Rehabilitation

## 2016-05-15 DIAGNOSIS — R202 Paresthesia of skin: Secondary | ICD-10-CM | POA: Diagnosis not present

## 2016-05-15 NOTE — Progress Notes (Signed)
Keith Brown - 61 y.o. male MRN [id number]  Date of birth: [birth year]  Office Visit Note: Visit Date: 05/15/2016 PCP: Nyoka Cowden, MD Referred by: Marletta Lor, MD  Subjective: Chief Complaint  Patient presents with  . Left Arm - Pain  . Left Shoulder - Numbness, Pain  . Left Hand - Numbness   HPI: Keith Brown is a 61 year old right-hand dominant partner of mine here at [employer] complaining of several months of mostly anterior and dorsal tingling numbness type pain in the left shoulder with referral down the arm into the forearm and into the radial 3 digits. He reports occasional symptoms into the fourth digit. He denies any symptoms of the digit. He reports some worsening at night and with certain positions. 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 He has not noted any focal weakness. He has not had prior electrodiagnostic studies. He has not had any prior cervical MRI. He has had no prior cervical surgery. He says only on rare occasion would he ever get numbness or tingling in the right hand. He is not diabetic and does not have any thyroid disease.    ROS Otherwise per HPI.  Assessment & Plan: Visit Diagnoses:  1. Paresthesia of skin     Plan: No additional findings.  Impression: The above electrodiagnostic study is ABNORMAL and reveals evidence of essentially a mild "double crush" phenomena with 1.) a mild left median nerve entrapment at the wrist (carpal tunnel syndrome) affecting sensory components and 2.) a mild chronic C6 radiculopathy on the left.  No significant nerve damage noted in either case.   There is no significant electrodiagnostic evidence of any other focal nerve entrapment, brachial plexopathy or generalized peripheral neuropathy.   Recommendations: 1.  Follow-up with referring physician. 2.  Continue current management of symptoms, suggest wearing splint on the wrist at night and if the radicular pain declares  itself and worsens then cervical imaging with x-ray and possible MRI. 3. May wish to repeat studies if symptoms are persistent or worsening.  Meds & Orders: No orders of the defined types were placed in this encounter.   Orders Placed This Encounter  Procedures  . NCV with EMG (electromyography)    Follow-up: Return if symptoms worsen or fail to improve.   Procedures: No procedures performed  EMG & NCV Findings: Evaluation of the left median (across palm) sensory nerve showed prolonged distal peak latency (Wrist, 4.2 ms).  All remaining nerves (as indicated in the following tables) were within normal limits.    Needle evaluation of the left biceps muscle showed increased insertional activity.  The left deltoid and the left triceps muscles showed increased insertional activity and diminished recruitment.  All remaining muscles (as indicated in the following table) showed no evidence of electrical instability.    Impression: The above electrodiagnostic study is ABNORMAL and reveals evidence of essentially a mild "double crush" phenomena with 1.) a mild left median nerve entrapment at the wrist (carpal tunnel syndrome) affecting sensory components and 2.) a mild chronic C6 radiculopathy on the left.  No significant nerve damage noted in either case.   There is no significant electrodiagnostic evidence of any other focal nerve entrapment, brachial plexopathy or generalized peripheral neuropathy.   Recommendations: 1.  Follow-up with referring physician. 2.  Continue current management of symptoms, suggest wearing splint on the wrist at night and if the radicular pain declares itself and worsens then cervical imaging with x-ray and possible  MRI.   Nerve Conduction Studies Anti Sensory Summary Table   Stim Site NR Peak (ms) Norm Peak (ms) P-T Amp (V) Norm P-T Amp Site1 Site2 Delta-P (ms) Dist (cm) Vel (m/s) Norm Vel (m/s)  Left Median Acr Palm Anti Sensory (2nd Digit)  32.2C  Wrist     *4.2 <3.6 16.8 >10 Wrist Palm 2.4 0.0    Palm    1.8 <2.0 6.8         Left Radial Anti Sensory (Base 1st Digit)  32.9C  Wrist    1.8 <3.1 16.8  Wrist Base 1st Digit 1.8 0.0    Left Ulnar Anti Sensory (5th Digit)  32.8C  Wrist    3.1 <3.7 18.3 >15.0 Wrist 5th Digit 3.1 14.0 45 >38   Motor Summary Table   Stim Site NR Onset (ms) Norm Onset (ms) O-P Amp (mV) Norm O-P Amp Site1 Site2 Delta-0 (ms) Dist (cm) Vel (m/s) Norm Vel (m/s)  Left Median Motor (Abd Poll Brev)  32.9C  Wrist    4.0 <4.2 9.5 >5 Elbow Wrist 4.7 25.0 53 >50  Elbow    8.7  8.9         Left Ulnar Motor (Abd Dig Min)  32.9C  Wrist    3.0 <4.2 9.4 >3 B Elbow Wrist 3.9 24.5 63 >53  B Elbow    6.9  8.2  A Elbow B Elbow 1.5 11.5 77 >53  A Elbow    8.4  8.6          EMG   Side Muscle Nerve Root Ins Act Fibs Psw Amp Dur Poly Recrt Int Fraser Din Comment  Left 1stDorInt Ulnar C8-T1 Nml Nml Nml Nml Nml 0 Nml Nml   Left Biceps Musculocut C5-6 *Incr Nml Nml Nml Nml 0 Nml Nml   Left Deltoid Axillary C5-6 *Incr Nml Nml Nml Nml 0 *Reduced Nml   Left Triceps Radial C6-7-8 *Incr Nml Nml Nml Nml 0 *Reduced Nml   Left Ext Digitorum  Radial (Post Int) C7-8 Nml Nml Nml Nml Nml 0 Nml Nml     Nerve Conduction Studies Anti Sensory Left/Right Comparison   Stim Site L Lat (ms) R Lat (ms) L-R Lat (ms) L Amp (V) R Amp (V) L-R Amp (%) Site1 Site2 L Vel (m/s) R Vel (m/s) L-R Vel (m/s)  Median Acr Palm Anti Sensory (2nd Digit)  32.2C  Wrist *4.2   16.8   Wrist Palm     Palm 1.8   6.8         Radial Anti Sensory (Base 1st Digit)  32.9C  Wrist 1.8   16.8   Wrist Base 1st Digit     Ulnar Anti Sensory (5th Digit)  32.8C  Wrist 3.1   18.3   Wrist 5th Digit 45     Motor Left/Right Comparison   Stim Site L Lat (ms) R Lat (ms) L-R Lat (ms) L Amp (mV) R Amp (mV) L-R Amp (%) Site1 Site2 L Vel (m/s) R Vel (m/s) L-R Vel (m/s)  Median Motor (Abd Poll Brev)  32.9C  Wrist 4.0   9.5   Elbow Wrist 53    Elbow 8.7   8.9         Ulnar Motor (Abd Dig Min)   32.9C  Wrist 3.0   9.4   B Elbow Wrist 63    B Elbow 6.9   8.2   A Elbow B Elbow 77    A Elbow 8.4   8.6  Clinical History: No specialty comments available.  He reports that he has never smoked. He has never used smokeless tobacco. No results for input(s): HGBA1C, LABURIC in the last 8760 hours.  Objective:  VS:  HT:    WT:   BMI:     BP:   HR: bpm  TEMP: ( )  RESP:  Physical Exam  Musculoskeletal:  Examination of both hand shows no atrophy or allodynia or synovitis. There is a negative Tinel's at the wrist. There is intact sensation to light touch.    Ortho Exam Imaging: No results found.  Past Medical/Family/Surgical/Social History: Medications & Allergies reviewed per EMR Patient Active Problem List   Diagnosis Date Noted  . Hyperuricemia 05/09/2016  . Family history of colon cancer 05/09/2016  . Left medial knee pain 03/12/2015  . 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 Exogenous obesity 09/11/2011  . Impaired glucose tolerance 09/11/2011  . History of colonic polyps 09/11/2011  . NEPHROLITHIASIS, HX OF 08/20/2008  . Osteoarthrosis, unspecified whether generalized or localized, unspecified site 01/03/2007  . LUMBAR DISC DISORDER 01/03/2007   Past Medical History:  Diagnosis Date  . Acute meniscal tear of left knee   . Allergy    fire ants  . Complication of anesthesia   . GERD (gastroesophageal reflux disease)   . History of kidney stones   . Hypertension   . PONV (postoperative nausea and vomiting)    Family History  Problem Relation Age of Onset  . Colon cancer Father    Past Surgical History:  Procedure Laterality Date  . BACK SURGERY     laminectomy L5-S1  . Colonscopy     polyps  . CYSTOSCOPY KIDNEY W/ URETERAL GUIDE WIRE  2010   lithotripsy  . CYSTOSCOPY/RETROGRADE/URETEROSCOPY/STONE EXTRACTION WITH BASKET Right 02/16/2016   Procedure: CYSTOSCOPY/RETROGRADE/RIGHT FLEXIBLE URETEROSCOPY/STONE EXTRACTION WITH BASKET;   Surgeon: Raynelle Bring, MD;  Location: [facility];  Service: Urology;  Laterality: Right;  . HOLMIUM LASER APPLICATION Left 04/25/7351   Procedure: HOLMIUM LASER APPLICATION;  Surgeon: Raynelle Bring, MD;  Location: [facility];  Service: Urology;  Laterality: Left;  . KNEE ARTHROSCOPY Right 2013  . KNEE ARTHROSCOPY WITH MEDIAL MENISECTOMY Left 03/12/2015   Procedure: LEFT KNEE ARTHROSCOPY WITH PARTIAL MEDIAL MENISCECTOMY;  Surgeon: Mcarthur Rossetti, MD;  Location: [facility];  Service: Orthopedics;  Laterality: Left;  . LAMINECTOMY  1999  . MR HIPS BILATERAL     2009&2010  . TOTAL HIP REVISION  09/15/2011   Procedure: TOTAL HIP REVISION;  Surgeon: Mcarthur Rossetti, MD;  Location: [place];  Service: Orthopedics;  Laterality: Right;  Revision right hip acetabular component   Social History   Occupational History  . Not on file.   Social History Main Topics  . Smoking status: Never Smoker  . Smokeless tobacco: Never Used  . Alcohol use 4.2 oz/week    7 Glasses of wine per week     Comment: glass of wine per night  . Drug use: No  . Sexual activity: Not on file

## 2016-05-15 NOTE — Progress Notes (Deleted)
Patient is complaining of left arm pain for several months.

## 2016-05-16 NOTE — Procedures (Signed)
EMG & NCV Findings: Evaluation of the left median (across palm) sensory nerve showed prolonged distal peak latency (Wrist, 4.2 ms).  All remaining nerves (as indicated in the following tables) were within normal limits.    Needle evaluation of the left biceps muscle showed increased insertional activity.  The left deltoid and the left triceps muscles showed increased insertional activity and diminished recruitment.  All remaining muscles (as indicated in the following table) showed no evidence of electrical instability.    Impression: The above electrodiagnostic study is ABNORMAL and reveals evidence of essentially a mild "double crush" phenomena with 1.) a mild left median nerve entrapment at the wrist (carpal tunnel syndrome) affecting sensory components and 2.) a mild chronic C6 radiculopathy on the left.  No significant nerve damage noted in either case.   There is no significant electrodiagnostic evidence of any other focal nerve entrapment, brachial plexopathy or generalized peripheral neuropathy.   Recommendations: 1.  Follow-up with referring physician. 2.  Continue current management of symptoms, suggest wearing splint on the wrist at night and if the radicular pain declares itself and worsens then cervical imaging with x-ray and possible MRI.   Nerve Conduction Studies Anti Sensory Summary Table   Stim Site NR Peak (ms) Norm Peak (ms) P-T Amp (V) Norm P-T Amp Site1 Site2 Delta-P (ms) Dist (cm) Vel (m/s) Norm Vel (m/s)  Left Median Acr Palm Anti Sensory (2nd Digit)  32.2C  Wrist    *4.2 <3.6 16.8 >10 Wrist Palm 2.4 0.0    Palm    1.8 <2.0 6.8         Left Radial Anti Sensory (Base 1st Digit)  32.9C  Wrist    1.8 <3.1 16.8  Wrist Base 1st Digit 1.8 0.0    Left Ulnar Anti Sensory (5th Digit)  32.8C  Wrist    3.1 <3.7 18.3 >15.0 Wrist 5th Digit 3.1 14.0 45 >38   Motor Summary Table   Stim Site NR Onset (ms) Norm Onset (ms) O-P Amp (mV) Norm O-P Amp Site1 Site2 Delta-0 (ms)  Dist (cm) Vel (m/s) Norm Vel (m/s)  Left Median Motor (Abd Poll Brev)  32.9C  Wrist    4.0 <4.2 9.5 >5 Elbow Wrist 4.7 25.0 53 >50  Elbow    8.7  8.9         Left Ulnar Motor (Abd Dig Min)  32.9C  Wrist    3.0 <4.2 9.4 >3 B Elbow Wrist 3.9 24.5 63 >53  B Elbow    6.9  8.2  A Elbow B Elbow 1.5 11.5 77 >53  A Elbow    8.4  8.6          EMG   Side Muscle Nerve Root Ins Act Fibs Psw Amp Dur Poly Recrt Int Fraser Din Comment  Left 1stDorInt Ulnar C8-T1 Nml Nml Nml Nml Nml 0 Nml Nml   Left Biceps Musculocut C5-6 *Incr Nml Nml Nml Nml 0 Nml Nml   Left Deltoid Axillary C5-6 *Incr Nml Nml Nml Nml 0 *Reduced Nml   Left Triceps Radial C6-7-8 *Incr Nml Nml Nml Nml 0 *Reduced Nml   Left Ext Digitorum  Radial (Post Int) C7-8 Nml Nml Nml Nml Nml 0 Nml Nml     Nerve Conduction Studies Anti Sensory Left/Right Comparison   Stim Site L Lat (ms) R Lat (ms) L-R Lat (ms) L Amp (V) R Amp (V) L-R Amp (%) Site1 Site2 L Vel (m/s) R Vel (m/s) L-R Vel (m/s)  Median Acr Palm Anti  Sensory (2nd Digit)  32.2C  Wrist *4.2   16.8   Wrist Palm     Palm 1.8   6.8         Radial Anti Sensory (Base 1st Digit)  32.9C  Wrist 1.8   16.8   Wrist Base 1st Digit     Ulnar Anti Sensory (5th Digit)  32.8C  Wrist 3.1   18.3   Wrist 5th Digit 45     Motor Left/Right Comparison   Stim Site L Lat (ms) R Lat (ms) L-R Lat (ms) L Amp (mV) R Amp (mV) L-R Amp (%) Site1 Site2 L Vel (m/s) R Vel (m/s) L-R Vel (m/s)  Median Motor (Abd Poll Brev)  32.9C  Wrist 4.0   9.5   Elbow Wrist 53    Elbow 8.7   8.9         Ulnar Motor (Abd Dig Min)  32.9C  Wrist 3.0   9.4   B Elbow Wrist 63    B Elbow 6.9   8.2   A Elbow B Elbow 77    A Elbow 8.4   8.6

## 2016-05-17 ENCOUNTER — Telehealth: Payer: Self-pay | Admitting: Internal Medicine

## 2016-05-17 NOTE — Telephone Encounter (Signed)
Pathology reports from 09/05/11 and 04/29/10 were faxe dover to [facility]

## 2016-05-17 NOTE — Telephone Encounter (Signed)
Keith Brown from [facility] called stated that the MD would be happy to see the patient but MD is requesting the pathology report before scheduling appointment.  Contact Info: [facility] [phone]

## 2016-06-15 ENCOUNTER — Other Ambulatory Visit: Payer: Self-pay | Admitting: Internal Medicine

## 2016-06-17 NOTE — Addendum Note (Signed)
Addendum  created 06/17/16 0853 by Duane Boston, MD   Sign clinical note

## 2016-08-02 ENCOUNTER — Other Ambulatory Visit: Payer: Self-pay | Admitting: Gastroenterology

## 2016-08-16 ENCOUNTER — Other Ambulatory Visit (INDEPENDENT_AMBULATORY_CARE_PROVIDER_SITE_OTHER): Payer: Self-pay | Admitting: Orthopaedic Surgery

## 2016-08-16 DIAGNOSIS — M25551 Pain in right hip: Secondary | ICD-10-CM

## 2016-08-17 ENCOUNTER — Other Ambulatory Visit (INDEPENDENT_AMBULATORY_CARE_PROVIDER_SITE_OTHER): Payer: 59

## 2016-08-17 DIAGNOSIS — M25551 Pain in right hip: Secondary | ICD-10-CM

## 2016-09-15 ENCOUNTER — Other Ambulatory Visit: Payer: Self-pay | Admitting: *Deleted

## 2016-09-15 MED ORDER — HYDROCHLOROTHIAZIDE 12.5 MG PO TABS: 12.5000 mg | ORAL_TABLET | Freq: Every day | ORAL | 1 refills | Status: DC

## 2016-09-15 NOTE — Telephone Encounter (Signed)
Patient called requesting a refill on HCTZ to be sent to [facility].  Rx was sent and the pt is aware of this.

## 2016-09-21 DIAGNOSIS — H10501 Unspecified blepharoconjunctivitis, right eye: Secondary | ICD-10-CM | POA: Diagnosis not present

## 2016-10-05 ENCOUNTER — Encounter: Payer: Self-pay | Admitting: Internal Medicine

## 2016-10-13 ENCOUNTER — Encounter (HOSPITAL_COMMUNITY): Payer: Self-pay

## 2016-10-13 ENCOUNTER — Ambulatory Visit (HOSPITAL_COMMUNITY): Admit: 2016-10-13 | Payer: 59 | Admitting: Gastroenterology

## 2016-10-13 SURGERY — COLONOSCOPY
Anesthesia: Moderate Sedation

## 2016-12-19 ENCOUNTER — Telehealth: Payer: Self-pay | Admitting: Internal Medicine

## 2016-12-19 ENCOUNTER — Other Ambulatory Visit: Payer: Self-pay | Admitting: *Deleted

## 2016-12-19 MED ORDER — ALLOPURINOL 100 MG PO TABS: 100.0000 mg | ORAL_TABLET | Freq: Every day | ORAL | 6 refills | Status: DC

## 2016-12-19 NOTE — Telephone Encounter (Signed)
Copied from [place]. Topic: Quick Communication - Rx Refill/Question >> Dec 19, 2016  9:31 AM Ahmed Prima L wrote: Has the patient contacted their pharmacy? yes   (Agent: If no, request that the patient contact the pharmacy for the refill.)   Preferred Pharmacy (with phone number or street name): [facility]   Agent: Please be advised that RX refills may take up to 3 business days. We ask that you follow-up with your pharmacy.' Refill on ALLOPURINOL 100mg 

## 2017-01-23 ENCOUNTER — Encounter: Payer: Self-pay | Admitting: Internal Medicine

## 2017-01-23 ENCOUNTER — Ambulatory Visit (INDEPENDENT_AMBULATORY_CARE_PROVIDER_SITE_OTHER): Payer: 59 | Admitting: Internal Medicine

## 2017-01-23 VITALS — BP 140/70 | HR 79 | Temp 98.6°F | Ht 69.0 in | Wt 316.2 lb

## 2017-01-23 DIAGNOSIS — Z86010 Personal history of colonic polyps: Secondary | ICD-10-CM | POA: Diagnosis not present

## 2017-01-23 DIAGNOSIS — E790 Hyperuricemia without signs of inflammatory arthritis and tophaceous disease: Secondary | ICD-10-CM | POA: Diagnosis not present

## 2017-01-23 DIAGNOSIS — J392 Other diseases of pharynx: Secondary | ICD-10-CM | POA: Diagnosis not present

## 2017-01-23 DIAGNOSIS — R7302 Impaired glucose tolerance (oral): Secondary | ICD-10-CM | POA: Diagnosis not present

## 2017-01-23 DIAGNOSIS — Z87442 Personal history of urinary calculi: Secondary | ICD-10-CM | POA: Diagnosis not present

## 2017-01-23 DIAGNOSIS — Z800 Family history of malignant neoplasm of digestive organs: Secondary | ICD-10-CM | POA: Diagnosis not present

## 2017-01-23 NOTE — Progress Notes (Signed)
Subjective:    Patient ID: Keith Brown, male    DOB: [birth year], 62 y.o.   MRN: [id number]  HPI  62 year old patient, seen today for follow-up.  He has been followed by urology due to renal colic.  In January he required cystoscopy with retrograde ureteroscopy and stone extraction.  Creatinine was elevated at that time in the setting of renal colic. Stone analysis revealed a mixed calcium oxalate uric acid composition.  He has been on hydrochlorothiazide as well as allopurinol for recurrent stone prevention.  He has a history of colonic polyps and family history of colon cancer.  He was planned for follow-up colonoscopy in September which was canceled due to the poor health and death of his father.  Family history.  Father died at 79 history of metastatic colon cancer terminal he had bone marrow failure.  Mother died at 46 with cerebrovascular disease and renal failure.  Status post permanent pacemaker one brother died of complications of a stroke with morbid obesity He has 5 brothers and 2 sisters positive for OSA one sister with severe bipolar depression one sister died in a motor vehicle accident  Past Medical History:  Diagnosis Date  . Acute meniscal tear of left knee   . Allergy    fire ants  . Complication of anesthesia   . GERD (gastroesophageal reflux disease)   . History of kidney stones   . Hypertension   . PONV (postoperative nausea and vomiting)      Social History   Socioeconomic History  . Marital status: Married    Spouse name: Not on file  . Number of children: Not on file  . Years of education: Not on file  . Highest education level: Not on file  Social Needs  . Financial resource strain: Not on file  . Food insecurity - worry: Not on file  . Food insecurity - inability: Not on file  . Transportation needs - medical: Not on file  . Transportation needs - non-medical: Not on file  Occupational History  . Not on file  Tobacco Use  . Smoking status: Never  Smoker  . Smokeless tobacco: Never Used  Substance and Sexual Activity  . Alcohol use: Yes    Alcohol/week: 4.2 oz    Types: 7 Glasses of wine per week    Comment: glass of wine per night  . Drug use: No  . Sexual activity: Not on file  Other Topics Concern  . Not on file  Social History Narrative  . Not on file    Past Surgical History:  Procedure Laterality Date  . BACK SURGERY     laminectomy L5-S1  . Colonscopy     polyps  . CYSTOSCOPY KIDNEY W/ URETERAL GUIDE WIRE  2010   lithotripsy  . CYSTOSCOPY/RETROGRADE/URETEROSCOPY/STONE EXTRACTION WITH BASKET Right 02/16/2016   Procedure: CYSTOSCOPY/RETROGRADE/RIGHT FLEXIBLE URETEROSCOPY/STONE EXTRACTION WITH BASKET;  Surgeon: Raynelle Bring, MD;  Location: [facility];  Service: Urology;  Laterality: Right;  . HOLMIUM LASER APPLICATION Left 3/97/6734   Procedure: HOLMIUM LASER APPLICATION;  Surgeon: Raynelle Bring, MD;  Location: [facility];  Service: Urology;  Laterality: Left;  . KNEE ARTHROSCOPY Right 2013  . KNEE ARTHROSCOPY WITH MEDIAL MENISECTOMY Left 03/12/2015   Procedure: LEFT KNEE ARTHROSCOPY WITH PARTIAL MEDIAL MENISCECTOMY;  Surgeon: Mcarthur Rossetti, MD;  Location: [facility];  Service: Orthopedics;  Laterality: Left;  . LAMINECTOMY  1999  . MR HIPS BILATERAL     2009&2010  . TOTAL HIP REVISION  09/15/2011  Procedure: TOTAL HIP REVISION;  Surgeon: Mcarthur Rossetti, MD;  Location: [facility];  Service: Orthopedics;  Laterality: Right;  Revision right hip acetabular component    Family History  Problem Relation Age of Onset  . Colon cancer Father     No Known Allergies  Current Outpatient Medications on File Prior to Visit  Medication Sig Dispense Refill  . allopurinol (ZYLOPRIM) 100 MG tablet Take 1 tablet (100 mg total) by mouth daily. 30 tablet 6  . hydrochlorothiazide (HYDRODIURIL) 12.5 MG tablet Take 1 tablet (12.5 mg total) by mouth daily. 90 tablet 1  . Loratadine (CLARITIN) 10 MG CAPS Take 10 mg by mouth daily.       No current facility-administered medications on file prior to visit.     BP 140/70 (BP Location: Left Arm, Patient Position: Sitting, Cuff Size: Large)   Pulse 79   Temp 98.6 F (37 C) (Oral)   Ht 5\' 9"  (1.753 m)   Wt (!) 316 lb 3.2 oz (143.4 kg)   SpO2 97%   BMI 46.69 kg/m     Review of Systems  Constitutional: Negative for appetite change, chills, fatigue and fever.  HENT: Negative for congestion, dental problem, ear pain, hearing loss, sore throat, tinnitus, trouble swallowing and voice change.   Eyes: Negative for pain, discharge and visual disturbance.  Respiratory: Negative for cough, chest tightness, wheezing and stridor.   Cardiovascular: Negative for chest pain, palpitations and leg swelling.  Gastrointestinal: Negative for abdominal distention, abdominal pain, blood in stool, constipation, diarrhea, nausea and vomiting.  Genitourinary: Negative for difficulty urinating, discharge, flank pain, genital sores, hematuria and urgency.  Musculoskeletal: Positive for arthralgias and back pain. Negative for gait problem, joint swelling, myalgias and neck stiffness.  Skin: Negative for rash.  Neurological: Negative for dizziness, syncope, speech difficulty, weakness, numbness and headaches.  Hematological: Negative for adenopathy. Does not bruise/bleed easily.  Psychiatric/Behavioral: Negative for behavioral problems and dysphoric mood. The patient is not nervous/anxious.        Objective:   Physical Exam  Constitutional: He is oriented to person, place, and time. He appears well-developed and well-nourished.  Weight 316 Blood pressure repeat 126/70  HENT:  Head: Normocephalic.  Right Ear: External ear normal.  Left Ear: External ear normal.  3-4 mm gray benign-appearing papular lesion involving the left pharyngeal arch  Eyes: Conjunctivae and EOM are normal.  Neck: Normal range of motion.  Cardiovascular: Normal rate and normal heart sounds.  Pulmonary/Chest: Breath  sounds normal.  Abdominal: Bowel sounds are normal.  Musculoskeletal: Normal range of motion. He exhibits no edema or tenderness.  Neurological: He is alert and oriented to person, place, and time.  Psychiatric: He has a normal mood and affect. His behavior is normal.          Assessment & Plan:   Renal stone disease.  Will check uric acid level consider up titrating allopurinol.  Continue hydrochlorothiazide Obesity.  Weight loss encouraged History of colonic polyps.  Will reschedule colonoscopy Left pharyngeal lesion.  Schedule ENT evaluation  Check screening lab to include creatinine uric acid as well as hemoglobin A1c  CPX 6 months  [id number],PETER Pilar Plate

## 2017-01-23 NOTE — Patient Instructions (Signed)
ENT referral as discussed  Schedule your colonoscopy to help detect colon cancer.  Return in 6 months for follow-up

## 2017-01-24 DIAGNOSIS — H2513 Age-related nuclear cataract, bilateral: Secondary | ICD-10-CM | POA: Diagnosis not present

## 2017-01-24 DIAGNOSIS — H524 Presbyopia: Secondary | ICD-10-CM | POA: Diagnosis not present

## 2017-01-24 DIAGNOSIS — H5213 Myopia, bilateral: Secondary | ICD-10-CM | POA: Diagnosis not present

## 2017-01-24 LAB — BASIC METABOLIC PANEL
BUN: 19 mg/dL (ref 6–23)
CHLORIDE: 107 meq/L (ref 96–112)
CO2: 26 meq/L (ref 19–32)
CREATININE: 0.98 mg/dL (ref 0.40–1.50)
Calcium: 9.6 mg/dL (ref 8.4–10.5)
GFR: 82.54 mL/min (ref >60.00)
Glucose, Bld: 92 mg/dL (ref 70–99)
POTASSIUM: 4.1 meq/L (ref 3.5–5.1)
Sodium: 142 mEq/L (ref 135–145)

## 2017-01-24 LAB — HEMOGLOBIN A1C: HEMOGLOBIN A1C: 5.6 % (ref 4.6–6.5)

## 2017-01-24 LAB — URIC ACID: Uric Acid, Serum: 7.5 mg/dL (ref 4.0–7.8)

## 2017-01-31 ENCOUNTER — Other Ambulatory Visit: Payer: Self-pay | Admitting: Gastroenterology

## 2017-01-31 ENCOUNTER — Other Ambulatory Visit: Payer: Self-pay | Admitting: Internal Medicine

## 2017-01-31 ENCOUNTER — Telehealth: Payer: Self-pay | Admitting: Family Medicine

## 2017-01-31 MED ORDER — ALLOPURINOL 100 MG PO TABS: 200.0000 mg | ORAL_TABLET | Freq: Every day | ORAL | 6 refills | Status: DC

## 2017-01-31 NOTE — Telephone Encounter (Signed)
Medication was escribed to the pharmacy.  

## 2017-01-31 NOTE — Telephone Encounter (Signed)
Pt requesting a script for Allopurinol, said Dr. Raliegh Ip increased his dose to 200 mg every day said he will run out today, please send to [facility].

## 2017-02-06 DIAGNOSIS — J3501 Chronic tonsillitis: Secondary | ICD-10-CM | POA: Diagnosis not present

## 2017-03-02 ENCOUNTER — Other Ambulatory Visit: Payer: Self-pay

## 2017-03-02 ENCOUNTER — Encounter (HOSPITAL_COMMUNITY): Payer: Self-pay | Admitting: *Deleted

## 2017-03-05 ENCOUNTER — Other Ambulatory Visit: Payer: Self-pay | Admitting: Gastroenterology

## 2017-03-14 ENCOUNTER — Ambulatory Visit (HOSPITAL_COMMUNITY): Payer: 59 | Admitting: Anesthesiology

## 2017-03-14 ENCOUNTER — Encounter (HOSPITAL_COMMUNITY): Payer: Self-pay

## 2017-03-14 ENCOUNTER — Ambulatory Visit (HOSPITAL_COMMUNITY)
Admission: RE | Admit: 2017-03-14 | Discharge: 2017-03-14 | Disposition: A | Discharge status: Home / Self Care | Payer: 59 | Source: Ambulatory Visit | Attending: Gastroenterology | Admitting: Gastroenterology

## 2017-03-14 ENCOUNTER — Encounter (HOSPITAL_COMMUNITY)
Admission: RE | Disposition: A | Discharge status: Home / Self Care | Payer: Self-pay | Source: Ambulatory Visit | Attending: Gastroenterology

## 2017-03-14 DIAGNOSIS — Z1211 Encounter for screening for malignant neoplasm of colon: Secondary | ICD-10-CM | POA: Insufficient documentation

## 2017-03-14 DIAGNOSIS — Z86010 Personal history of colonic polyps: Secondary | ICD-10-CM | POA: Diagnosis not present

## 2017-03-14 DIAGNOSIS — I10 Essential (primary) hypertension: Secondary | ICD-10-CM | POA: Insufficient documentation

## 2017-03-14 DIAGNOSIS — K219 Gastro-esophageal reflux disease without esophagitis: Secondary | ICD-10-CM | POA: Diagnosis not present

## 2017-03-14 DIAGNOSIS — D123 Benign neoplasm of transverse colon: Secondary | ICD-10-CM | POA: Insufficient documentation

## 2017-03-14 DIAGNOSIS — K648 Other hemorrhoids: Secondary | ICD-10-CM | POA: Insufficient documentation

## 2017-03-14 DIAGNOSIS — Z6841 Body Mass Index (BMI) 40.0 and over, adult: Secondary | ICD-10-CM | POA: Diagnosis not present

## 2017-03-14 DIAGNOSIS — K5730 Diverticulosis of large intestine without perforation or abscess without bleeding: Secondary | ICD-10-CM | POA: Insufficient documentation

## 2017-03-14 DIAGNOSIS — Z800 Family history of malignant neoplasm of digestive organs: Secondary | ICD-10-CM | POA: Diagnosis not present

## 2017-03-14 HISTORY — PX: COLONOSCOPY WITH PROPOFOL: SHX5780

## 2017-03-14 SURGERY — COLONOSCOPY WITH PROPOFOL
Anesthesia: Monitor Anesthesia Care

## 2017-03-14 MED ORDER — PROPOFOL 10 MG/ML IV BOLUS
INTRAVENOUS | Status: AC
  Filled 2017-03-14: qty 20

## 2017-03-14 MED ORDER — LACTATED RINGERS IV SOLN
INTRAVENOUS | Status: DC
  Administered 2017-03-14: 1000 mL via INTRAVENOUS
  Administered 2017-03-14: 13:00:00 via INTRAVENOUS

## 2017-03-14 MED ORDER — PROPOFOL 10 MG/ML IV BOLUS
INTRAVENOUS | Status: DC | PRN
  Administered 2017-03-14 (×21): 20 mg via INTRAVENOUS

## 2017-03-14 MED ORDER — ONDANSETRON HCL 4 MG/2ML IJ SOLN
INTRAMUSCULAR | Status: DC | PRN
  Administered 2017-03-14: 4 mg via INTRAVENOUS

## 2017-03-14 MED ORDER — SODIUM CHLORIDE 0.9 % IV SOLN: INTRAVENOUS | Status: DC

## 2017-03-14 MED ORDER — LIDOCAINE 2% (20 MG/ML) 5 ML SYRINGE
INTRAMUSCULAR | Status: DC | PRN
  Administered 2017-03-14: 40 mg via INTRAVENOUS

## 2017-03-14 MED ORDER — PROPOFOL 10 MG/ML IV BOLUS
INTRAVENOUS | Status: AC
  Filled 2017-03-14: qty 40

## 2017-03-14 SURGICAL SUPPLY — 22 items

## 2017-03-14 NOTE — Anesthesia Procedure Notes (Signed)
Procedure Name: MAC Date/Time: 03/14/2017 1:05 PM Performed by: Cynda Familia, CRNA Pre-anesthesia Checklist: Patient identified, Emergency Drugs available, Suction available, Patient being monitored and Timeout performed Patient Re-evaluated:Patient Re-evaluated prior to induction Oxygen Delivery Method: Simple face mask Placement Confirmation: positive ETCO2 and breath sounds checked- equal and bilateral Dental Injury: Teeth and Oropharynx as per pre-operative assessment

## 2017-03-14 NOTE — Discharge Instructions (Signed)
Call if question or problem otherwise call in 1 week for biopsy report and follow-up as needed and probable repeat colonoscopy in 5 years  YOU HAD AN ENDOSCOPIC PROCEDURE TODAY: Refer to the procedure report and other information in the discharge instructions given to you for any specific questions about what was found during the examination. If this information does not answer your questions, please call [facility] office at [phone] to clarify.   YOU SHOULD EXPECT: Some feelings of bloating in the abdomen. Passage of more gas than usual. Walking can help get rid of the air that was put into your GI tract during the procedure and reduce the bloating. If you had a lower endoscopy (such as a colonoscopy or flexible sigmoidoscopy) you may notice spotting of blood in your stool or on the toilet paper. Some abdominal soreness may be present for a day or two, also.  DIET: Your first meal following the procedure should be a light meal and then it is ok to progress to your normal diet. A half-sandwich or bowl of soup is an example of a good first meal. Heavy or fried foods are harder to digest and may make you feel nauseous or bloated. Drink plenty of fluids but you should avoid alcoholic beverages for 24 hours. If you had a esophageal dilation, please see attached instructions for diet.   ACTIVITY: Your care partner should take you home directly after the procedure. You should plan to take it easy, moving slowly for the rest of the day. You can resume normal activity the day after the procedure however YOU SHOULD NOT DRIVE, use power tools, machinery or perform tasks that involve climbing or major physical exertion for 24 hours (because of the sedation medicines used during the test).   SYMPTOMS TO REPORT IMMEDIATELY: A gastroenterologist can be reached at any hour. Please call [phone]  for any of the following symptoms:  Following lower endoscopy (colonoscopy, flexible sigmoidoscopy) Excessive amounts  of blood in the stool  Significant tenderness, worsening of abdominal pains  Swelling of the abdomen that is new, acute  Fever of 100 or higher  Following upper endoscopy (EGD, EUS, ERCP, esophageal dilation) Vomiting of blood or coffee ground material  New, significant abdominal pain  New, significant chest pain or pain under the shoulder blades  Painful or persistently difficult swallowing  New shortness of breath  Black, tarry-looking or red, bloody stools  FOLLOW UP:  If any biopsies were taken you will be contacted by phone or by letter within the next 1-3 weeks. Call [phone]  if you have not heard about the biopsies in 3 weeks.  Please also call with any specific questions about appointments or follow up tests.

## 2017-03-14 NOTE — Anesthesia Postprocedure Evaluation (Signed)
Anesthesia Post Note  Patient: Keith Oto, MD  Procedure(s) Performed: COLONOSCOPY WITH PROPOFOL (N/A )     Patient location during evaluation: Endoscopy Anesthesia Type: MAC Level of consciousness: awake and alert Pain management: pain level controlled Vital Signs Assessment: post-procedure vital signs reviewed and stable Respiratory status: spontaneous breathing, nonlabored ventilation, respiratory function stable and patient connected to nasal cannula oxygen Cardiovascular status: stable and blood pressure returned to baseline Postop Assessment: no apparent nausea or vomiting Anesthetic complications: no    Last Vitals:  Vitals:   03/14/17 1400 03/14/17 1410  BP: 136/74 98/81  Pulse: 61 64  Resp: (!) 28 14  Temp:    SpO2: 100% 99%    Last Pain:  Vitals:   03/14/17 1357  TempSrc: Oral                 Keith Brown,Keith Brown

## 2017-03-14 NOTE — Progress Notes (Signed)
Keith Oto, MD 12:54 PM  Subjective: Patient without any GI complaints and no problem with the prep and no new medical problems since we have seen him in the office  Objective: Vital signs stable afebrile no acute distress exam please see preassessment evaluation  Assessment: Personal history of colon polyps family history of colon cancer due for repeat screening  Plan: Okay to proceed with colonoscopy with anesthesia assistance  [facility]  Pager [phone] After 5PM or if no answer call [phone]

## 2017-03-14 NOTE — Op Note (Signed)
[facility] Patient Name: Keith Brown Procedure Date: 03/14/2017 MRN: [id number] Attending MD: Clarene Essex , MD Date of Birth: [birth year] CSN: [id number] Age: 62 Admit Type: Outpatient Procedure:                Colonoscopy Indications:              High risk colon cancer surveillance: Personal                            history of colonic polyps, Last colonoscopy: April                            2012, Family history of colon cancer in a                            first-degree relative Providers:                Clarene Essex, MD, Cleda Daub, RN, Tinnie Gens,                            Technician, Glenis Smoker, CRNA Referring MD:              Medicines:                Ondansetron 4 mg IV, Propofol total dose 420 mg                            IV,40mg  lidocaine IV Complications:            No immediate complications. Estimated Blood Loss:     Estimated blood loss: none. Procedure:                Pre-Anesthesia Assessment:                           - Prior to the procedure, a History and Physical                            was performed, and patient medications and                            allergies were reviewed. The patient's tolerance of                            previous anesthesia was also reviewed. The risks                            and benefits of the procedure and the sedation                            options and risks were discussed with the patient.                            All questions were answered, and informed consent  was obtained. Prior Anticoagulants: The patient has                            taken no previous anticoagulant or antiplatelet                            agents. ASA Grade Assessment: II - A patient with                            mild systemic disease. After reviewing the risks                            and benefits, the patient was deemed in                            satisfactory condition to undergo  the procedure.                           After obtaining informed consent, the colonoscope                            was passed under direct vision. Throughout the                            procedure, the patient's blood pressure, pulse, and                            oxygen saturations were monitored continuously. The                            EC-[id number] ([id number]) scope was introduced through                            the anus and advanced to the the terminal ileum.                            The terminal ileum, ileocecal valve, appendiceal                            orifice, and rectum were photographed. The                            colonoscopy was performed without difficulty. The                            patient tolerated the procedure well. The quality                            of the bowel preparation was adequate. abdominal                            pressure was applied Scope In: 1:16:01 PM Scope Out: 1:43:37 PM Scope Withdrawal Time: 0 hours 19 minutes 47 seconds  Total Procedure Duration: 0 hours  27 minutes 36 seconds  Findings:      Internal hemorrhoids were found during retroflexion, during perianal       exam and during digital exam. The hemorrhoids were small.      A few small-mouthed diverticula were found in the sigmoid colon.      Three semi-sessile polyps were found in the transverse colon and hepatic       flexure. The polyps were diminutive in size. These were biopsied with a       cold forceps for histology.      The terminal ileum appeared normal.      The exam was otherwise without abnormality. Impression:               - Internal hemorrhoids.                           - Diverticulosis in the sigmoid colon.                           - Three diminutive polyps in the transverse colon                            and at the hepatic flexure. Biopsied.                           - The examined portion of the ileum was normal.                           - The  examination was otherwise normal. Moderate Sedation:      N/A- Per Anesthesia Care Recommendation:           - Patient has a contact number available for                            emergencies. The signs and symptoms of potential                            delayed complications were discussed with the                            patient. Return to normal activities tomorrow.                            Written discharge instructions were provided to the                            patient.                           - Soft diet today.                           - Continue present medications.                           - Await pathology results.                           -  Repeat colonoscopy in 5 years for surveillance                            based on pathology results.                           - Return to GI office PRN.                           - Telephone GI clinic for pathology results in 1                            week.                           - Telephone GI clinic if symptomatic PRN. Procedure Code(s):        --- Professional ---                           [phone], Colonoscopy, flexible; with biopsy, single                            or multiple Diagnosis Code(s):        --- Professional ---                           Z86.010, Personal history of colonic polyps                           D12.3, Benign neoplasm of transverse colon (hepatic                            flexure or splenic flexure)                           Z80.0, Family history of malignant neoplasm of                            digestive organs                           K57.30, Diverticulosis of large intestine without                            perforation or abscess without bleeding CPT copyright 2016 American Medical Association. All rights reserved. The codes documented in this report are preliminary and upon coder review may  be revised to meet current compliance requirements. Clarene Essex, MD 03/14/2017 2:01:10  PM This report has been signed electronically. Number of Addenda: 0

## 2017-03-14 NOTE — Transfer of Care (Signed)
Immediate Anesthesia Transfer of Care Note  Patient: [name], MD  Procedure(s) Performed: COLONOSCOPY WITH PROPOFOL (N/A )  Patient Location: PACU and Endoscopy Unit  Anesthesia Type:MAC  Level of Consciousness: awake and alert   Airway & Oxygen Therapy: Patient Spontanous Breathing and Patient connected to face mask oxygen  Post-op Assessment: Report given to RN and Post -op Vital signs reviewed and stable  Post vital signs: Reviewed and stable  Last Vitals:  Vitals:   03/14/17 1217 03/14/17 1357  BP: (!) 167/60 135/64  Pulse: 61 67  Resp: 20 18  Temp: 36.9 C (!) 36.4 C  SpO2: 98% 100%    Last Pain:  Vitals:   03/14/17 1357  TempSrc: Oral         Complications: No apparent anesthesia complications

## 2017-03-14 NOTE — Anesthesia Preprocedure Evaluation (Addendum)
Anesthesia Evaluation  Patient identified by MRN, date of birth, ID band Patient awake    Reviewed: Allergy & Precautions, NPO status , Patient's Chart, lab work & pertinent test results  History of Anesthesia Complications (+) PONV and history of anesthetic complications  Airway Mallampati: II  TM Distance: >3 FB Neck ROM: Full    Dental no notable dental hx. (+) Dental Advisory Given, Caps   Pulmonary neg pulmonary ROS,    Pulmonary exam normal        Cardiovascular hypertension, Normal cardiovascular exam     Neuro/Psych negative neurological ROS  negative psych ROS   GI/Hepatic Neg liver ROS, GERD  ,  Endo/Other  Morbid obesity  Renal/GU      Musculoskeletal   Abdominal   Peds  Hematology   Anesthesia Other Findings   Reproductive/Obstetrics                            Anesthesia Physical  Anesthesia Plan  ASA: III  Anesthesia Plan: MAC   Post-op Pain Management:    Induction:   PONV Risk Score and Plan: 2 and Ondansetron and Propofol infusion  Airway Management Planned: Natural Airway  Additional Equipment:   Intra-op Plan:   Post-operative Plan:   Informed Consent: I have reviewed the patients History and Physical, chart, labs and discussed the procedure including the risks, benefits and alternatives for the proposed anesthesia with the patient or authorized representative who has indicated his/her understanding and acceptance.   Dental advisory given  Plan Discussed with: CRNA and Anesthesiologist  Anesthesia Plan Comments:        Anesthesia Quick Evaluation

## 2017-03-15 ENCOUNTER — Encounter (HOSPITAL_COMMUNITY): Payer: Self-pay | Admitting: Gastroenterology

## 2017-03-28 ENCOUNTER — Other Ambulatory Visit: Payer: Self-pay

## 2017-03-28 MED ORDER — HYDROCHLOROTHIAZIDE 12.5 MG PO TABS: 12.5000 mg | ORAL_TABLET | Freq: Every evening | ORAL | 3 refills | Status: DC

## 2017-07-24 ENCOUNTER — Ambulatory Visit: Payer: 59 | Admitting: Internal Medicine

## 2017-07-24 DIAGNOSIS — Z0289 Encounter for other administrative examinations: Secondary | ICD-10-CM

## 2017-08-14 ENCOUNTER — Encounter: Payer: 59 | Admitting: Internal Medicine

## 2017-08-15 ENCOUNTER — Ambulatory Visit (INDEPENDENT_AMBULATORY_CARE_PROVIDER_SITE_OTHER): Payer: 59 | Admitting: Internal Medicine

## 2017-08-15 ENCOUNTER — Encounter: Payer: Self-pay | Admitting: Internal Medicine

## 2017-08-15 VITALS — BP 148/80 | HR 72 | Temp 98.8°F | Wt 328.6 lb

## 2017-08-15 DIAGNOSIS — Z0000 Encounter for general adult medical examination without abnormal findings: Secondary | ICD-10-CM | POA: Diagnosis not present

## 2017-08-15 MED ORDER — HYDROCHLOROTHIAZIDE 12.5 MG PO TABS: 12.5000 mg | ORAL_TABLET | Freq: Every evening | ORAL | 3 refills | Status: DC

## 2017-08-15 MED ORDER — ALLOPURINOL 100 MG PO TABS: 200.0000 mg | ORAL_TABLET | Freq: Every day | ORAL | 6 refills | Status: DC

## 2017-08-15 NOTE — Patient Instructions (Signed)
It is important that you exercise regularly, at least 20 minutes 3 to 4 times per week.  If you develop chest pain or shortness of breath seek  medical attention.  You need to lose weight.  Consider a lower calorie diet and regular exercise.  Return in one year for follow-up

## 2017-08-15 NOTE — Progress Notes (Signed)
Subjective:    Patient ID: Keith Oto, MD, male    DOB: 1955/02/18, 62 y.o.   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  He has had lumbar surgery as well as left hip arthroplasty.  He has had bilateral knee arthroscopic surgery. He has a history of colonic polyps and did have follow-up colonoscopy in February of this year.  He has been followed by urology with a history of renal stones and hyperuricemia.  He remains on allopurinol as well as hydrochlorothiazide for recurrent stone prevention. Doing well except for weight gain as well as orthopedic issues with back knee and hip pain. He has gained 12 pounds since his last visit 6 months ago.  Family history.  Father died at 50 history of metastatic colon cancer terminal he had bone marrow failure.  Mother died at 40 with cerebrovascular disease and renal failure.  Status post permanent pacemaker one brother died of complications of a stroke with morbid obesity He has 5 brothers and 2 sisters positive for OSA one sister with severe bipolar depression one sister died in a motor vehicle accident  Past Medical History:  Diagnosis Date  . Acute meniscal tear of left knee   . Allergy    fire ants  . Complication of anesthesia   . History of kidney stones   . Hypertension   . PONV (postoperative nausea and vomiting)      Social History   Socioeconomic History  . Marital status: Married    Spouse name: Not on file  . Number of children: Not on file  . Years of education: Not on file  . Highest education level: Not on file  Occupational History  . Not on file  Social Needs  . Financial resource strain: Not on file  . Food insecurity:    Worry: Not on file    Inability: Not on file  . Transportation needs:    Medical: Not on file    Non-medical: Not on file  Tobacco Use  . Smoking status: Never Smoker  . Smokeless tobacco:  Never Used  Substance and Sexual Activity  . Alcohol use: Yes    Alcohol/week: 4.2 oz    Types: 7 Glasses of wine per week    Comment: glass of wine per night  . Drug use: No  . Sexual activity: Not on file  Lifestyle  . Physical activity:    Days per week: Not on file    Minutes per session: Not on file  . Stress: Not on file  Relationships  . Social connections:    Talks on phone: Not on file    Gets together: Not on file    Attends religious service: Not on file    Active member of club or organization: Not on file    Attends meetings of clubs or organizations: Not on file    Relationship status: Not on file  . Intimate partner violence:    Fear of current or ex partner: Not on file    Emotionally abused: Not on file    Physically abused: Not on file    Forced sexual activity: Not on file  Other Topics Concern  . Not on file  Social History Narrative  . Not on file    Past Surgical History:  Procedure Laterality Date  . BACK SURGERY     laminectomy L5-S1  . COLONOSCOPY WITH PROPOFOL  N/A 03/14/2017   Procedure: COLONOSCOPY WITH PROPOFOL;  Surgeon: Clarene Essex, MD;  Location: [facility];  Service: Endoscopy;  Laterality: N/A;  . Colonscopy     polyps  . CYSTOSCOPY KIDNEY W/ URETERAL GUIDE WIRE  2010   lithotripsy  . CYSTOSCOPY/RETROGRADE/URETEROSCOPY/STONE EXTRACTION WITH BASKET Right 02/16/2016   Procedure: CYSTOSCOPY/RETROGRADE/RIGHT FLEXIBLE URETEROSCOPY/STONE EXTRACTION WITH BASKET;  Surgeon: Raynelle Bring, MD;  Location: [facility];  Service: Urology;  Laterality: Right;  . HOLMIUM LASER APPLICATION Left 7/32/2025   Procedure: HOLMIUM LASER APPLICATION;  Surgeon: Raynelle Bring, MD;  Location: [facility];  Service: Urology;  Laterality: Left;  . KNEE ARTHROSCOPY Right 2013  . KNEE ARTHROSCOPY WITH MEDIAL MENISECTOMY Left 03/12/2015   Procedure: LEFT KNEE ARTHROSCOPY WITH PARTIAL MEDIAL MENISCECTOMY;  Surgeon: Mcarthur Rossetti, MD;  Location: [facility];  Service:  Orthopedics;  Laterality: Left;  . LAMINECTOMY  1999  . MR HIPS BILATERAL     2009&2010  . TOTAL HIP REVISION  09/15/2011   Procedure: TOTAL HIP REVISION;  Surgeon: Mcarthur Rossetti, MD;  Location: [facility];  Service: Orthopedics;  Laterality: Right;  Revision right hip acetabular component    Family History  Problem Relation Age of Onset  . Colon cancer Father     No Known Allergies  Current Outpatient Medications on File Prior to Visit  Medication Sig Dispense Refill  . Loratadine (CLARITIN) 10 MG CAPS Take 10 mg by mouth at bedtime.      No current facility-administered medications on file prior to visit.     BP (!) 148/80 (BP Location: Right Arm, Patient Position: Sitting, Cuff Size: Large)   Pulse 72   Temp 98.8 F (37.1 C) (Oral)   Wt (!) 328 lb 9.6 oz (149.1 kg)   SpO2 97%   BMI 48.53 kg/m      Review of Systems  Constitutional: Positive for unexpected weight change. Negative for appetite change, chills, fatigue and fever.  HENT: Negative for congestion, dental problem, ear pain, hearing loss, sore throat, tinnitus, trouble swallowing and voice change.   Eyes: Negative for pain, discharge and visual disturbance.  Respiratory: Negative for cough, chest tightness, wheezing and stridor.   Cardiovascular: Negative for chest pain, palpitations and leg swelling.  Gastrointestinal: Negative for abdominal distention, abdominal pain, blood in stool, constipation, diarrhea, nausea and vomiting.  Genitourinary: Negative for difficulty urinating, discharge, flank pain, genital sores, hematuria and urgency.  Musculoskeletal: Positive for arthralgias and back pain. Negative for gait problem, joint swelling, myalgias and neck stiffness.  Skin: Negative for rash.  Neurological: Negative for dizziness, syncope, speech difficulty, weakness, numbness and headaches.  Hematological: Negative for adenopathy. Does not bruise/bleed easily.  Psychiatric/Behavioral: Negative for  behavioral problems and dysphoric mood. The patient is not nervous/anxious.        Objective:   Physical Exam  Constitutional: He appears well-developed and well-nourished. No distress.  Weight 328 Repeat blood pressure 120/70  HENT:  Head: Normocephalic and atraumatic.  Right Ear: External ear normal.  Left Ear: External ear normal.  Nose: Nose normal.  Mouth/Throat: Oropharynx is clear and moist.  Small benign-appearing papule 4 mm left pharyngeal arch which has been chronic  Eyes: Pupils are equal, round, and reactive to light. Conjunctivae and EOM are normal. No scleral icterus.  Neck: Normal range of motion. Neck supple. No JVD present. No thyromegaly present.  Cardiovascular: Regular rhythm, normal heart sounds and intact distal pulses. Exam reveals no gallop and no friction rub.  No murmur heard. Pulmonary/Chest: Effort normal  and breath sounds normal. He exhibits no tenderness.  Abdominal: Soft. Bowel sounds are normal. He exhibits no distension and no mass. There is no tenderness.  Genitourinary: Prostate normal and penis normal.  Musculoskeletal: Normal range of motion. He exhibits no edema or tenderness.  Lymphadenopathy:    He has no cervical adenopathy.  Neurological: He is alert. He has normal reflexes. No cranial nerve deficit. Coordination normal.  Decreased vibratory sensation right lateral foot  Skin: Skin is warm and dry. No rash noted.  Psychiatric: He has a normal mood and affect. His behavior is normal.          Assessment & Plan:   Renal stone disease with history of renal colic.  Analysis revealed mixed calcium oxalate and uric acid stones.  Continue allopurinol and hydrochlorothiazide.  Will check uric acid level History of colonic polyps.  Recent colonoscopy performed.  Continue colonoscopies at 5-year intervals Osteoarthritis.  Status post laminectomy status post left THA Impaired glucose tolerance.  Weight loss more rigorous exercise activities  discussed and encouraged Urology follow-up as scheduled  Return in 6 to 12 months  Marletta Lor

## 2017-08-16 LAB — HEPATITIS C ANTIBODY
Hepatitis C Ab: NONREACTIVE
SIGNAL TO CUT-OFF: 0.03 (ref <1.00)

## 2017-08-16 LAB — URIC ACID: URIC ACID, SERUM: 7.3 mg/dL (ref 4.0–7.8)

## 2017-08-16 LAB — HIGH SENSITIVITY CRP: CRP HIGH SENSITIVITY: 4.43 mg/L (ref 0.000–5.000)

## 2017-08-17 ENCOUNTER — Other Ambulatory Visit: Payer: Self-pay | Admitting: Internal Medicine

## 2017-08-17 MED ORDER — ALLOPURINOL 300 MG PO TABS: 300.0000 mg | ORAL_TABLET | Freq: Every day | ORAL | 6 refills | Status: DC

## 2017-10-02 ENCOUNTER — Telehealth: Payer: Self-pay | Admitting: Family Medicine

## 2017-10-02 NOTE — Telephone Encounter (Signed)
Please advise if transfer can be approved.   Copied from [place] [id number]. Topic: Appointment Scheduling - Scheduling Inquiry for Clinic >> Oct 02, 2017  9:24 AM Margot Ables wrote: Reason for CRM: pt is wanting to transfer care to Dr. Yong Channel (as he was referred by Dr. Burnice Logan). 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 Please call to advise pt of appt if accepted.

## 2017-10-02 NOTE — Telephone Encounter (Signed)
Absolutely willing to accept. You can put him in that appointment slot. Marcene Brawn- only question is- since I am going to be going to the 20 minute scheduling in January (talk with [name]) - do we want to get the schedule built first before we schedule him (because would end up needing to be 2 20 or 2 40 I believe with the new template.

## 2017-10-03 NOTE — Telephone Encounter (Signed)
Go ahead and schedule I will have to fix all of the appointments.

## 2017-10-03 NOTE — Telephone Encounter (Signed)
[name] please advise if I can go ahead and get the patient scheduled as Dr. Ansel Bong template may be changing?

## 2017-10-04 NOTE — Telephone Encounter (Signed)
I scheduled the patient and attempted to call to notify of the appointment however, the voicemail was full. Patient is active of mychart and the appointment will reflect in mychart as well.

## 2018-02-04 ENCOUNTER — Encounter: Payer: 59 | Admitting: Family Medicine

## 2018-03-20 ENCOUNTER — Ambulatory Visit (INDEPENDENT_AMBULATORY_CARE_PROVIDER_SITE_OTHER): Payer: No Typology Code available for payment source | Admitting: Family Medicine

## 2018-03-20 ENCOUNTER — Encounter (INDEPENDENT_AMBULATORY_CARE_PROVIDER_SITE_OTHER): Payer: Self-pay | Admitting: Family Medicine

## 2018-03-20 VITALS — BP 160/88 | HR 59 | Temp 98.4°F | Resp 16 | Ht 70.0 in | Wt 310.0 lb

## 2018-03-20 DIAGNOSIS — Z87442 Personal history of urinary calculi: Secondary | ICD-10-CM | POA: Diagnosis not present

## 2018-03-20 DIAGNOSIS — E790 Hyperuricemia without signs of inflammatory arthritis and tophaceous disease: Secondary | ICD-10-CM | POA: Diagnosis not present

## 2018-03-20 DIAGNOSIS — Z0000 Encounter for general adult medical examination without abnormal findings: Secondary | ICD-10-CM | POA: Diagnosis not present

## 2018-03-20 DIAGNOSIS — Z800 Family history of malignant neoplasm of digestive organs: Secondary | ICD-10-CM | POA: Diagnosis not present

## 2018-03-20 DIAGNOSIS — Z86010 Personal history of colonic polyps: Secondary | ICD-10-CM | POA: Diagnosis not present

## 2018-03-20 NOTE — Progress Notes (Signed)
Office Visit Note   Patient: Keith Oto, MD           Date of Birth: 11-Nov-1955           MRN: [id number] Visit Date: 03/20/2018 Requested by: Marletta Lor, MD [place], [place] [address] PCP: Eunice Blase, MD  Subjective: Chief Complaint  Patient presents with  . Annual Exam    HPI: He is here for a wellness examination.  No current complaints.  Previous PCP retired.  History of hyperuricemia with kidney stones.  Currently on allopurinol with last uric acid of 7.5 in January.  He has a history of colon polyps in the past.  Family history of colon cancer.  His last colonoscopy 1 year ago was normal and he is due for another one in 5 years.  History of obesity, currently working on losing weight with healthier eating.  Family history positive for metastatic colon cancer in his father, stroke and renal failure in his mother, pacemaker and a brother, sleep apnea and multiple siblings.              ROS: All other systems were reviewed and are negative.  Objective: Vital Signs: There were no vitals taken for this visit.  Physical Exam:  HEENT:  [place]/AT, PERRLA, EOM Full, no nystagmus.  Funduscopic examination within normal limits.  No conjunctival erythema.  Tympanic membranes are pearly gray with normal landmarks.  External ear canals are normal.  Nasal passages are clear.  Oropharynx is clear.  No significant lymphadenopathy.  No thyromegaly or nodules.  2+ carotid pulses without bruits. CV: Regular rate and rhythm without murmurs, rubs, or gallops.  No peripheral edema.  2+ radial and posterior tibial pulses. Lungs: Clear to auscultation throughout with no wheezing or areas of consolidation. Abd: Bowel sounds are active, no hepatosplenomegaly or masses.  Soft and nontender.  No audible bruits.  No evidence of ascites. EXT:  No nail deformities. Skin:  Some erythematous spots on face and ears.    Imaging: None  Assessment & Plan: 1.  Wellness  examination - Labs next summer. - Colonoscopy in 4 years.  2.  Hyperuricemia -On allopurinol.  3.  Obesity -Currently working on weight loss.     Procedures: No procedures performed  No notes on file     PMFS History: Patient Active Problem List   Diagnosis Date Noted  . Hyperuricemia 05/09/2016  . Family history of colon cancer 05/09/2016  . Left medial knee pain 03/12/2015  . Failed total hip arthroplasty ([place]) 09/15/2011  . Exogenous obesity 09/11/2011  . Impaired glucose tolerance 09/11/2011  . History of colonic polyps 09/11/2011  . NEPHROLITHIASIS, HX OF 08/20/2008  . Osteoarthrosis, unspecified whether generalized or localized, unspecified site 01/03/2007  . LUMBAR DISC DISORDER 01/03/2007   Past Medical History:  Diagnosis Date  . Acute meniscal tear of left knee   . Allergy    fire ants  . Complication of anesthesia   . History of kidney stones   . Hypertension   . PONV (postoperative nausea and vomiting)     Family History  Problem Relation Age of Onset  . Colon cancer Father     Past Surgical History:  Procedure Laterality Date  . BACK SURGERY     laminectomy L5-S1  . COLONOSCOPY WITH PROPOFOL N/A 03/14/2017   Procedure: COLONOSCOPY WITH PROPOFOL;  Surgeon: Clarene Essex, MD;  [facility];  Service: Endoscopy;  Laterality: N/A;  . Colonscopy  polyps  . CYSTOSCOPY KIDNEY W/ URETERAL GUIDE WIRE  2010   lithotripsy  . CYSTOSCOPY/RETROGRADE/URETEROSCOPY/STONE EXTRACTION WITH BASKET Right 02/16/2016   Procedure: CYSTOSCOPY/RETROGRADE/RIGHT FLEXIBLE URETEROSCOPY/STONE EXTRACTION WITH BASKET;  Surgeon: Raynelle Bring, MD;  [facility];  Service: Urology;  Laterality: Right;  . HOLMIUM LASER APPLICATION Left 5/97/4163   Procedure: HOLMIUM LASER APPLICATION;  Surgeon: Raynelle Bring, MD;  [facility];  Service: Urology;  Laterality: Left;  . KNEE ARTHROSCOPY Right 2013  . KNEE ARTHROSCOPY WITH MEDIAL MENISECTOMY Left 03/12/2015    Procedure: LEFT KNEE ARTHROSCOPY WITH PARTIAL MEDIAL MENISCECTOMY;  Surgeon: Mcarthur Rossetti, MD;  [facility];  Service: Orthopedics;  Laterality: Left;  . LAMINECTOMY  1999  . MR HIPS BILATERAL     2009&2010  . TOTAL HIP REVISION  09/15/2011   Procedure: TOTAL HIP REVISION;  Surgeon: Mcarthur Rossetti, MD;  Location: [facility];  Service: Orthopedics;  Laterality: Right;  Revision right hip acetabular component   Social History   Occupational History  . Not on file  Tobacco Use  . Smoking status: Never Smoker  . Smokeless tobacco: Never Used  Substance and Sexual Activity  . Alcohol use: Yes    Alcohol/week: 7.0 standard drinks    Types: 7 Glasses of wine per week    Comment: glass of wine per night  . Drug use: No  . Sexual activity: Not on file

## 2018-03-27 ENCOUNTER — Other Ambulatory Visit (INDEPENDENT_AMBULATORY_CARE_PROVIDER_SITE_OTHER): Payer: Self-pay | Admitting: Family Medicine

## 2018-03-27 MED ORDER — HYDROCHLOROTHIAZIDE 25 MG PO TABS: 25.0000 mg | ORAL_TABLET | Freq: Every day | ORAL | 3 refills | Status: DC

## 2018-05-02 ENCOUNTER — Encounter (INDEPENDENT_AMBULATORY_CARE_PROVIDER_SITE_OTHER): Payer: Self-pay | Admitting: Family Medicine

## 2018-05-02 DIAGNOSIS — Z0000 Encounter for general adult medical examination without abnormal findings: Secondary | ICD-10-CM

## 2018-05-02 DIAGNOSIS — E790 Hyperuricemia without signs of inflammatory arthritis and tophaceous disease: Secondary | ICD-10-CM

## 2018-05-02 DIAGNOSIS — Z87442 Personal history of urinary calculi: Secondary | ICD-10-CM

## 2018-05-20 ENCOUNTER — Other Ambulatory Visit: Payer: Self-pay | Admitting: Family Medicine

## 2018-05-20 MED ORDER — HYDROCHLOROTHIAZIDE 25 MG PO TABS: 25.0000 mg | ORAL_TABLET | Freq: Every day | ORAL | 3 refills | Status: DC

## 2018-06-05 ENCOUNTER — Other Ambulatory Visit: Payer: Self-pay | Admitting: Family Medicine

## 2018-06-05 ENCOUNTER — Other Ambulatory Visit: Payer: Self-pay

## 2018-06-05 DIAGNOSIS — E790 Hyperuricemia without signs of inflammatory arthritis and tophaceous disease: Secondary | ICD-10-CM

## 2018-06-05 DIAGNOSIS — Z0000 Encounter for general adult medical examination without abnormal findings: Secondary | ICD-10-CM

## 2018-06-05 DIAGNOSIS — Z87442 Personal history of urinary calculi: Secondary | ICD-10-CM

## 2018-06-06 ENCOUNTER — Telehealth: Payer: Self-pay | Admitting: Family Medicine

## 2018-06-06 ENCOUNTER — Encounter: Payer: Self-pay | Admitting: Family Medicine

## 2018-06-06 ENCOUNTER — Other Ambulatory Visit: Payer: Self-pay | Admitting: Family Medicine

## 2018-06-06 DIAGNOSIS — E559 Vitamin D deficiency, unspecified: Secondary | ICD-10-CM | POA: Insufficient documentation

## 2018-06-06 LAB — CBC WITH DIFFERENTIAL/PLATELET
Absolute Monocytes: 764 cells/uL (ref 200–950)
Basophils Absolute: 42 cells/uL (ref 0–200)
Basophils Relative: 0.5 %
Eosinophils Absolute: 191 cells/uL (ref 15–500)
Eosinophils Relative: 2.3 %
HCT: 49 % (ref 38.5–50.0)
Hemoglobin: 16.7 g/dL (ref 13.2–17.1)
Lymphs Abs: 2241 cells/uL (ref 850–3900)
MCH: 30.1 pg (ref 27.0–33.0)
MCHC: 34.1 g/dL (ref 32.0–36.0)
MCV: 88.3 fL (ref 80.0–100.0)
MPV: 12.2 fL (ref 7.5–12.5)
Monocytes Relative: 9.2 %
Neutro Abs: 5063 cells/uL (ref 1500–7800)
Neutrophils Relative %: 61 %
Platelets: 167 10*3/uL (ref 140–400)
RBC: 5.55 10*6/uL (ref 4.20–5.80)
RDW: 13 % (ref 11.0–15.0)
Total Lymphocyte: 27 %
WBC: 8.3 10*3/uL (ref 3.8–10.8)

## 2018-06-06 LAB — URIC ACID: Uric Acid, Serum: 5.5 mg/dL (ref 4.0–8.0)

## 2018-06-06 LAB — COMPREHENSIVE METABOLIC PANEL
AG Ratio: 1.6 (calc) (ref 1.0–2.5)
ALT: 28 U/L (ref 9–46)
AST: 25 U/L (ref 10–35)
Albumin: 4.5 g/dL (ref 3.6–5.1)
Alkaline phosphatase (APISO): 69 U/L (ref 35–144)
BUN: 15 mg/dL (ref 7–25)
CO2: 28 mmol/L (ref 20–32)
Calcium: 9.9 mg/dL (ref 8.6–10.3)
Chloride: 101 mmol/L (ref 98–110)
Creat: 0.99 mg/dL (ref 0.70–1.25)
Globulin: 2.9 g/dL (calc) (ref 1.9–3.7)
Glucose, Bld: 84 mg/dL (ref 65–99)
Potassium: 4.5 mmol/L (ref 3.5–5.3)
Sodium: 139 mmol/L (ref 135–146)
Total Bilirubin: 0.5 mg/dL (ref 0.2–1.2)
Total Protein: 7.4 g/dL (ref 6.1–8.1)

## 2018-06-06 LAB — THYROID PANEL WITH TSH
Free Thyroxine Index: 2.1 (ref 1.4–3.8)
T3 Uptake: 27 % (ref 22–35)
T4, Total: 7.9 ug/dL (ref 4.9–10.5)
TSH: 2.39 mIU/L (ref 0.40–4.50)

## 2018-06-06 LAB — HEMOGLOBIN A1C
Hgb A1c MFr Bld: 5.3 % of total Hgb (ref <5.7)
Mean Plasma Glucose: 105 (calc)
eAG (mmol/L): 5.8 (calc)

## 2018-06-06 LAB — LIPID PANEL
Cholesterol: 177 mg/dL (ref <200)
HDL: 46 mg/dL (ref >=40)
LDL Cholesterol (Calc): 104 mg/dL (calc) — ABNORMAL HIGH
Non-HDL Cholesterol (Calc): 131 mg/dL (calc) — ABNORMAL HIGH (ref <130)
Total CHOL/HDL Ratio: 3.8 (calc) (ref <5.0)
Triglycerides: 153 mg/dL — ABNORMAL HIGH (ref <150)

## 2018-06-06 LAB — PSA: PSA: 1.1 ng/mL (ref <=4.0)

## 2018-06-06 LAB — HEPATITIS C ANTIBODY
Hepatitis C Ab: NONREACTIVE
SIGNAL TO CUT-OFF: 0.03 (ref <1.00)

## 2018-06-06 LAB — VITAMIN D 25 HYDROXY (VIT D DEFICIENCY, FRACTURES): Vit D, 25-Hydroxy: 14 ng/mL — ABNORMAL LOW (ref 30–100)

## 2018-06-06 LAB — HIGH SENSITIVITY CRP: hs-CRP: 2.8 mg/L

## 2018-06-06 MED ORDER — VITAMIN K2 100 MCG PO CAPS: 100.0000 ug | ORAL_CAPSULE | Freq: Every day | ORAL | 3 refills | Status: DC

## 2018-06-06 MED ORDER — MAGNESIUM 400 MG PO CAPS: 400.0000 mg | ORAL_CAPSULE | Freq: Every day | ORAL | 3 refills | Status: DC

## 2018-06-06 MED ORDER — VITAMIN D-3 125 MCG (5000 UT) PO TABS: 1.0000 | ORAL_TABLET | Freq: Every day | ORAL | 3 refills | Status: DC

## 2018-06-06 MED ORDER — VITAMIN D3 250 MCG (10000 UT) PO CAPS: 10000.0000 [IU] | ORAL_CAPSULE | Freq: Every day | ORAL | 0 refills | Status: DC

## 2018-06-06 NOTE — Telephone Encounter (Signed)
Vitamin D is 14.  My preference is to have it in the 50-80 range.  Usually at this level, I use this protocol:  - Vitamin D3, 5,000 IU tablets, two daily for 3 months and then one daily long-term after that. - Vitamin K2:  100 mcg daily - Magnesium:  200-400 mg daily  Would recheck in about 6 months.  Borderline lipids will continue to improve with your lifestyle changes.  All else looks great.  Hep C still pending.

## 2018-06-06 NOTE — Telephone Encounter (Signed)
Hep C negative.

## 2018-09-04 ENCOUNTER — Other Ambulatory Visit: Payer: Self-pay | Admitting: Family Medicine

## 2018-09-04 MED ORDER — ALLOPURINOL 300 MG PO TABS: 300.0000 mg | ORAL_TABLET | Freq: Every day | ORAL | 6 refills | Status: DC

## 2018-09-20 ENCOUNTER — Other Ambulatory Visit: Payer: Self-pay | Admitting: Family Medicine

## 2018-09-20 DIAGNOSIS — R0989 Other specified symptoms and signs involving the circulatory and respiratory systems: Secondary | ICD-10-CM

## 2019-01-29 ENCOUNTER — Other Ambulatory Visit: Payer: Self-pay | Admitting: Family Medicine

## 2019-01-29 DIAGNOSIS — Z0000 Encounter for general adult medical examination without abnormal findings: Secondary | ICD-10-CM

## 2019-01-29 MED ORDER — TELMISARTAN 40 MG PO TABS: ORAL_TABLET | ORAL | 6 refills | Status: DC

## 2019-03-26 ENCOUNTER — Other Ambulatory Visit: Payer: Self-pay

## 2019-03-26 ENCOUNTER — Encounter: Payer: Self-pay | Admitting: Family Medicine

## 2019-03-26 ENCOUNTER — Ambulatory Visit (INDEPENDENT_AMBULATORY_CARE_PROVIDER_SITE_OTHER): Payer: No Typology Code available for payment source | Admitting: Family Medicine

## 2019-03-26 ENCOUNTER — Other Ambulatory Visit: Payer: Self-pay | Admitting: Family Medicine

## 2019-03-26 VITALS — BP 156/83 | HR 53 | Resp 16 | Ht 68.5 in | Wt 310.2 lb

## 2019-03-26 DIAGNOSIS — Z6841 Body Mass Index (BMI) 40.0 and over, adult: Secondary | ICD-10-CM | POA: Diagnosis not present

## 2019-03-26 DIAGNOSIS — E559 Vitamin D deficiency, unspecified: Secondary | ICD-10-CM | POA: Diagnosis not present

## 2019-03-26 DIAGNOSIS — Z0000 Encounter for general adult medical examination without abnormal findings: Secondary | ICD-10-CM | POA: Diagnosis not present

## 2019-03-26 MED ORDER — HYDROCHLOROTHIAZIDE 25 MG PO TABS: 25.0000 mg | ORAL_TABLET | Freq: Every day | ORAL | 3 refills | Status: DC

## 2019-03-26 NOTE — Progress Notes (Signed)
Office Visit Note   Patient: Keith Oto, MD           Date of Birth: July 21, 1955           MRN: [id number] Visit Date: 03/26/2019 Requested by: Eunice Blase, MD [address] [place],  [place] [address] PCP: Eunice Blase, MD  Subjective: Chief Complaint  Patient presents with  . Annual Exam    HPI: He is here for a wellness exam.  From a blood pressure standpoint he has not been taking his medicine on a regular basis.  No side effects with it, he just frequently forgets to take it.  He has vitamin D deficiency and takes it sporadically as well.  He starting to have some allergic conjunctivitis this past week.  Otherwise doing pretty well, no specific complaints.               ROS:   All other systems were reviewed and are negative.  Objective: Vital Signs: BP (!) 156/83   Pulse (!) 53   Resp 16   Ht 5' 8.5" (1.74 m)   Wt (!) 310 lb 3.2 oz (140.7 kg)   BMI 46.48 kg/m   Physical Exam:  General:  Alert and oriented, in no acute distress. Pulm:  Breathing unlabored. Psy:  Normal mood, congruent affect. Skin: No suspicious lesions. HEENT:  [place]/AT, PERRLA, EOM Full, no nystagmus.  Funduscopic examination within normal limits.  Mild right conjunctival erythema.  Tympanic membranes are pearly gray with normal landmarks.  External ear canals are normal.  No significant lymphadenopathy.  No thyromegaly or nodules.  2+ carotid pulses without bruits. CV: Regular rate and rhythm without murmurs, rubs, or gallops.  No peripheral edema.  2+ radial and posterior tibial pulses. Lungs: Clear to auscultation throughout with no wheezing or areas of consolidation. Abd: Protuberant.  Bowel sounds are active, no hepatosplenomegaly or masses.  Soft and nontender.  No audible bruits.  No evidence of ascites.   Imaging: None  Assessment & Plan: 1.  Wellness exam - "Doctors' Day" labs in near future.  2.  Vit D deficiency - Recheck today.  3.  Obesity - A1C today.  4.  Uric  acid kidney stones. - Stable on allopurinol.     Procedures: No procedures performed  No notes on file     PMFS History: Patient Active Problem List   Diagnosis Date Noted  . Vitamin D deficiency 06/06/2018  . Hyperuricemia 05/09/2016  . Family history of colon cancer 05/09/2016  . Left medial knee pain 03/12/2015  . Failed total hip arthroplasty ([place]) 09/15/2011  . Exogenous obesity 09/11/2011  . Impaired glucose tolerance 09/11/2011  . History of colonic polyps 09/11/2011  . NEPHROLITHIASIS, HX OF 08/20/2008  . Osteoarthrosis, unspecified whether generalized or localized, unspecified site 01/03/2007  . LUMBAR DISC DISORDER 01/03/2007   Past Medical History:  Diagnosis Date  . Acute meniscal tear of left knee   . Allergy    fire ants  . Complication of anesthesia   . History of kidney stones   . Hypertension   . PONV (postoperative nausea and vomiting)     Family History  Problem Relation Age of Onset  . Colon cancer Father   . Cancer Father   . Anemia Father   . Diabetes Father   . Stroke Mother   . Kidney failure Mother   . Heart attack Sister   . Obesity Sister   . Prostatitis Brother   . Heart failure Brother   .  Sleep apnea Brother   . Obesity Brother   . Obesity Brother   . Obesity Brother   . Obesity Brother   . Prostate cancer Neg Hx     Past Surgical History:  Procedure Laterality Date  . BACK SURGERY     laminectomy L5-S1  . COLONOSCOPY WITH PROPOFOL N/A 03/14/2017   Procedure: COLONOSCOPY WITH PROPOFOL;  Surgeon: Clarene Essex, MD;  [facility];  Service: Endoscopy;  Laterality: N/A;  . Colonscopy     polyps  . CYSTOSCOPY KIDNEY W/ URETERAL GUIDE WIRE  2010   lithotripsy  . CYSTOSCOPY/RETROGRADE/URETEROSCOPY/STONE EXTRACTION WITH BASKET Right 02/16/2016   Procedure: CYSTOSCOPY/RETROGRADE/RIGHT FLEXIBLE URETEROSCOPY/STONE EXTRACTION WITH BASKET;  Surgeon: Raynelle Bring, MD;  [facility];  Service: Urology;  Laterality: Right;    . HOLMIUM LASER APPLICATION Left [id number]   Procedure: HOLMIUM LASER APPLICATION;  Surgeon: Raynelle Bring, MD;  [facility];  Service: Urology;  Laterality: Left;  . KNEE ARTHROSCOPY Right 2013  . KNEE ARTHROSCOPY WITH MEDIAL MENISECTOMY Left 03/12/2015   Procedure: LEFT KNEE ARTHROSCOPY WITH PARTIAL MEDIAL MENISCECTOMY;  Surgeon: Mcarthur Rossetti, MD;  [facility];  Service: Orthopedics;  Laterality: Left;  . LAMINECTOMY  1999  . MR HIPS BILATERAL     2009&2010  . TOTAL HIP REVISION  09/15/2011   Procedure: TOTAL HIP REVISION;  Surgeon: Mcarthur Rossetti, MD;  Location: [facility];  Service: Orthopedics;  Laterality: Right;  Revision right hip acetabular component   Social History   Occupational History  . Not on file  Tobacco Use  . Smoking status: Never Smoker  . Smokeless tobacco: Never Used  Substance and Sexual Activity  . Alcohol use: Yes    Alcohol/week: 7.0 standard drinks    Types: 7 Glasses of wine per week    Comment: glass of wine per night  . Drug use: No  . Sexual activity: Not on file

## 2019-03-27 ENCOUNTER — Telehealth: Payer: Self-pay | Admitting: Family Medicine

## 2019-03-27 LAB — HEMOGLOBIN A1C
Hgb A1c MFr Bld: 5.5 % of total Hgb (ref <5.7)
Mean Plasma Glucose: 111 (calc)
eAG (mmol/L): 6.2 (calc)

## 2019-03-27 LAB — VITAMIN D 25 HYDROXY (VIT D DEFICIENCY, FRACTURES): Vit D, 25-Hydroxy: 40 ng/mL (ref 30–100)

## 2019-03-27 NOTE — Telephone Encounter (Signed)
Labs look good.

## 2020-01-02 ENCOUNTER — Other Ambulatory Visit: Payer: Self-pay | Admitting: Family Medicine

## 2020-01-02 ENCOUNTER — Other Ambulatory Visit: Payer: Self-pay

## 2020-01-02 DIAGNOSIS — R6882 Decreased libido: Secondary | ICD-10-CM

## 2020-01-05 ENCOUNTER — Telehealth: Payer: Self-pay | Admitting: Family Medicine

## 2020-01-05 LAB — TESTOSTERONE TOTAL,FREE,BIO, MALES
Albumin: 4.3 g/dL (ref 3.6–5.1)
Sex Hormone Binding: 37 nmol/L (ref 22–77)
Testosterone, Bioavailable: 122.8 ng/dL (ref >=110.0)
Testosterone, Free: 62.3 pg/mL (ref 46.0–224.0)
Testosterone: 502 ng/dL (ref 250–827)

## 2020-01-05 NOTE — Telephone Encounter (Signed)
Testosterone levels look good.

## 2020-01-07 ENCOUNTER — Other Ambulatory Visit: Payer: Self-pay | Admitting: Family Medicine

## 2020-01-07 ENCOUNTER — Encounter: Payer: Self-pay | Admitting: Family Medicine

## 2020-01-07 DIAGNOSIS — N400 Enlarged prostate without lower urinary tract symptoms: Secondary | ICD-10-CM | POA: Insufficient documentation

## 2020-01-07 MED ORDER — TADALAFIL 5 MG PO TABS: 5.0000 mg | ORAL_TABLET | Freq: Every day | ORAL | 11 refills | Status: DC

## 2020-01-19 ENCOUNTER — Other Ambulatory Visit: Payer: Self-pay | Admitting: Family Medicine

## 2020-01-19 DIAGNOSIS — R0683 Snoring: Secondary | ICD-10-CM

## 2020-01-19 NOTE — Progress Notes (Signed)
Having snoring and sleep issues.  Will order sleep study.

## 2020-02-09 ENCOUNTER — Other Ambulatory Visit: Payer: Self-pay | Admitting: Family Medicine

## 2020-02-09 MED ORDER — ALLOPURINOL 300 MG PO TABS: 300.0000 mg | ORAL_TABLET | Freq: Every day | ORAL | 6 refills | Status: DC

## 2020-02-09 MED ORDER — TELMISARTAN 40 MG PO TABS: ORAL_TABLET | ORAL | 6 refills | Status: DC

## 2020-03-31 ENCOUNTER — Encounter: Payer: Self-pay | Admitting: Family Medicine

## 2020-03-31 ENCOUNTER — Other Ambulatory Visit: Payer: Self-pay

## 2020-03-31 ENCOUNTER — Ambulatory Visit (INDEPENDENT_AMBULATORY_CARE_PROVIDER_SITE_OTHER): Payer: No Typology Code available for payment source | Admitting: Family Medicine

## 2020-03-31 VITALS — BP 164/84 | HR 60 | Ht 68.5 in | Wt 330.4 lb

## 2020-03-31 DIAGNOSIS — Z87442 Personal history of urinary calculi: Secondary | ICD-10-CM

## 2020-03-31 DIAGNOSIS — R109 Unspecified abdominal pain: Secondary | ICD-10-CM

## 2020-03-31 DIAGNOSIS — Z0000 Encounter for general adult medical examination without abnormal findings: Secondary | ICD-10-CM

## 2020-03-31 DIAGNOSIS — E559 Vitamin D deficiency, unspecified: Secondary | ICD-10-CM

## 2020-03-31 DIAGNOSIS — R5383 Other fatigue: Secondary | ICD-10-CM

## 2020-03-31 DIAGNOSIS — H6192 Disorder of left external ear, unspecified: Secondary | ICD-10-CM

## 2020-03-31 DIAGNOSIS — Z6841 Body Mass Index (BMI) 40.0 and over, adult: Secondary | ICD-10-CM

## 2020-03-31 DIAGNOSIS — E790 Hyperuricemia without signs of inflammatory arthritis and tophaceous disease: Secondary | ICD-10-CM

## 2020-03-31 NOTE — Progress Notes (Signed)
Office Visit Note   Patient: Keith Oto, Keith Brown           Date of Birth: 09-19-1955           MRN: [id number] Visit Date: 03/31/2020 Requested by: Eunice Blase, Keith Brown [address] [place],  [place] [address] PCP: Eunice Blase, Keith Brown  Subjective: Chief Complaint  Patient presents with  . Annual Exam    HPI: He is here for annual wellness exam.  Having some troubles with fatigue lately.  Blood pressure has been up and down.  He does not take hydrochlorothiazide every day due to surgeries and clinic days.  He had some right-sided flank pain recently.  History of nephrolithiasis.  Colonoscopy will be done in 2 years due to polyps.  Up-to-date on eye exams and dental exams.               ROS:   All other systems were reviewed and are negative.  Objective: Vital Signs: BP (!) 164/84   Pulse 60   Ht 5' 8.5" (1.74 m)   Wt (!) 330 lb 6.4 oz (149.9 kg)   BMI 49.51 kg/m   Physical Exam:  General:  Alert and oriented, in no acute distress. Pulm:  Breathing unlabored. Psy:  Normal mood, congruent affect. Skin:  Possible actinic keratosis left ear.  HEENT:  [place]/AT, PERRLA, EOM Full, no nystagmus.  Funduscopic examination within normal limits.  No conjunctival erythema.  Tympanic membranes are pearly gray with normal landmarks.  External ear canals are normal.  Nasal passages are clear.  Oropharynx is clear.  No significant lymphadenopathy.  No thyromegaly or nodules.  2+ carotid pulses without bruits. CV: Regular rate and rhythm without murmurs, rubs, or gallops.  No peripheral edema.  2+ radial and posterior tibial pulses. Lungs: Clear to auscultation throughout with no wheezing or areas of consolidation.    Imaging: No results found.  Assessment & Plan: 1.  Wellness exam -Doctor's day labs in the near future.  2.  Fatigue -Labs today to evaluate.  3.  Flank pain -Urine dipstick.  4.  BP elevated - Under stress recently.  Consider proceeding with sleep study.  5..  Skin lesion of left ear - Derm consult.     Procedures: No procedures performed        PMFS History: Patient Active Problem List   Diagnosis Date Noted  . BPH (benign prostatic hyperplasia) 01/07/2020  . Vitamin D deficiency 06/06/2018  . Hyperuricemia 05/09/2016  . Family history of colon cancer 05/09/2016  . Left medial knee pain 03/12/2015  . Failed total hip arthroplasty ([place]) 09/15/2011  . Exogenous obesity 09/11/2011  . Impaired glucose tolerance 09/11/2011  . History of colonic polyps 09/11/2011  . NEPHROLITHIASIS, HX OF 08/20/2008  . Osteoarthrosis, unspecified whether generalized or localized, unspecified site 01/03/2007  . LUMBAR DISC DISORDER 01/03/2007   Past Medical History:  Diagnosis Date  . Acute meniscal tear of left knee   . Allergy    fire ants  . Complication of anesthesia   . History of kidney stones   . Hypertension   . PONV (postoperative nausea and vomiting)     Family History  Problem Relation Age of Onset  . Colon cancer Father   . Cancer Father   . Anemia Father   . Diabetes Father   . Stroke Mother   . Kidney failure Mother   . Heart attack Sister   . Obesity Sister   . Prostatitis Brother   .  Heart failure Brother   . Sleep apnea Brother   . Obesity Brother   . Obesity Brother   . Obesity Brother   . Obesity Brother   . Prostate cancer Neg Hx     Past Surgical History:  Procedure Laterality Date  . BACK SURGERY     laminectomy L5-S1  . COLONOSCOPY WITH PROPOFOL N/A 03/14/2017   Procedure: COLONOSCOPY WITH PROPOFOL;  Surgeon: Clarene Essex, Keith Brown;  Location: [facility];  Service: Endoscopy;  Laterality: N/A;  . Colonscopy     polyps  . CYSTOSCOPY KIDNEY W/ URETERAL GUIDE WIRE  2010   lithotripsy  . CYSTOSCOPY/RETROGRADE/URETEROSCOPY/STONE EXTRACTION WITH BASKET Right 02/16/2016   Procedure: CYSTOSCOPY/RETROGRADE/RIGHT FLEXIBLE URETEROSCOPY/STONE EXTRACTION WITH BASKET;  Surgeon: Raynelle Bring, Keith Brown;  Location: [facility];  Service:  Urology;  Laterality: Right;  . HOLMIUM LASER APPLICATION Left 6/94/8546   Procedure: HOLMIUM LASER APPLICATION;  Surgeon: Raynelle Bring, Keith Brown;  Location: [facility];  Service: Urology;  Laterality: Left;  . KNEE ARTHROSCOPY Right 2013  . KNEE ARTHROSCOPY WITH MEDIAL MENISECTOMY Left 03/12/2015   Procedure: LEFT KNEE ARTHROSCOPY WITH PARTIAL MEDIAL MENISCECTOMY;  Surgeon: Mcarthur Rossetti, Keith Brown;  Location: [facility];  Service: Orthopedics;  Laterality: Left;  . LAMINECTOMY  1999  . MR HIPS BILATERAL     2009&2010  . TOTAL HIP REVISION  09/15/2011   Procedure: TOTAL HIP REVISION;  Surgeon: Mcarthur Rossetti, Keith Brown;  Location: [facility];  Service: Orthopedics;  Laterality: Right;  Revision right hip acetabular component   Social History   Occupational History  . Not on file  Tobacco Use  . Smoking status: Never Smoker  . Smokeless tobacco: Never Used  Vaping Use  . Vaping Use: Never used  Substance and Sexual Activity  . Alcohol use: Yes    Alcohol/week: 7.0 standard drinks    Types: 7 Glasses of wine per week    Comment: glass of wine per night  . Drug use: No  . Sexual activity: Not on file

## 2020-04-01 ENCOUNTER — Telehealth: Payer: Self-pay | Admitting: Family Medicine

## 2020-04-01 LAB — THYROID PANEL WITH TSH
Free Thyroxine Index: 2.1 (ref 1.4–3.8)
T3 Uptake: 27 % (ref 22–35)
T4, Total: 7.9 ug/dL (ref 4.9–10.5)
TSH: 3.1 mIU/L (ref 0.40–4.50)

## 2020-04-01 LAB — CBC WITH DIFFERENTIAL/PLATELET
Absolute Monocytes: 863 cells/uL (ref 200–950)
Basophils Absolute: 36 cells/uL (ref 0–200)
Basophils Relative: 0.4 %
Eosinophils Absolute: 223 cells/uL (ref 15–500)
Eosinophils Relative: 2.5 %
HCT: 49.2 % (ref 38.5–50.0)
Hemoglobin: 16.3 g/dL (ref 13.2–17.1)
Lymphs Abs: 2652 cells/uL (ref 850–3900)
MCH: 29.4 pg (ref 27.0–33.0)
MCHC: 33.1 g/dL (ref 32.0–36.0)
MCV: 88.6 fL (ref 80.0–100.0)
MPV: 12.9 fL — ABNORMAL HIGH (ref 7.5–12.5)
Monocytes Relative: 9.7 %
Neutro Abs: 5126 cells/uL (ref 1500–7800)
Neutrophils Relative %: 57.6 %
Platelets: 160 10*3/uL (ref 140–400)
RBC: 5.55 10*6/uL (ref 4.20–5.80)
RDW: 13.1 % (ref 11.0–15.0)
Total Lymphocyte: 29.8 %
WBC: 8.9 10*3/uL (ref 3.8–10.8)

## 2020-04-01 LAB — IRON,TIBC AND FERRITIN PANEL
%SAT: 35 % (calc) (ref 20–48)
Ferritin: 451 ng/mL — ABNORMAL HIGH (ref 24–380)
Iron: 108 ug/dL (ref 50–180)
TIBC: 307 mcg/dL (calc) (ref 250–425)

## 2020-04-01 LAB — VITAMIN D 25 HYDROXY (VIT D DEFICIENCY, FRACTURES): Vit D, 25-Hydroxy: 59 ng/mL (ref 30–100)

## 2020-04-01 NOTE — Telephone Encounter (Signed)
Ferritin is elevated.  All else looks good.

## 2020-04-15 ENCOUNTER — Other Ambulatory Visit (HOSPITAL_COMMUNITY): Payer: Self-pay | Admitting: Emergency Medicine

## 2020-04-21 ENCOUNTER — Other Ambulatory Visit (HOSPITAL_COMMUNITY): Payer: Self-pay

## 2020-04-21 MED FILL — Telmisartan Tab 40 MG: ORAL | 30 days supply | Qty: 30 | Fill #0 | Status: AC

## 2020-04-23 ENCOUNTER — Other Ambulatory Visit: Payer: Self-pay

## 2020-04-23 ENCOUNTER — Ambulatory Visit (HOSPITAL_COMMUNITY)
Admission: RE | Admit: 2020-04-23 | Discharge: 2020-04-23 | Disposition: A | Discharge status: Home / Self Care | Payer: Self-pay | Source: Ambulatory Visit | Attending: Cardiology | Admitting: Cardiology

## 2020-05-24 ENCOUNTER — Other Ambulatory Visit (HOSPITAL_COMMUNITY): Payer: Self-pay

## 2020-05-24 MED FILL — Allopurinol Tab 300 MG: ORAL | 90 days supply | Qty: 90 | Fill #0 | Status: AC

## 2020-06-18 MED FILL — Telmisartan Tab 40 MG: ORAL | 30 days supply | Qty: 30 | Fill #1 | Status: AC

## 2020-06-21 ENCOUNTER — Other Ambulatory Visit (HOSPITAL_COMMUNITY): Payer: Self-pay

## 2020-10-01 ENCOUNTER — Other Ambulatory Visit (HOSPITAL_COMMUNITY): Payer: Self-pay

## 2020-10-01 MED FILL — Allopurinol Tab 300 MG: ORAL | 90 days supply | Qty: 90 | Fill #1 | Status: AC

## 2020-10-01 MED FILL — Telmisartan Tab 40 MG: ORAL | 30 days supply | Qty: 30 | Fill #2 | Status: AC

## 2021-01-03 ENCOUNTER — Other Ambulatory Visit: Payer: Self-pay

## 2021-01-03 ENCOUNTER — Other Ambulatory Visit (HOSPITAL_COMMUNITY): Payer: Self-pay

## 2021-01-03 ENCOUNTER — Ambulatory Visit (INDEPENDENT_AMBULATORY_CARE_PROVIDER_SITE_OTHER): Payer: No Typology Code available for payment source | Admitting: Family Medicine

## 2021-01-03 ENCOUNTER — Encounter: Payer: Self-pay | Admitting: Family Medicine

## 2021-01-03 VITALS — BP 171/98 | HR 60 | Temp 98.1°F | Ht 68.5 in | Wt 326.4 lb

## 2021-01-03 DIAGNOSIS — R739 Hyperglycemia, unspecified: Secondary | ICD-10-CM

## 2021-01-03 DIAGNOSIS — E785 Hyperlipidemia, unspecified: Secondary | ICD-10-CM

## 2021-01-03 DIAGNOSIS — E559 Vitamin D deficiency, unspecified: Secondary | ICD-10-CM | POA: Diagnosis not present

## 2021-01-03 DIAGNOSIS — Z125 Encounter for screening for malignant neoplasm of prostate: Secondary | ICD-10-CM | POA: Diagnosis not present

## 2021-01-03 DIAGNOSIS — Z0001 Encounter for general adult medical examination with abnormal findings: Secondary | ICD-10-CM | POA: Diagnosis not present

## 2021-01-03 DIAGNOSIS — Z23 Encounter for immunization: Secondary | ICD-10-CM | POA: Diagnosis not present

## 2021-01-03 DIAGNOSIS — I10 Essential (primary) hypertension: Secondary | ICD-10-CM | POA: Diagnosis not present

## 2021-01-03 DIAGNOSIS — Z87442 Personal history of urinary calculi: Secondary | ICD-10-CM

## 2021-01-03 DIAGNOSIS — E790 Hyperuricemia without signs of inflammatory arthritis and tophaceous disease: Secondary | ICD-10-CM

## 2021-01-03 LAB — CBC
HCT: 48 % (ref 39.0–52.0)
Hemoglobin: 16.1 g/dL (ref 13.0–17.0)
MCHC: 33.6 g/dL (ref 30.0–36.0)
MCV: 89.7 fl (ref 78.0–100.0)
Platelets: 145 10*3/uL — ABNORMAL LOW (ref 150.0–400.0)
RBC: 5.36 Mil/uL (ref 4.22–5.81)
RDW: 14.2 % (ref 11.5–15.5)
WBC: 8.4 10*3/uL (ref 4.0–10.5)

## 2021-01-03 LAB — COMPREHENSIVE METABOLIC PANEL
ALT: 42 U/L (ref 0–53)
AST: 34 U/L (ref 0–37)
Albumin: 4.3 g/dL (ref 3.5–5.2)
Alkaline Phosphatase: 62 U/L (ref 39–117)
BUN: 20 mg/dL (ref 6–23)
CO2: 28 mEq/L (ref 19–32)
Calcium: 9.7 mg/dL (ref 8.4–10.5)
Chloride: 102 mEq/L (ref 96–112)
Creatinine, Ser: 1.01 mg/dL (ref 0.40–1.50)
GFR: 78.14 mL/min (ref >60.00)
Glucose, Bld: 90 mg/dL (ref 70–99)
Potassium: 4.4 mEq/L (ref 3.5–5.1)
Sodium: 138 mEq/L (ref 135–145)
Total Bilirubin: 0.9 mg/dL (ref 0.2–1.2)
Total Protein: 7.4 g/dL (ref 6.0–8.3)

## 2021-01-03 LAB — LIPID PANEL
Cholesterol: 196 mg/dL (ref 0–200)
HDL: 58.2 mg/dL (ref >39.00)
LDL Cholesterol: 119 mg/dL — ABNORMAL HIGH (ref 0–99)
NonHDL: 137.9
Total CHOL/HDL Ratio: 3
Triglycerides: 93 mg/dL (ref 0.0–149.0)
VLDL: 18.6 mg/dL (ref 0.0–40.0)

## 2021-01-03 LAB — HEMOGLOBIN A1C: Hgb A1c MFr Bld: 5.5 % (ref 4.6–6.5)

## 2021-01-03 LAB — PSA: PSA: 1.46 ng/mL (ref 0.10–4.00)

## 2021-01-03 LAB — CBC AND DIFFERENTIAL: WBC: 8.4

## 2021-01-03 LAB — URIC ACID: Uric Acid, Serum: 6.9 mg/dL (ref 4.0–7.8)

## 2021-01-03 MED ORDER — TRIAMCINOLONE ACETONIDE 0.1 % EX CREA
TOPICAL_CREAM | Freq: Every day | CUTANEOUS | 5 refills | Status: AC
  Filled 2021-01-03: qty 45, 22d supply, fill #0

## 2021-01-03 MED ORDER — LOSARTAN POTASSIUM 100 MG PO TABS
100.0000 mg | ORAL_TABLET | Freq: Every day | ORAL | 3 refills | Status: DC
Start: 2021-01-03 — End: 2021-05-31
  Filled 2021-01-03: qty 90, 90d supply, fill #0

## 2021-01-03 NOTE — Assessment & Plan Note (Signed)
Continue allopurinol 300 mg daily.  Check uric acid level today.  We started losartan as above which should help as well.

## 2021-01-03 NOTE — Assessment & Plan Note (Signed)
Elevated today.  We will stop telmisartan and start losartan 100 mg daily.  Hopefully this will help some with his uric acid control as well.  We will continue HCTZ 25 mg daily.  He will continue monitoring and let me know if persistently elevated.  Check labs today.

## 2021-01-03 NOTE — Assessment & Plan Note (Signed)
Continue HCTZ 25 mg daily and allopurinol.  No recent kidney stones.

## 2021-01-03 NOTE — Progress Notes (Signed)
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  HE is establishing care at this office.   Assessment/Plan:  New/Acute Problems: Rash Dermatitis versus tinea.  Start triamcinolone-ketoconazole.  He will let me know if not improving.  Chronic Problems Addressed Today: Dyslipidemia Check labs.  Essential hypertension Elevated today.  We will stop telmisartan and start losartan 100 mg daily.  Hopefully this will help some with his uric acid control as well.  We will continue HCTZ 25 mg daily.  He will continue monitoring and let me know if persistently elevated.  Check labs today.  Vitamin D deficiency Check vitamin D.  Hyperuricemia Continue allopurinol 300 mg daily.  Check uric acid level today.  We started losartan as above which should help as well.    NEPHROLITHIASIS, HX OF Continue HCTZ 25 mg daily and allopurinol.  No recent kidney stones.   Body mass index is 48.91 kg/m. / Obese    Preventative Healthcare: Prevnar 20 and shingles vaccine given today.  We will check labs.  Up-to-date on colon cancer screening.  Patient Counseling(The following topics were reviewed and/or handout was given):  -Nutrition: Stressed importance of moderation in sodium/caffeine intake, saturated fat and cholesterol, caloric balance, sufficient intake of fresh fruits, vegetables, and fiber.  -Stressed the importance of regular exercise.   -Substance Abuse: Discussed cessation/primary prevention of tobacco, alcohol, or other drug use; driving or other dangerous activities under the influence; availability of treatment for abuse.   -Injury prevention: Discussed safety belts, safety helmets, smoke detector, smoking near bedding or upholstery.   -Sexuality: Discussed sexually transmitted diseases, partner selection, use of condoms, avoidance of unintended pregnancy and contraceptive alternatives.   -Dental health: Discussed  importance of regular tooth brushing, flossing, and dental visits.  -Health maintenance and immunizations reviewed. Please refer to Health maintenance section.  Return to care in 1 year for next preventative visit.     Subjective:  HPI:  He has no acute complaints today.   See A/P for status of chronic conditions.  Blood pressure has been running high for the last several months.  He has been compliant with his medications.  He is also noticed a rash in bilateral inguinal crease.  This has been going on for several years.  Typically improves with over-the-counter treatments.  Some itching.  Some dryness and flaking noted as well.  Lifestyle Diet: Balanced.  Exercise: Limited  Depression screen [facility] 2/9 01/03/2021  Decreased Interest 0  Down, Depressed, Hopeless 0  PHQ - 2 Score 0    Health Maintenance Due  Topic Date Due   HIV Screening  Never done   TETANUS/TDAP  Never done   Zoster Vaccines- Shingrix (1 of 2) Never done   Pneumonia Vaccine 55+ Years old (1 - PCV) Never done     ROS: Per HPI, otherwise a complete review of systems was negative.   PMH:  The following were reviewed and entered/updated in epic: Past Medical History:  Diagnosis Date   Acute meniscal tear of left knee    Allergy    fire ants   Complication of anesthesia    History of kidney stones    Hypertension    PONV (postoperative nausea and vomiting)    Patient Active Problem List   Diagnosis Date Noted   Essential hypertension 01/03/2021   Dyslipidemia 01/03/2021   BPH (benign prostatic hyperplasia) 01/07/2020   Vitamin D deficiency 06/06/2018   Hyperuricemia  05/09/2016   Family history of colon cancer 05/09/2016   Left medial knee pain 03/12/2015   History of colonic polyps 09/11/2011   NEPHROLITHIASIS, HX OF 08/20/2008   Osteoarthrosis, unspecified whether generalized or localized, unspecified site 01/03/2007   LUMBAR [place] DISORDER 01/03/2007   Past Surgical History:  Procedure  Laterality Date   BACK SURGERY     laminectomy L5-S1   COLONOSCOPY WITH PROPOFOL N/A 03/14/2017   Procedure: COLONOSCOPY WITH PROPOFOL;  Surgeon: Clarene Essex, MD;  [facility];  Service: Endoscopy;  Laterality: N/A;   Colonscopy     polyps   CYSTOSCOPY KIDNEY W/ URETERAL GUIDE WIRE  2010   lithotripsy   CYSTOSCOPY/RETROGRADE/URETEROSCOPY/STONE EXTRACTION WITH BASKET Right 02/16/2016   Procedure: CYSTOSCOPY/RETROGRADE/RIGHT FLEXIBLE URETEROSCOPY/STONE EXTRACTION WITH BASKET;  Surgeon: Raynelle Bring, MD;  [facility];  Service: Urology;  Laterality: Right;   HOLMIUM LASER APPLICATION Left 0/96/2836   Procedure: HOLMIUM LASER APPLICATION;  Surgeon: Raynelle Bring, MD;  [facility];  Service: Urology;  Laterality: Left;   KNEE ARTHROSCOPY Right 2013   KNEE ARTHROSCOPY WITH MEDIAL MENISECTOMY Left 03/12/2015   Procedure: LEFT KNEE ARTHROSCOPY WITH PARTIAL MEDIAL MENISCECTOMY;  Surgeon: Mcarthur Rossetti, MD;  [facility];  Service: Orthopedics;  Laterality: Left;   LAMINECTOMY  1999   MR HIPS BILATERAL     2009&2010   TOTAL HIP REVISION  09/15/2011   Procedure: TOTAL HIP REVISION;  Surgeon: Mcarthur Rossetti, MD;  Location: [facility];  Service: Orthopedics;  Laterality: Right;  Revision right hip acetabular component    Family History  Problem Relation Age of Onset   Colon cancer Father    Cancer Father    Anemia Father    Diabetes Father    Stroke Mother    Kidney failure Mother    Heart attack Sister    Obesity Sister    Prostatitis Brother    Heart failure Brother    Sleep apnea Brother    Obesity Brother    Obesity Brother    Obesity Brother    Obesity Brother    Prostate cancer Neg Hx     Medications- reviewed and updated Current Outpatient Medications  Medication Sig Dispense Refill   allopurinol (ZYLOPRIM) 300 MG tablet TAKE 1 TABLET (300 MG TOTAL) BY MOUTH DAILY. 90 tablet 6   Cholecalciferol (VITAMIN D3) 50 MCG (2000 UT) TABS Take by mouth  daily.     ketoconazole 2%-triamcinolone 0.1% 1:2 cream mixture Apply topically daily. Apply twice daily as needed. 45 g 5A   Loratadine 10 MG CAPS Take 10 mg by mouth at bedtime.      losartan (COZAAR) 100 MG tablet Take 1 tablet (100 mg total) by mouth daily. 90 tablet 3   tadalafil (CIALIS) 5 MG tablet Take 1 tablet (5 mg total) by mouth daily. 30 tablet 11   hydrochlorothiazide (HYDRODIURIL) 25 MG tablet TAKE 1 TABLET (25 MG TOTAL) BY MOUTH DAILY. 90 tablet 3   No current facility-administered medications for this visit.    Allergies-reviewed and updated No Known Allergies  Social History   Socioeconomic History   Marital status: Widowed    Spouse name: Not on file   Number of children: Not on file   Years of education: Not on file   Highest education level: Not on file  Occupational History   Not on file  Tobacco Use   Smoking status: Never   Smokeless tobacco: Never  Vaping Use   Vaping Use: Never used  Substance and Sexual Activity   Alcohol use: Yes    Alcohol/week: 7.0 standard drinks    Types: 7 Glasses of wine per week    Comment: glass of wine per night   Drug use: No   Sexual activity: Not on file  Other Topics Concern   Not on file  Social History Narrative   Not on file   Social Determinants of Health   Financial Resource Strain: Not on file  Food Insecurity: Not on file  Transportation Needs: Not on file  Physical Activity: Not on file  Stress: Not on file  Social Connections: Not on file        Objective:  Physical Exam: BP (!) 171/98    Pulse 60    Temp 98.1 F (36.7 C) (Temporal)    Ht 5' 8.5" (1.74 m)    Wt (!) 326 lb 6.4 oz (148.1 kg)    SpO2 95%    BMI 48.91 kg/m   Body mass index is 48.91 kg/m. Wt Readings from Last 3 Encounters:  01/03/21 (!) 326 lb 6.4 oz (148.1 kg)  03/31/20 (!) 330 lb 6.4 oz (149.9 kg)  03/26/19 (!) 310 lb 3.2 oz (140.7 kg)   Gen: NAD, resting comfortably HEENT: TMs normal bilaterally. OP clear. No thyromegaly  noted.  CV: RRR with no murmurs appreciated Pulm: NWOB, CTAB with no crackles, wheezes, or rhonchi GI: Normal bowel sounds present. Soft, Nontender, Nondistended. MSK: no edema, cyanosis, or clubbing noted Skin: warm, dry.  Excoriated erythematous rash in the inguinal creases bilaterally. Neuro: CN2-12 grossly intact. Strength 5/5 in upper and lower extremities. Reflexes symmetric and intact bilaterally.  Psych: Normal affect and thought content     Camillia Marcy M. Jerline Pain, MD 01/03/2021 10:16 AM

## 2021-01-03 NOTE — Patient Instructions (Signed)
It was very nice to see you today!  We will give you your shingles and pneumonia vaccine today.  Please stop the telmisartan and start the losartan.  Keep an eye on your blood pressure and let me know if it is persistently 150/90 or higher.  We will check blood work today.  I will send in a cream for your rash.  Please let me know if this is not improving.  I will refer you to see the weight management specialist.  Please come back in 1 year for your next physical.  Come back to see me sooner if needed.  Take care, Dr Jerline Pain  PLEASE NOTE:  If you had any lab tests please let us know if you have not heard back within a few days. You may see your results on mychart before we have a chance to review them but we will give you a call once they are reviewed by [place]. If we ordered any referrals today, please let us know if you have not heard from their office within the next week.   Please try these tips to maintain a healthy lifestyle:  Eat at least 3 REAL meals and 1-2 snacks per day.  Aim for no more than 5 hours between eating.  If you eat breakfast, please do so within one hour of getting up.   Each meal should contain half fruits/vegetables, one quarter protein, and one quarter carbs (no bigger than a computer mouse)  Cut down on sweet beverages. This includes juice, soda, and sweet tea.   Drink at least 1 glass of water with each meal and aim for at least 8 glasses per day  Exercise at least 150 minutes every week.    Preventive Care 79-19 Years Old, Male Preventive care refers to lifestyle choices and visits with your health care provider that can promote health and wellness. Preventive care visits are also called wellness exams. What can I expect for my preventive care visit? Counseling During your preventive care visit, your health care provider may ask about your: Medical history, including: Past medical problems. Family medical history. Current health,  including: Emotional well-being. Home life and relationship well-being. Sexual activity. Lifestyle, including: Alcohol, nicotine or tobacco, and drug use. Access to firearms. Diet, exercise, and sleep habits. Safety issues such as seatbelt and bike helmet use. Sunscreen use. Work and work Statistician. Physical exam Your health care provider will check your: Height and weight. These may be used to calculate your BMI (body mass index). BMI is a measurement that tells if you are at a healthy weight. Waist circumference. This measures the distance around your waistline. This measurement also tells if you are at a healthy weight and may help predict your risk of certain diseases, such as type 2 diabetes and high blood pressure. Heart rate and blood pressure. Body temperature. Skin for abnormal spots. What immunizations do I need? Vaccines are usually given at various ages, according to a schedule. Your health care provider will recommend vaccines for you based on your age, medical history, and lifestyle or other factors, such as travel or where you work. What tests do I need? Screening Your health care provider may recommend screening tests for certain conditions. This may include: Lipid and cholesterol levels. Diabetes screening. This is done by checking your blood sugar (glucose) after you have not eaten for a while (fasting). Hepatitis B test. Hepatitis C test. HIV (human immunodeficiency virus) test. STI (sexually transmitted infection) testing, if you are at risk.  Lung cancer screening. Prostate cancer screening. Colorectal cancer screening. Talk with your health care provider about your test results, treatment options, and if necessary, the need for more tests. Follow these instructions at home: Eating and drinking  Eat a diet that includes fresh fruits and vegetables, whole grains, lean protein, and low-fat dairy products. Take vitamin and mineral supplements as recommended by  your health care provider. Do not drink alcohol if your health care provider tells you not to drink. If you drink alcohol: Limit how much you have to 0-2 drinks a day. Know how much alcohol is in your drink. In the [place] one drink equals one 12 oz bottle of beer (355 mL), one 5 oz glass of wine (148 mL), or one 1 oz glass of hard liquor (44 mL). Lifestyle Brush your teeth every morning and night with fluoride toothpaste. Floss one time each day. Exercise for at least 30 minutes 5 or more days each week. Do not use any products that contain nicotine or tobacco. These products include cigarettes, chewing tobacco, and vaping devices, such as e-cigarettes. If you need help quitting, ask your health care provider. Do not use drugs. If you are sexually active, practice safe sex. Use a condom or other form of protection to prevent STIs. Take aspirin only as told by your health care provider. Make sure that you understand how much to take and what form to take. Work with your health care provider to find out whether it is safe and beneficial for you to take aspirin daily. Find healthy ways to manage stress, such as: Meditation, yoga, or listening to music. Journaling. Talking to a trusted person. Spending time with friends and family. Minimize exposure to UV radiation to reduce your risk of skin cancer. Safety Always wear your seat belt while driving or riding in a vehicle. Do not drive: If you have been drinking alcohol. Do not ride with someone who has been drinking. When you are tired or distracted. While texting. If you have been using any mind-altering substances or drugs. Wear a helmet and other protective equipment during sports activities. If you have firearms in your house, make sure you follow all gun safety procedures. What's next? Go to your health care provider once a year for an annual wellness visit. Ask your health care provider how often you should have your eyes and teeth  checked. Stay up to date on all vaccines. This information is not intended to replace advice given to you by your health care provider. Make sure you discuss any questions you have with your health care provider. Document Revised: 06/30/2020 Document Reviewed: 06/30/2020 Elsevier Patient Education  [place].

## 2021-01-03 NOTE — Assessment & Plan Note (Signed)
Check vitamin D. 

## 2021-01-03 NOTE — Assessment & Plan Note (Signed)
Check labs 

## 2021-01-03 NOTE — Addendum Note (Signed)
Addended by: Marian Sorrow on: 01/03/2021 10:29 AM   Modules accepted: Orders

## 2021-01-04 LAB — TSH: TSH: 2.63 u[IU]/mL (ref 0.35–5.50)

## 2021-01-04 LAB — VITAMIN D 25 HYDROXY (VIT D DEFICIENCY, FRACTURES): VITD: 61.59 ng/mL (ref 30.00–100.00)

## 2021-01-05 NOTE — Progress Notes (Signed)
Please inform patient of the following:  His "bad" cholesterol is a little bit elevated but everything else is stable. Do not need to make any changes to his treatment plan at this time. HE should keep working on diet and exercise and we can recheck in a year.  Algis Greenhouse. Jerline Pain, MD 01/05/2021 7:54 AM

## 2021-01-11 ENCOUNTER — Encounter: Payer: No Typology Code available for payment source | Admitting: Family Medicine

## 2021-01-13 DIAGNOSIS — Z0289 Encounter for other administrative examinations: Secondary | ICD-10-CM

## 2021-01-14 ENCOUNTER — Encounter: Payer: No Typology Code available for payment source | Admitting: Family Medicine

## 2021-02-02 ENCOUNTER — Encounter (INDEPENDENT_AMBULATORY_CARE_PROVIDER_SITE_OTHER): Payer: Self-pay | Admitting: Family Medicine

## 2021-02-02 ENCOUNTER — Other Ambulatory Visit: Payer: Self-pay

## 2021-02-02 ENCOUNTER — Ambulatory Visit (INDEPENDENT_AMBULATORY_CARE_PROVIDER_SITE_OTHER): Payer: No Typology Code available for payment source | Admitting: Family Medicine

## 2021-02-02 VITALS — BP 153/74 | HR 65 | Temp 98.3°F | Ht 68.0 in | Wt 317.0 lb

## 2021-02-02 DIAGNOSIS — R0602 Shortness of breath: Secondary | ICD-10-CM

## 2021-02-02 DIAGNOSIS — E669 Obesity, unspecified: Secondary | ICD-10-CM | POA: Diagnosis not present

## 2021-02-02 DIAGNOSIS — R5383 Other fatigue: Secondary | ICD-10-CM | POA: Diagnosis not present

## 2021-02-02 DIAGNOSIS — R739 Hyperglycemia, unspecified: Secondary | ICD-10-CM | POA: Diagnosis not present

## 2021-02-02 DIAGNOSIS — Z1331 Encounter for screening for depression: Secondary | ICD-10-CM | POA: Diagnosis not present

## 2021-02-02 DIAGNOSIS — I10 Essential (primary) hypertension: Secondary | ICD-10-CM | POA: Diagnosis not present

## 2021-02-02 NOTE — Progress Notes (Signed)
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 Current BMI is Body mass index is 48.2 kg/m. Keith Brown has been struggling with his weight for many years and has been unsuccessful in either losing weight, maintaining weight loss, or reaching his healthy weight goal.  Keith Brown is currently in the action stage of change and ready to dedicate time achieving and maintaining a healthier weight. Keith Brown is interested in becoming our patient and working on intensive lifestyle modifications including (but not limited to) diet and exercise for weight loss.  Keith Brown's habits were reviewed today and are as follows: His family eats meals together, he thinks his family will eat healthier with him, he struggles with family and or coworkers weight loss sabotage, his desired weight loss is 117 lbs, he has been heavy most of his life, he started gaining weight in college during freshman/sophomore, his heaviest weight ever was 320 pounds, he has significant food cravings issues, he snacks frequently in the evenings, he skips meals frequently, he is frequently drinking liquids with calories, he frequently makes poor food choices, he has problems with excessive hunger, he frequently eats larger portions than normal, and he struggles with emotional eating.  Depression Screen Keith Brown's Food and Mood (modified PHQ-9) score was 9.  Depression screen PHQ 2/9 02/02/2021  Decreased Interest 1  Down, Depressed, Hopeless 0  PHQ - 2 Score 1  Altered sleeping 3  Tired, decreased energy 3  Change in appetite 1  Feeling bad or failure about yourself  0  Trouble concentrating 0  Moving slowly or fidgety/restless 1  Suicidal thoughts 0  PHQ-9 Score 9  Difficult doing work/chores Somewhat difficult   Subjective:   1. Other fatigue Keith Brown admits to daytime somnolence and admits to waking up still tired. Patent has a  history of symptoms of daytime fatigue and morning fatigue. Keith Brown generally gets 4 or 5 hours of sleep per night, and states that he has difficulty falling asleep. Snoring is present. Apneic episodes are present. Epworth Sleepiness Score is 9.  2. SOB (shortness of breath) Keith Brown notes increasing shortness of breath with exercising and seems to be worsening over time with weight gain. He notes getting out of breath sooner with activity than he used to. This has not gotten worse recently. Keith Brown denies shortness of breath at rest or orthopnea.  3. Essential hypertension Keith Brown's blood pressure is mildly elevated today. He is ready to work on diet and weight loss.  4. Hyperglycemia Keith Brown has a history of some hyperglycemia and possible episodes of hypoglycemia. He notes polyphagia.  Assessment/Plan:   1. Other fatigue Keith Brown does feel that his weight is causing his energy to be lower than it should be. Fatigue may be related to obesity, depression or many other causes. Labs will be ordered, and in the meanwhile, Keith Brown will focus on self care including making healthy food choices, increasing physical activity and focusing on stress reduction.  - EKG 12-Lead - Folate - Vitamin B12  2. SOB (shortness of breath) Keith Brown does feel that he gets out of breath more easily that he used to when he exercises. Keith Brown's shortness of breath appears to be obesity related and exercise induced. He has agreed to work on weight loss and gradually increase exercise to treat his exercise induced shortness of breath. Will continue to monitor closely.  3. Essential hypertension We will check labs today.  [place] will start his Category 3 plan, and will work on exercise and weight loss to improve blood pressure control. We will watch for signs of hypotension as he continues his lifestyle modifications.  - Comprehensive metabolic panel  4. Hyperglycemia We will check labs today, and results with be discussed with Keith Brown in 2  weeks at his follow up visit. In the meanwhile [name] will follow his Category 3 plan and will work on weight loss efforts.  - Insulin, random  5. Screening for depression Keith Brown had a positive depression screening. Depression is commonly associated with obesity and often results in emotional eating behaviors. We will monitor this closely and work on CBT to help improve the non-hunger eating patterns. Referral to Psychology may be required if no improvement is seen as he continues in our clinic.  6. Obesity with current BMI of 48.3 Keith Brown is currently in the action stage of change and his goal is to continue with weight loss efforts. I recommend Eriverto begin the structured treatment plan as follows:  He has agreed to the Category 3 Plan.  Exercise goals: No exercise has been prescribed at this time.   Behavioral modification strategies: increasing lean protein intake and meal planning and cooking strategies.  He was informed of the importance of frequent follow-up visits to maximize his success with intensive lifestyle modifications for his multiple health conditions. He was informed we would discuss his lab results at his next visit unless there is a critical issue that needs to be addressed sooner. Keith Brown agreed to keep his next visit at the agreed upon time to discuss these results.  Objective:   Blood pressure (!) 153/74, pulse 65, temperature 98.3 F (36.8 C), height 5\' 8"  (1.727 m), weight (!) 317 lb (143.8 kg), SpO2 98 %. Body mass index is 48.2 kg/m.  EKG: Normal sinus rhythm, rate 61 BPM.  Indirect Calorimeter completed today shows a VO2 of 369 and a REE of 2549.  His calculated basal metabolic rate is 1308 thus his basal metabolic rate is worse than expected.  General: Cooperative, alert, well developed, in no acute distress. HEENT: Conjunctivae and lids unremarkable. Cardiovascular: Regular rhythm.  Lungs: Normal work of breathing. Neurologic: No focal deficits.   Lab Results   Component Value Date   CREATININE 1.01 01/03/2021   BUN 20 01/03/2021   NA 138 01/03/2021   K 4.4 01/03/2021   CL 102 01/03/2021   CO2 28 01/03/2021   Lab Results  Component Value Date   ALT 42 01/03/2021   AST 34 01/03/2021   ALKPHOS 62 01/03/2021   BILITOT 0.9 01/03/2021   Lab Results  Component Value Date   HGBA1C 5.5 01/03/2021   HGBA1C 5.5 03/26/2019   HGBA1C 5.3 06/05/2018   HGBA1C 5.6 01/23/2017   No results found for: INSULIN Lab Results  Component Value Date   TSH 2.63 01/03/2021   Lab Results  Component Value Date   CHOL 196 01/03/2021   HDL 58.20 01/03/2021   LDLCALC 119 (H) 01/03/2021   TRIG 93.0 01/03/2021   CHOLHDL 3 01/03/2021   Lab Results  Component Value Date   WBC 8.4 01/03/2021   HGB 16.1 01/03/2021   HCT 48.0 01/03/2021   MCV 89.7 01/03/2021   PLT 145.0 (L) 01/03/2021   Lab Results  Component Value Date   IRON 108 03/31/2020   TIBC 307 03/31/2020   FERRITIN 451 (H) 03/31/2020   Attestation Statements:   Reviewed by clinician on day of visit: allergies, medications,  problem list, medical history, surgical history, family history, social history, and previous encounter notes.   I, Trixie Dredge, am acting as transcriptionist for Dennard Nip, MD.  I have reviewed the above documentation for accuracy and completeness, and I agree with the above. - Dennard Nip, MD

## 2021-02-03 ENCOUNTER — Encounter (INDEPENDENT_AMBULATORY_CARE_PROVIDER_SITE_OTHER): Payer: Self-pay

## 2021-02-03 LAB — COMPREHENSIVE METABOLIC PANEL
ALT: 43 IU/L (ref 0–44)
AST: 37 IU/L (ref 0–40)
Albumin/Globulin Ratio: 1.6 (ref 1.2–2.2)
Albumin: 4.6 g/dL (ref 3.8–4.8)
Alkaline Phosphatase: 77 IU/L (ref 44–121)
BUN/Creatinine Ratio: 16 (ref 10–24)
BUN: 16 mg/dL (ref 8–27)
Bilirubin Total: 0.7 mg/dL (ref 0.0–1.2)
CO2: 19 mmol/L — ABNORMAL LOW (ref 20–29)
Calcium: 9.7 mg/dL (ref 8.6–10.2)
Chloride: 106 mmol/L (ref 96–106)
Creatinine, Ser: 1.01 mg/dL (ref 0.76–1.27)
Globulin, Total: 2.9 g/dL (ref 1.5–4.5)
Glucose: 89 mg/dL (ref 70–99)
Potassium: 4.6 mmol/L (ref 3.5–5.2)
Sodium: 141 mmol/L (ref 134–144)
Total Protein: 7.5 g/dL (ref 6.0–8.5)
eGFR: 83 mL/min/{1.73_m2} (ref >59)

## 2021-02-03 LAB — VITAMIN B12: Vitamin B-12: 536 pg/mL (ref 232–1245)

## 2021-02-03 LAB — FOLATE: Folate: 11.8 ng/mL (ref >3.0)

## 2021-02-03 LAB — INSULIN, RANDOM: INSULIN: 20.6 u[IU]/mL (ref 2.6–24.9)

## 2021-02-16 ENCOUNTER — Encounter (INDEPENDENT_AMBULATORY_CARE_PROVIDER_SITE_OTHER): Payer: Self-pay | Admitting: Family Medicine

## 2021-02-16 ENCOUNTER — Other Ambulatory Visit: Payer: Self-pay

## 2021-02-16 ENCOUNTER — Ambulatory Visit (INDEPENDENT_AMBULATORY_CARE_PROVIDER_SITE_OTHER): Payer: No Typology Code available for payment source | Admitting: Family Medicine

## 2021-02-16 VITALS — BP 164/78 | HR 69 | Temp 98.0°F | Ht 68.0 in | Wt 312.0 lb

## 2021-02-16 DIAGNOSIS — E669 Obesity, unspecified: Secondary | ICD-10-CM | POA: Diagnosis not present

## 2021-02-16 DIAGNOSIS — Z9189 Other specified personal risk factors, not elsewhere classified: Secondary | ICD-10-CM

## 2021-02-16 DIAGNOSIS — I10 Essential (primary) hypertension: Secondary | ICD-10-CM

## 2021-02-16 DIAGNOSIS — Z6841 Body Mass Index (BMI) 40.0 and over, adult: Secondary | ICD-10-CM

## 2021-02-16 DIAGNOSIS — E8881 Metabolic syndrome: Secondary | ICD-10-CM | POA: Diagnosis not present

## 2021-02-17 NOTE — Progress Notes (Signed)
Chief Complaint:   OBESITY Keith Brown is here to discuss his progress with his obesity treatment plan along with follow-up of his obesity related diagnoses. Keith Brown is on the Category 3 Plan and states he is following his eating plan approximately 80% of the time. Keith Brown states he is doing 0 minutes 0 times per week.  Today's visit was #: 2 Starting weight: 317 lbs Starting date: 02/02/2021 Today's weight: 312 lbs Today's date: 02/16/2021 Total lbs lost to date: 5 Total lbs lost since last in-office visit: 5  Interim History: Keith Brown has done well with weight loss on his Category 3 plan. He is getting goof support from his family, and he is working on increasing lean protein. His hunger is mostly controlled, but not always.  Subjective:   1. Insulin resistance Keith Brown has a new diagnosis of insulin resistance. His glucose and A1c are within normal limits, but his fasting insulin is elevated. He had at least 1 episode of feeling hypoglycemic. He still notes some polyphagia. I discussed labs with the patient today.  2. Essential hypertension Keith Brown's blood pressure is elevated today. He feels this may be due to not taking his medications at a regular time.  3. At risk for diabetes mellitus Keith Brown is at higher than average risk for developing diabetes due to his obesity.  Assessment/Plan:   1. Insulin resistance Keith Brown deferred metformin. He will continue to decrease simple carbohydrates and increase lean protein. Keith Brown agreed to follow-up with [place] as directed to closely monitor his progress.  2. Essential hypertension Keith Brown will continue with diet, exercise, and weight loss. He will work on regular medication timing to help improve blood pressure control. We will recheck his blood pressure in 2-3 weeks as he continues his lifestyle modifications.  3. At risk for diabetes mellitus Keith Brown was given approximately 30 minutes of diabetic education and counseling today. We discussed intensive  lifestyle modifications today with an emphasis on weight loss as well as increasing exercise and decreasing simple carbohydrates in his diet. We also reviewed medication options with an emphasis on risk versus benefits of those discussed.  Repetitive spaced learning was employed today to elicit superior memory formation and behavioral change.  4. Obesity with current BMI of 47.4 Keith Brown is currently in the action stage of change. As such, his goal is to continue with weight loss efforts. He has agreed to the Category 3 Plan.   Behavioral modification strategies: increasing lean protein intake, decreasing simple carbohydrates, and no skipping meals.  Keith Brown has agreed to follow-up with our clinic in 2 to 3 weeks. He was informed of the importance of frequent follow-up visits to maximize his success with intensive lifestyle modifications for his multiple health conditions.   Objective:   Blood pressure (!) 164/78, pulse 69, temperature 98 F (36.7 C), height 5\' 8"  (1.727 m), weight (!) 312 lb (141.5 kg), SpO2 98 %. Body mass index is 47.44 kg/m.  General: Cooperative, alert, well developed, in no acute distress. HEENT: Conjunctivae and lids unremarkable. Cardiovascular: Regular rhythm.  Lungs: Normal work of breathing. Neurologic: No focal deficits.   Lab Results  Component Value Date   CREATININE 1.01 02/02/2021   BUN 16 02/02/2021   NA 141 02/02/2021   K 4.6 02/02/2021   CL 106 02/02/2021   CO2 19 (L) 02/02/2021   Lab Results  Component Value Date   ALT 43 02/02/2021   AST 37 02/02/2021   ALKPHOS 77 02/02/2021   BILITOT 0.7 02/02/2021   Lab  Results  Component Value Date   HGBA1C 5.5 01/03/2021   HGBA1C 5.5 03/26/2019   HGBA1C 5.3 06/05/2018   HGBA1C 5.6 01/23/2017   Lab Results  Component Value Date   INSULIN 20.6 02/02/2021   Lab Results  Component Value Date   TSH 2.63 01/03/2021   Lab Results  Component Value Date   CHOL 196 01/03/2021   HDL 58.20 01/03/2021    LDLCALC 119 (H) 01/03/2021   TRIG 93.0 01/03/2021   CHOLHDL 3 01/03/2021   Lab Results  Component Value Date   VD25OH 61.59 01/03/2021   VD25OH 59 03/31/2020   VD25OH 40 03/26/2019   Lab Results  Component Value Date   WBC 8.4 01/03/2021   HGB 16.1 01/03/2021   HCT 48.0 01/03/2021   MCV 89.7 01/03/2021   PLT 145.0 (L) 01/03/2021   Lab Results  Component Value Date   IRON 108 03/31/2020   TIBC 307 03/31/2020   FERRITIN 451 (H) 03/31/2020   Attestation Statements:   Reviewed by clinician on day of visit: allergies, medications, problem list, medical history, surgical history, family history, social history, and previous encounter notes.   I, Trixie Dredge, am acting as transcriptionist for Dennard Nip, MD.  I have reviewed the above documentation for accuracy and completeness, and I agree with the above. -  Dennard Nip, MD

## 2021-03-01 ENCOUNTER — Encounter (INDEPENDENT_AMBULATORY_CARE_PROVIDER_SITE_OTHER): Payer: Self-pay | Admitting: Family Medicine

## 2021-03-01 ENCOUNTER — Other Ambulatory Visit (HOSPITAL_COMMUNITY): Payer: Self-pay

## 2021-03-01 ENCOUNTER — Ambulatory Visit (INDEPENDENT_AMBULATORY_CARE_PROVIDER_SITE_OTHER): Payer: No Typology Code available for payment source | Admitting: Family Medicine

## 2021-03-01 ENCOUNTER — Other Ambulatory Visit: Payer: Self-pay

## 2021-03-01 VITALS — BP 145/70 | HR 55 | Temp 98.3°F | Ht 68.0 in | Wt 305.0 lb

## 2021-03-01 DIAGNOSIS — I10 Essential (primary) hypertension: Secondary | ICD-10-CM

## 2021-03-01 DIAGNOSIS — E669 Obesity, unspecified: Secondary | ICD-10-CM

## 2021-03-01 DIAGNOSIS — E790 Hyperuricemia without signs of inflammatory arthritis and tophaceous disease: Secondary | ICD-10-CM

## 2021-03-01 DIAGNOSIS — R739 Hyperglycemia, unspecified: Secondary | ICD-10-CM

## 2021-03-01 DIAGNOSIS — Z6841 Body Mass Index (BMI) 40.0 and over, adult: Secondary | ICD-10-CM

## 2021-03-01 MED ORDER — ALLOPURINOL 300 MG PO TABS
300.0000 mg | ORAL_TABLET | Freq: Every day | ORAL | 0 refills | Status: DC
Start: 2021-03-01 — End: 2021-07-21
  Filled 2021-03-01: qty 18, 18d supply, fill #0
  Filled 2021-03-01: qty 72, 72d supply, fill #0

## 2021-03-02 ENCOUNTER — Ambulatory Visit (INDEPENDENT_AMBULATORY_CARE_PROVIDER_SITE_OTHER): Payer: No Typology Code available for payment source | Admitting: Family Medicine

## 2021-03-02 NOTE — Progress Notes (Signed)
Chief Complaint:   OBESITY Keith Brown is here to discuss his progress with his obesity treatment plan along with follow-up of his obesity related diagnoses. Keith Brown is on the Category 3 Plan and states he is following his eating plan approximately 85% of the time. Keith Brown states he is doing 0 minutes 0 times per week.  Today's visit was #: 3 Starting weight: 317 lbs Starting date: 02/02/2021 Today's weight: 305 lbs Today's date: 03/01/2021 Total lbs lost to date: 12 Total lbs lost since last in-office visit: 7  Interim History: Keith Brown continues to do well with weight loss. He is getting  good support at home with meal planning.  Subjective:   1. Essential hypertension Keith Brown's blood pressure is mildly elevated today. It has improved in the last month.   2. Hyperuricemia Keith Brown is stable, and he is working on weight loss. He requests a refill of allopurinol today.  Assessment/Plan:   1. Essential hypertension Keith Brown will continue his diet, exercise, weight loss, and medications to improve blood pressure control. We will continue to monitor as he continues his lifestyle modifications.  2. Hyperuricemia Keith Brown will continue allopurinol and we will refill with a 90 day supply with no refills.   - allopurinol (ZYLOPRIM) 300 MG tablet; Take 1 tablet (300 mg total) by mouth daily.  Dispense: 90 tablet; Refill: 0  3. Obesity with current BMI of 46.5 Keith Brown is currently in the action stage of change. As such, his goal is to continue with weight loss efforts. He has agreed to the Category 3 Plan.   Keith Brown recipes were given.  Behavioral modification strategies: increasing lean protein intake and meal planning and cooking strategies.  Keith Brown has agreed to follow-up with our clinic in 2 weeks. He was informed of the importance of frequent follow-up visits to maximize his success with intensive lifestyle modifications for his multiple health conditions.   Objective:   Blood pressure (!)  145/70, pulse (!) 55, temperature 98.3 F (36.8 C), height 5\' 8"  (1.727 m), weight (!) 305 lb (138.3 kg), SpO2 93 %. Body mass index is 46.38 kg/m.  General: Cooperative, alert, well developed, in no acute distress. HEENT: Conjunctivae and lids unremarkable. Cardiovascular: Regular rhythm.  Lungs: Normal work of breathing. Neurologic: No focal deficits.   Lab Results  Component Value Date   CREATININE 1.01 02/02/2021   BUN 16 02/02/2021   NA 141 02/02/2021   K 4.6 02/02/2021   CL 106 02/02/2021   CO2 19 (L) 02/02/2021   Lab Results  Component Value Date   ALT 43 02/02/2021   AST 37 02/02/2021   ALKPHOS 77 02/02/2021   BILITOT 0.7 02/02/2021   Lab Results  Component Value Date   HGBA1C 5.5 01/03/2021   HGBA1C 5.5 03/26/2019   HGBA1C 5.3 06/05/2018   HGBA1C 5.6 01/23/2017   Lab Results  Component Value Date   INSULIN 20.6 02/02/2021   Lab Results  Component Value Date   TSH 2.63 01/03/2021   Lab Results  Component Value Date   CHOL 196 01/03/2021   HDL 58.20 01/03/2021   LDLCALC 119 (H) 01/03/2021   TRIG 93.0 01/03/2021   CHOLHDL 3 01/03/2021   Lab Results  Component Value Date   VD25OH 61.59 01/03/2021   VD25OH 59 03/31/2020   VD25OH 40 03/26/2019   Lab Results  Component Value Date   WBC 8.4 01/03/2021   HGB 16.1 01/03/2021   HCT 48.0 01/03/2021   MCV 89.7 01/03/2021   PLT 145.0 (L)  01/03/2021   Lab Results  Component Value Date   IRON 108 03/31/2020   TIBC 307 03/31/2020   FERRITIN 451 (H) 03/31/2020   Attestation Statements:   Reviewed by clinician on day of visit: allergies, medications, problem list, medical history, surgical history, family history, social history, and previous encounter notes.   I, Trixie Dredge, am acting as transcriptionist for Dennard Nip, MD.  I have reviewed the above documentation for accuracy and completeness, and I agree with the above. -  Dennard Nip, MD

## 2021-03-23 ENCOUNTER — Ambulatory Visit (INDEPENDENT_AMBULATORY_CARE_PROVIDER_SITE_OTHER): Payer: No Typology Code available for payment source | Admitting: Family Medicine

## 2021-03-29 ENCOUNTER — Other Ambulatory Visit: Payer: Self-pay

## 2021-03-29 ENCOUNTER — Ambulatory Visit (INDEPENDENT_AMBULATORY_CARE_PROVIDER_SITE_OTHER): Payer: No Typology Code available for payment source | Admitting: Family Medicine

## 2021-03-29 ENCOUNTER — Encounter (INDEPENDENT_AMBULATORY_CARE_PROVIDER_SITE_OTHER): Payer: Self-pay | Admitting: Family Medicine

## 2021-03-29 VITALS — BP 169/92 | HR 67 | Ht 68.0 in | Wt 301.0 lb

## 2021-03-29 DIAGNOSIS — E559 Vitamin D deficiency, unspecified: Secondary | ICD-10-CM | POA: Diagnosis not present

## 2021-03-29 DIAGNOSIS — E669 Obesity, unspecified: Secondary | ICD-10-CM | POA: Diagnosis not present

## 2021-03-29 DIAGNOSIS — I10 Essential (primary) hypertension: Secondary | ICD-10-CM

## 2021-03-29 DIAGNOSIS — Z6841 Body Mass Index (BMI) 40.0 and over, adult: Secondary | ICD-10-CM

## 2021-04-04 NOTE — Progress Notes (Signed)
? ? ? ?Chief Complaint:  ? ?OBESITY ?Keith Brown is here to discuss his progress with his obesity treatment plan along with follow-up of his obesity related diagnoses. Levorn is on the Category 3 Plan and states he is following his eating plan approximately 60% of the time. Kavari states he is doing 0 minutes 0 times per week. ? ?Today's visit was #: 4 ?Starting weight: 317 lbs ?Starting date: 02/02/2021 ?Today's weight: 301 lbs ?Today's date: 03/29/2021 ?Total lbs lost to date: 78 ?Total lbs lost since last in-office visit: 4 ? ?Interim History: [name] continues to do very well with weight loss even while traveling. He did a lot of walking. He is doing well with his Category 3 plan overall. ? ?Subjective:  ? ?1. Essential hypertension ?[name] forgot to take his blood pressure medications today. He has been stable otherwise. He denies chest pain or headache. ? ?2. Vitamin D deficiency ?[name] is on OTC Vit D, with no side effects notes. He got more sun while on vacation. ? ?Assessment/Plan:  ? ?1. Essential hypertension ?[name] will continue his diet, exercise, and medications to improve blood pressure control. We will recheck his blood pressure in 2 weeks.  ? ?2. Vitamin D deficiency ?[name] will continue OTC Vitamin D 2,000 IU daily, and we will recheck labs in 1-2 months. He will follow-up for routine testing of Vitamin D, at least 2-3 times per year to avoid over-replacement. ? ?3. Obesity with current BMI of 45.8 ?[name] is currently in the action stage of change. As such, his goal is to continue with weight loss efforts. He has agreed to the Category 3 Plan.  ? ?Exercise goals: 5-10 minutes of strengthening exercises. ? ?Behavioral modification strategies: increasing lean protein intake and meal planning and cooking strategies. ? ?[name] has agreed to follow-up with our clinic in 2 weeks. He was informed of the importance of frequent follow-up visits to maximize his success with intensive lifestyle modifications for his  multiple health conditions.  ? ?Objective:  ? ?Blood pressure (!) 169/92, pulse 67, height '5\' 8"'$  (1.727 m), weight (!) 301 lb (136.5 kg), SpO2 97 %. ?Body mass index is 45.77 kg/m?. ? ?General: Cooperative, alert, well developed, in no acute distress. ?HEENT: Conjunctivae and lids unremarkable. ?Cardiovascular: Regular rhythm.  ?Lungs: Normal work of breathing. ?Neurologic: No focal deficits.  ? ?Lab Results  ?Component Value Date  ? CREATININE 1.01 02/02/2021  ? BUN 16 02/02/2021  ? NA 141 02/02/2021  ? K 4.6 02/02/2021  ? CL 106 02/02/2021  ? CO2 19 (L) 02/02/2021  ? ?Lab Results  ?Component Value Date  ? ALT 43 02/02/2021  ? AST 37 02/02/2021  ? ALKPHOS 77 02/02/2021  ? BILITOT 0.7 02/02/2021  ? ?Lab Results  ?Component Value Date  ? HGBA1C 5.5 01/03/2021  ? HGBA1C 5.5 03/26/2019  ? HGBA1C 5.3 06/05/2018  ? HGBA1C 5.6 01/23/2017  ? ?Lab Results  ?Component Value Date  ? INSULIN 20.6 02/02/2021  ? ?Lab Results  ?Component Value Date  ? TSH 2.63 01/03/2021  ? ?Lab Results  ?Component Value Date  ? CHOL 196 01/03/2021  ? HDL 58.20 01/03/2021  ? LDLCALC 119 (H) 01/03/2021  ? TRIG 93.0 01/03/2021  ? CHOLHDL 3 01/03/2021  ? ?Lab Results  ?Component Value Date  ? VD25OH 61.59 01/03/2021  ? VD25OH 59 03/31/2020  ? VD25OH 40 03/26/2019  ? ?Lab Results  ?Component Value Date  ? WBC 8.4 01/03/2021  ? HGB 16.1 01/03/2021  ? HCT 48.0 01/03/2021  ?  MCV 89.7 01/03/2021  ? PLT 145.0 (L) 01/03/2021  ? ?Lab Results  ?Component Value Date  ? IRON 108 03/31/2020  ? TIBC 307 03/31/2020  ? FERRITIN 451 (H) 03/31/2020  ? ?Attestation Statements:  ? ?Reviewed by clinician on day of visit: allergies, medications, problem list, medical history, surgical history, family history, social history, and previous encounter notes. ? ?Time spent on visit including pre-visit chart review and post-visit care and charting was 30 minutes.  ? ? ?I, Trixie Dredge, am acting as transcriptionist for Dennard Nip, MD. ? ?I have reviewed the above  documentation for accuracy and completeness, and I agree with the above. -  Dennard Nip, MD ? ? ?

## 2021-04-13 ENCOUNTER — Encounter (INDEPENDENT_AMBULATORY_CARE_PROVIDER_SITE_OTHER): Payer: Self-pay | Admitting: Family Medicine

## 2021-04-13 ENCOUNTER — Ambulatory Visit (INDEPENDENT_AMBULATORY_CARE_PROVIDER_SITE_OTHER): Payer: No Typology Code available for payment source | Admitting: Family Medicine

## 2021-04-13 VITALS — BP 145/76 | HR 70 | Temp 98.1°F | Ht 68.0 in | Wt 299.0 lb

## 2021-04-13 DIAGNOSIS — I10 Essential (primary) hypertension: Secondary | ICD-10-CM

## 2021-04-13 DIAGNOSIS — Z6841 Body Mass Index (BMI) 40.0 and over, adult: Secondary | ICD-10-CM | POA: Diagnosis not present

## 2021-04-13 DIAGNOSIS — E669 Obesity, unspecified: Secondary | ICD-10-CM

## 2021-04-18 NOTE — Progress Notes (Signed)
? ? ? ?Chief Complaint:  ? ?OBESITY ?Keith Brown is here to discuss his progress with his obesity treatment plan along with follow-up of his obesity related diagnoses. Keith Brown is on the Category 3 Plan and states he is following his eating plan approximately 50% of the time. Keith Brown states he is doing some walking.   ? ?Today's visit was #: 5 ?Starting weight: 317 lbs ?Starting date: 02/02/2021 ?Today's weight: 299 lbs ?Today's date: 04/13/2021 ?Total lbs lost to date: 91 ?Total lbs lost since last in-office visit: 3 ? ?Interim History: Keith Brown continues to do well with weight loss. He is starting to walk and he is getting ready to start increasing this. ? ?Subjective:  ? ?1. Essential hypertension ?Keith Brown continues to work on his diet and weight loss, and his blood pressure continues to improve.  ? ?Assessment/Plan:  ? ?1. Essential hypertension ?Keith Brown will continue his diet, exercise, and medications, and we will follow as he continues his lifestyle modifications. ? ?2. Obesity with current BMI of 45.5 ?Keith Brown is currently in the action stage of change. As such, his goal is to continue with weight loss efforts. He has agreed to the Category 3 Plan.  ? ?Behavioral modification strategies: increasing lean protein intake, holiday eating strategies , and celebration eating strategies. ? ?Keith Brown has agreed to follow-up with our clinic in 4 weeks. He was informed of the importance of frequent follow-up visits to maximize his success with intensive lifestyle modifications for his multiple health conditions.  ? ?Objective:  ? ?Blood pressure (!) 145/76, pulse 70, temperature 98.1 ?F (36.7 ?C), height '5\' 8"'$  (1.727 m), weight 299 lb (135.6 kg), SpO2 97 %. ?Body mass index is 45.46 kg/m?. ? ?General: Cooperative, alert, well developed, in no acute distress. ?HEENT: Conjunctivae and lids unremarkable. ?Cardiovascular: Regular rhythm.  ?Lungs: Normal work of breathing. ?Neurologic: No focal deficits.  ? ?Lab Results  ?Component Value Date  ?  CREATININE 1.01 02/02/2021  ? BUN 16 02/02/2021  ? NA 141 02/02/2021  ? K 4.6 02/02/2021  ? CL 106 02/02/2021  ? CO2 19 (L) 02/02/2021  ? ?Lab Results  ?Component Value Date  ? ALT 43 02/02/2021  ? AST 37 02/02/2021  ? ALKPHOS 77 02/02/2021  ? BILITOT 0.7 02/02/2021  ? ?Lab Results  ?Component Value Date  ? HGBA1C 5.5 01/03/2021  ? HGBA1C 5.5 03/26/2019  ? HGBA1C 5.3 06/05/2018  ? HGBA1C 5.6 01/23/2017  ? ?Lab Results  ?Component Value Date  ? INSULIN 20.6 02/02/2021  ? ?Lab Results  ?Component Value Date  ? TSH 2.63 01/03/2021  ? ?Lab Results  ?Component Value Date  ? CHOL 196 01/03/2021  ? HDL 58.20 01/03/2021  ? LDLCALC 119 (H) 01/03/2021  ? TRIG 93.0 01/03/2021  ? CHOLHDL 3 01/03/2021  ? ?Lab Results  ?Component Value Date  ? VD25OH 61.59 01/03/2021  ? VD25OH 59 03/31/2020  ? VD25OH 40 03/26/2019  ? ?Lab Results  ?Component Value Date  ? WBC 8.4 01/03/2021  ? HGB 16.1 01/03/2021  ? HCT 48.0 01/03/2021  ? MCV 89.7 01/03/2021  ? PLT 145.0 (L) 01/03/2021  ? ?Lab Results  ?Component Value Date  ? IRON 108 03/31/2020  ? TIBC 307 03/31/2020  ? FERRITIN 451 (H) 03/31/2020  ? ?Attestation Statements:  ? ?Reviewed by clinician on day of visit: allergies, medications, problem list, medical history, surgical history, family history, social history, and previous encounter notes. ? ? ?I, Trixie Dredge, am acting as transcriptionist for Dennard Nip, MD. ? ?I have reviewed  the above documentation for accuracy and completeness, and I agree with the above. -  Dennard Nip, MD ? ? ?

## 2021-04-27 ENCOUNTER — Encounter (INDEPENDENT_AMBULATORY_CARE_PROVIDER_SITE_OTHER): Payer: Self-pay | Admitting: Family Medicine

## 2021-04-27 ENCOUNTER — Ambulatory Visit (INDEPENDENT_AMBULATORY_CARE_PROVIDER_SITE_OTHER): Payer: No Typology Code available for payment source | Admitting: Family Medicine

## 2021-04-27 VITALS — BP 153/68 | HR 66 | Temp 98.4°F | Ht 68.0 in | Wt 300.0 lb

## 2021-04-27 DIAGNOSIS — I10 Essential (primary) hypertension: Secondary | ICD-10-CM

## 2021-04-27 DIAGNOSIS — Z6841 Body Mass Index (BMI) 40.0 and over, adult: Secondary | ICD-10-CM | POA: Diagnosis not present

## 2021-04-27 DIAGNOSIS — E669 Obesity, unspecified: Secondary | ICD-10-CM

## 2021-04-29 NOTE — Progress Notes (Signed)
? ? ? ?Chief Complaint:  ? ?OBESITY ?Keith Brown is here to discuss his progress with his obesity treatment plan along with follow-up of his obesity related diagnoses. Keith Brown is on the Category 3 Plan and states he is following his eating plan approximately 50% of the time. Keith Brown states he is doing 0 minutes 0 times per week. ? ?Today's visit was #: 6 ?Starting weight: 317 lbs ?Starting date: 02/02/2021 ?Today's weight: 300 lbs ?Today's date: 04/27/2021 ?Total lbs lost to date: 4 ?Total lbs lost since last in-office visit: 0 ? ?Interim History: Keith Brown is retaining some fluid today. He continue to work on his diet and weight loss. He is deviating from his plan and his RMR may be decreasing. ? ?Subjective:  ? ?1. Essential hypertension ?Knowledge's blood pressure remains elevated. He continues to work on weight loss, but may be eating too much sodium.  ? ?Assessment/Plan:  ? ?1. Essential hypertension ?Keith Brown will continue his diet, and work on decreasing his sodium intake. He is to discuss other medication options with his PCP.  ? ?2. Obesity with current BMI of 45.7 ?Keith Brown is currently in the action stage of change. As such, his goal is to continue with weight loss efforts. He has agreed to the Category 3 Plan.  ? ?Behavioral modification strategies: increasing lean protein intake, increasing water intake, and decreasing sodium intake. ? ?Keith Brown has agreed to follow-up with our clinic in 4 weeks. He was informed of the importance of frequent follow-up visits to maximize his success with intensive lifestyle modifications for his multiple health conditions.  ? ?Objective:  ? ?Blood pressure (!) 153/68, pulse 66, temperature 98.4 ?F (36.9 ?C), height '5\' 8"'$  (1.727 m), weight 300 lb (136.1 kg), SpO2 98 %. ?Body mass index is 45.61 kg/m?. ? ?General: Cooperative, alert, well developed, in no acute distress. ?HEENT: Conjunctivae and lids unremarkable. ?Cardiovascular: Regular rhythm.  ?Lungs: Normal work of breathing. ?Neurologic: No  focal deficits.  ? ?Lab Results  ?Component Value Date  ? CREATININE 1.01 02/02/2021  ? BUN 16 02/02/2021  ? NA 141 02/02/2021  ? K 4.6 02/02/2021  ? CL 106 02/02/2021  ? CO2 19 (L) 02/02/2021  ? ?Lab Results  ?Component Value Date  ? ALT 43 02/02/2021  ? AST 37 02/02/2021  ? ALKPHOS 77 02/02/2021  ? BILITOT 0.7 02/02/2021  ? ?Lab Results  ?Component Value Date  ? HGBA1C 5.5 01/03/2021  ? HGBA1C 5.5 03/26/2019  ? HGBA1C 5.3 06/05/2018  ? HGBA1C 5.6 01/23/2017  ? ?Lab Results  ?Component Value Date  ? INSULIN 20.6 02/02/2021  ? ?Lab Results  ?Component Value Date  ? TSH 2.63 01/03/2021  ? ?Lab Results  ?Component Value Date  ? CHOL 196 01/03/2021  ? HDL 58.20 01/03/2021  ? LDLCALC 119 (H) 01/03/2021  ? TRIG 93.0 01/03/2021  ? CHOLHDL 3 01/03/2021  ? ?Lab Results  ?Component Value Date  ? VD25OH 61.59 01/03/2021  ? VD25OH 59 03/31/2020  ? VD25OH 40 03/26/2019  ? ?Lab Results  ?Component Value Date  ? WBC 8.4 01/03/2021  ? HGB 16.1 01/03/2021  ? HCT 48.0 01/03/2021  ? MCV 89.7 01/03/2021  ? PLT 145.0 (L) 01/03/2021  ? ?Lab Results  ?Component Value Date  ? IRON 108 03/31/2020  ? TIBC 307 03/31/2020  ? FERRITIN 451 (H) 03/31/2020  ? ?Attestation Statements:  ? ?Reviewed by clinician on day of visit: allergies, medications, problem list, medical history, surgical history, family history, social history, and previous encounter notes. ? ?Time spent  on visit including pre-visit chart review and post-visit care and charting was 30 minutes.  ? ? ?I, Trixie Dredge, am acting as transcriptionist for Dennard Nip, MD. ? ?I have reviewed the above documentation for accuracy and completeness, and I agree with the above. -  Dennard Nip, MD ? ? ?

## 2021-05-11 ENCOUNTER — Encounter (INDEPENDENT_AMBULATORY_CARE_PROVIDER_SITE_OTHER): Payer: Self-pay | Admitting: Family Medicine

## 2021-05-11 ENCOUNTER — Ambulatory Visit (INDEPENDENT_AMBULATORY_CARE_PROVIDER_SITE_OTHER): Payer: No Typology Code available for payment source | Admitting: Family Medicine

## 2021-05-11 VITALS — BP 159/73 | HR 67 | Temp 98.6°F | Ht 68.0 in | Wt 298.0 lb

## 2021-05-11 DIAGNOSIS — Z6841 Body Mass Index (BMI) 40.0 and over, adult: Secondary | ICD-10-CM | POA: Diagnosis not present

## 2021-05-11 DIAGNOSIS — I10 Essential (primary) hypertension: Secondary | ICD-10-CM | POA: Diagnosis not present

## 2021-05-11 DIAGNOSIS — E669 Obesity, unspecified: Secondary | ICD-10-CM

## 2021-05-25 NOTE — Progress Notes (Signed)
Chief Complaint:   OBESITY Keith Brown is here to discuss his progress with his obesity treatment plan along with follow-up of his obesity related diagnoses. Keith Brown is on the Category 3 Plan and states he is following his eating plan approximately 50% of the time. Keith Brown states he is doing 0 minutes 0 times per week.  Today's visit was #: 7 Starting weight: 317 lbs Starting date: 02/02/2021 Today's weight: 298 lbs Today's date: 05/11/2021 Total lbs lost to date: 19 Total lbs lost since last in-office visit: 2  Interim History: Keith Brown continues to do well with weight loss even while on vacation this weekend at the beach. His hunger is controlled. He  continues to work on meeting his protein goals.  Subjective:   1. Essential hypertension Keith Brown's blood pressure is elevated today. He continues to work on his diet and weight loss. His goal is to improve with lifestyle changes.  Assessment/Plan:   1. Essential hypertension Keith Brown will continue with his diet and exercise, and we will follow up on his blood pressure at his next office visit.  2. Obesity with current BMI of 45.3 Keith Brown is currently in the action stage of change. As such, his goal is to continue with weight loss efforts. He has agreed to the Category 3 Plan.   Behavioral modification strategies: increasing lean protein intake and meal planning and cooking strategies.  Keith Brown has agreed to follow-up with our clinic in 3 weeks. He was informed of the importance of frequent follow-up visits to maximize his success with intensive lifestyle modifications for his multiple health conditions.   Objective:   Blood pressure (!) 159/73, pulse 67, temperature 98.6 F (37 C), height '5\' 8"'$  (1.727 m), weight 298 lb (135.2 kg), SpO2 98 %. Body mass index is 45.31 kg/m.  General: Cooperative, alert, well developed, in no acute distress. HEENT: Conjunctivae and lids unremarkable. Cardiovascular: Regular rhythm.  Lungs: Normal work of  breathing. Neurologic: No focal deficits.   Lab Results  Component Value Date   CREATININE 1.01 02/02/2021   BUN 16 02/02/2021   NA 141 02/02/2021   K 4.6 02/02/2021   CL 106 02/02/2021   CO2 19 (L) 02/02/2021   Lab Results  Component Value Date   ALT 43 02/02/2021   AST 37 02/02/2021   ALKPHOS 77 02/02/2021   BILITOT 0.7 02/02/2021   Lab Results  Component Value Date   HGBA1C 5.5 01/03/2021   HGBA1C 5.5 03/26/2019   HGBA1C 5.3 06/05/2018   HGBA1C 5.6 01/23/2017   Lab Results  Component Value Date   INSULIN 20.6 02/02/2021   Lab Results  Component Value Date   TSH 2.63 01/03/2021   Lab Results  Component Value Date   CHOL 196 01/03/2021   HDL 58.20 01/03/2021   LDLCALC 119 (H) 01/03/2021   TRIG 93.0 01/03/2021   CHOLHDL 3 01/03/2021   Lab Results  Component Value Date   VD25OH 61.59 01/03/2021   VD25OH 59 03/31/2020   VD25OH 40 03/26/2019   Lab Results  Component Value Date   WBC 8.4 01/03/2021   HGB 16.1 01/03/2021   HCT 48.0 01/03/2021   MCV 89.7 01/03/2021   PLT 145.0 (L) 01/03/2021   Lab Results  Component Value Date   IRON 108 03/31/2020   TIBC 307 03/31/2020   FERRITIN 451 (H) 03/31/2020   Attestation Statements:   Reviewed by clinician on day of visit: allergies, medications, problem list, medical history, surgical history, family history, social history, and previous  encounter notes.   I, Trixie Dredge, am acting as transcriptionist for Dennard Nip, MD.  I have reviewed the above documentation for accuracy and completeness, and I agree with the above. -  Dennard Nip, MD

## 2021-05-26 ENCOUNTER — Ambulatory Visit (INDEPENDENT_AMBULATORY_CARE_PROVIDER_SITE_OTHER): Payer: No Typology Code available for payment source | Admitting: Family Medicine

## 2021-05-31 ENCOUNTER — Other Ambulatory Visit (HOSPITAL_COMMUNITY): Payer: Self-pay

## 2021-05-31 ENCOUNTER — Ambulatory Visit (INDEPENDENT_AMBULATORY_CARE_PROVIDER_SITE_OTHER): Payer: No Typology Code available for payment source | Admitting: Family Medicine

## 2021-05-31 ENCOUNTER — Encounter (INDEPENDENT_AMBULATORY_CARE_PROVIDER_SITE_OTHER): Payer: Self-pay | Admitting: Family Medicine

## 2021-05-31 VITALS — BP 163/73 | HR 74 | Temp 98.4°F | Ht 68.0 in | Wt 295.0 lb

## 2021-05-31 DIAGNOSIS — Z6841 Body Mass Index (BMI) 40.0 and over, adult: Secondary | ICD-10-CM

## 2021-05-31 DIAGNOSIS — I10 Essential (primary) hypertension: Secondary | ICD-10-CM | POA: Diagnosis not present

## 2021-05-31 DIAGNOSIS — E669 Obesity, unspecified: Secondary | ICD-10-CM | POA: Diagnosis not present

## 2021-05-31 DIAGNOSIS — E559 Vitamin D deficiency, unspecified: Secondary | ICD-10-CM

## 2021-05-31 MED ORDER — LOSARTAN POTASSIUM 100 MG PO TABS
100.0000 mg | ORAL_TABLET | Freq: Every day | ORAL | 3 refills | Status: DC
  Filled 2021-05-31: qty 90, 90d supply, fill #0
  Filled 2021-09-29: qty 90, 90d supply, fill #1

## 2021-06-14 ENCOUNTER — Ambulatory Visit (INDEPENDENT_AMBULATORY_CARE_PROVIDER_SITE_OTHER): Payer: No Typology Code available for payment source | Admitting: Family Medicine

## 2021-06-14 ENCOUNTER — Encounter (INDEPENDENT_AMBULATORY_CARE_PROVIDER_SITE_OTHER): Payer: Self-pay | Admitting: Family Medicine

## 2021-06-14 VITALS — BP 147/75 | HR 71 | Temp 97.9°F | Ht 68.0 in | Wt 290.0 lb

## 2021-06-14 DIAGNOSIS — E785 Hyperlipidemia, unspecified: Secondary | ICD-10-CM

## 2021-06-14 DIAGNOSIS — Z6841 Body Mass Index (BMI) 40.0 and over, adult: Secondary | ICD-10-CM

## 2021-06-14 DIAGNOSIS — E669 Obesity, unspecified: Secondary | ICD-10-CM

## 2021-06-14 DIAGNOSIS — E7849 Other hyperlipidemia: Secondary | ICD-10-CM | POA: Diagnosis not present

## 2021-06-14 DIAGNOSIS — E559 Vitamin D deficiency, unspecified: Secondary | ICD-10-CM

## 2021-06-14 NOTE — Progress Notes (Signed)
Chief Complaint:   OBESITY Keith Brown is here to discuss his progress with his obesity treatment plan along with follow-up of his obesity related diagnoses. Keith Brown is on the Category 3 Plan and states he is following his eating plan approximately 40-60% of the time. Keith Brown states he is doing 0 minutes 0 times per week.  Today's visit was #: 8 Starting weight: 317 lbs Starting date: 02/02/2021 Today's weight: 295 lbs Today's date: 05/31/2021 Total lbs lost to date: 22 Total lbs lost since last in-office visit: 3  Interim History: Keith Brown continues to do well with weight loss. He is working on increasing his protein. He notes his hunger is better controlled. He is working on increasing his seafood.   Subjective:   1. Essential hypertension Keith Brown's blood pressure is elevated today. He requests a refill of losartan.   2. Vitamin D deficiency Keith Brown is on Vitamin D 2,000 IU daily. His last Vitamin D level was at goal.  Assessment/Plan:   1. Essential hypertension We will refill losartan, and [name] is to discuss his elevated blood pressure with his PCP.  - losartan (COZAAR) 100 MG tablet; Take 1 tablet (100 mg total) by mouth daily.  Dispense: 90 tablet; Refill: 3  2. Vitamin D deficiency Keith Brown will continue his Vitamin D OTC 2,000 IU daily, and we will recheck labs in 1-2 months.   3. Obesity, Current BMI 44.9 Keith Brown is currently in the action stage of change. As such, his goal is to continue with weight loss efforts. He has agreed to the Category 3 Plan.   Behavioral modification strategies: increasing lean protein intake and increasing water intake.  Keith Brown has agreed to follow-up with our clinic in 3 to 4 weeks. He was informed of the importance of frequent follow-up visits to maximize his success with intensive lifestyle modifications for his multiple health conditions.   Objective:   Blood pressure (!) 163/73, pulse 74, temperature 98.4 F (36.9 C), height '5\' 8"'$  (1.727 m), weight  295 lb (133.8 kg), SpO2 98 %. Body mass index is 44.85 kg/m.  General: Cooperative, alert, well developed, in no acute distress. HEENT: Conjunctivae and lids unremarkable. Cardiovascular: Regular rhythm.  Lungs: Normal work of breathing. Neurologic: No focal deficits.   Lab Results  Component Value Date   CREATININE 1.01 02/02/2021   BUN 16 02/02/2021   NA 141 02/02/2021   K 4.6 02/02/2021   CL 106 02/02/2021   CO2 19 (L) 02/02/2021   Lab Results  Component Value Date   ALT 43 02/02/2021   AST 37 02/02/2021   ALKPHOS 77 02/02/2021   BILITOT 0.7 02/02/2021   Lab Results  Component Value Date   HGBA1C 5.5 01/03/2021   HGBA1C 5.5 03/26/2019   HGBA1C 5.3 06/05/2018   HGBA1C 5.6 01/23/2017   Lab Results  Component Value Date   INSULIN 20.6 02/02/2021   Lab Results  Component Value Date   TSH 2.63 01/03/2021   Lab Results  Component Value Date   CHOL 196 01/03/2021   HDL 58.20 01/03/2021   LDLCALC 119 (H) 01/03/2021   TRIG 93.0 01/03/2021   CHOLHDL 3 01/03/2021   Lab Results  Component Value Date   VD25OH 61.59 01/03/2021   VD25OH 59 03/31/2020   VD25OH 40 03/26/2019   Lab Results  Component Value Date   WBC 8.4 01/03/2021   HGB 16.1 01/03/2021   HCT 48.0 01/03/2021   MCV 89.7 01/03/2021   PLT 145.0 (L) 01/03/2021   Lab Results  Component Value Date   IRON 108 03/31/2020   TIBC 307 03/31/2020   FERRITIN 451 (H) 03/31/2020   Attestation Statements:   Reviewed by clinician on day of visit: allergies, medications, problem list, medical history, surgical history, family history, social history, and previous encounter notes.   I, Trixie Dredge, am acting as transcriptionist for Dennard Nip, MD.  I have reviewed the above documentation for accuracy and completeness, and I agree with the above. -  Dennard Nip, MD

## 2021-06-21 NOTE — Progress Notes (Signed)
Chief Complaint:   OBESITY Keith Brown is here to discuss his progress with his obesity treatment plan along with follow-up of his obesity related diagnoses. Keith Brown is on the Category 3 Plan and states he is following his eating plan approximately 60-70% of the time. Keith Brown states he is doing 0 minutes 0 times per week.  Today's visit was #: 9 Starting weight: 317 lbs Starting date: 02/02/2021 Today's weight: 290 lbs Today's date: 06/14/2021 Total lbs lost to date: 27 Total lbs lost since last in-office visit: 5  Interim History: Keith Brown continues to do well with weight loss. His hunger is mostly controlled. He notes some PM cravings, especially for crunchy food.   Subjective:   1. Other hyperlipidemia Keith Brown is working on decreasing cholesterol in his diet. He is not on a statin, but he takes fish oil OTC.   2. Vitamin D deficiency Keith Brown is on OTC Vitamin D 10,000 IU daily. He is due for labs soon. He denies nausea, vomiting, or muscle weakness.   Assessment/Plan:   1. Other hyperlipidemia Cardiovascular risk and specific lipid/LDL goals reviewed.  We discussed several lifestyle modifications today. We will recheck labs in 6 weeks. Keith Brown will continue to work on diet, exercise and weight loss efforts. Orders and follow up as documented in patient record.   2. Vitamin D deficiency Low Vitamin D level contributes to fatigue and are associated with obesity, breast, and colon cancer. He will continue OTC Vitamin D. We will recheck labs in 6 weeks. Keith Brown will follow-up for routine testing of Vitamin D, at least 2-3 times per year to avoid over-replacement.  3. Obesity, Current BMI 44.2 Keith Brown is currently in the action stage of change. As such, his goal is to continue with weight loss efforts. He has agreed to the Category 3 Plan.   We will recheck fasting labs at his next visit.  Exercise goals: All adults should avoid inactivity. Some physical activity is better than none, and adults who  participate in any amount of physical activity gain some health benefits.  Behavioral modification strategies: increasing lean protein intake.  Keith Brown has agreed to follow-up with our clinic in 6 weeks. He was informed of the importance of frequent follow-up visits to maximize his success with intensive lifestyle modifications for his multiple health conditions.   Objective:   Blood pressure (!) 147/75, pulse 71, temperature 97.9 F (36.6 C), height '5\' 8"'$  (1.727 m), weight 290 lb (131.5 kg), SpO2 97 %. Body mass index is 44.09 kg/m.  General: Cooperative, alert, well developed, in no acute distress. HEENT: Conjunctivae and lids unremarkable. Cardiovascular: Regular rhythm.  Lungs: Normal work of breathing. Neurologic: No focal deficits.   Lab Results  Component Value Date   CREATININE 1.01 02/02/2021   BUN 16 02/02/2021   NA 141 02/02/2021   K 4.6 02/02/2021   CL 106 02/02/2021   CO2 19 (L) 02/02/2021   Lab Results  Component Value Date   ALT 43 02/02/2021   AST 37 02/02/2021   ALKPHOS 77 02/02/2021   BILITOT 0.7 02/02/2021   Lab Results  Component Value Date   HGBA1C 5.5 01/03/2021   HGBA1C 5.5 03/26/2019   HGBA1C 5.3 06/05/2018   HGBA1C 5.6 01/23/2017   Lab Results  Component Value Date   INSULIN 20.6 02/02/2021   Lab Results  Component Value Date   TSH 2.63 01/03/2021   Lab Results  Component Value Date   CHOL 196 01/03/2021   HDL 58.20 01/03/2021  LDLCALC 119 (H) 01/03/2021   TRIG 93.0 01/03/2021   CHOLHDL 3 01/03/2021   Lab Results  Component Value Date   VD25OH 61.59 01/03/2021   VD25OH 59 03/31/2020   VD25OH 40 03/26/2019   Lab Results  Component Value Date   WBC 8.4 01/03/2021   HGB 16.1 01/03/2021   HCT 48.0 01/03/2021   MCV 89.7 01/03/2021   PLT 145.0 (L) 01/03/2021   Lab Results  Component Value Date   IRON 108 03/31/2020   TIBC 307 03/31/2020   FERRITIN 451 (H) 03/31/2020   Attestation Statements:   Reviewed by clinician on  day of visit: allergies, medications, problem list, medical history, surgical history, family history, social history, and previous encounter notes.  Time spent on visit including pre-visit chart review and post-visit care and charting was 42 minutes.   I, Trixie Dredge, am acting as transcriptionist for Dennard Nip, MD.  I have reviewed the above documentation for accuracy and completeness, and I agree with the above. -  Dennard Nip, MD

## 2021-06-28 ENCOUNTER — Ambulatory Visit (INDEPENDENT_AMBULATORY_CARE_PROVIDER_SITE_OTHER): Payer: No Typology Code available for payment source | Admitting: Family Medicine

## 2021-06-28 ENCOUNTER — Encounter (INDEPENDENT_AMBULATORY_CARE_PROVIDER_SITE_OTHER): Payer: Self-pay | Admitting: Family Medicine

## 2021-06-28 VITALS — BP 158/76 | HR 93 | Temp 98.3°F | Ht 68.0 in | Wt 289.0 lb

## 2021-06-28 DIAGNOSIS — E790 Hyperuricemia without signs of inflammatory arthritis and tophaceous disease: Secondary | ICD-10-CM | POA: Diagnosis not present

## 2021-06-28 DIAGNOSIS — E559 Vitamin D deficiency, unspecified: Secondary | ICD-10-CM

## 2021-06-28 DIAGNOSIS — Z6841 Body Mass Index (BMI) 40.0 and over, adult: Secondary | ICD-10-CM

## 2021-06-28 DIAGNOSIS — E669 Obesity, unspecified: Secondary | ICD-10-CM | POA: Diagnosis not present

## 2021-06-29 NOTE — Progress Notes (Signed)
Chief Complaint:   OBESITY Keith Brown is here to discuss his progress with his obesity treatment plan along with follow-up of his obesity related diagnoses. Keith Brown is on the Category 3 Plan and states he is following his eating plan approximately 47% of the time. Keith Brown states he is doing 0 minutes 0 times per week.  Today's visit was #: 10 Starting weight: 317 lbs Starting date: 02/02/2021 Today's weight: 289 lbs Today's date: 06/28/2021 Total lbs lost to date: 28 Total lbs lost since last in-office visit: 1  Interim History: Keith Brown was on vacation at [employer]. He was able to lose weight by working on increasing lean protein and vegetables, and portion control his desserts.    Subjective:   1. Vitamin D deficiency Keith Brown is on vitamin D OTC, and he is due for labs soon.  2. Hyperuricemia Keith Brown is on allopurinol, and he is due for labs soon.  Assessment/Plan:   1. Vitamin D deficiency Keith Brown will continue OTC vitamin D 2000 units daily.  We will recheck labs at his next visit.  2. Hyperuricemia We will recheck labs in 1 month at [facility] next visit.  3. Obesity, Current BMI 44.0 Keith Brown is currently in the action stage of change. As such, his goal is to continue with weight loss efforts. He has agreed to the Category 3 Plan.   Exercise goals: Walk on the treadmill for 10 minutes 5 times per week.  Behavioral modification strategies: no skipping meals and meal planning and cooking strategies.  Keith Brown has agreed to follow-up with our clinic in 2 to 3 weeks. He was informed of the importance of frequent follow-up visits to maximize his success with intensive lifestyle modifications for his multiple health conditions.   Objective:   Blood pressure (!) 158/76, pulse 93, temperature 98.3 F (36.8 C), height '5\' 8"'$  (1.727 m), weight 289 lb (131.1 kg), SpO2 96 %. Body mass index is 43.94 kg/m.  General: Cooperative, alert, well developed, in no acute distress. HEENT: Conjunctivae and  lids unremarkable. Cardiovascular: Regular rhythm.  Lungs: Normal work of breathing. Neurologic: No focal deficits.   Lab Results  Component Value Date   CREATININE 1.01 02/02/2021   BUN 16 02/02/2021   NA 141 02/02/2021   K 4.6 02/02/2021   CL 106 02/02/2021   CO2 19 (L) 02/02/2021   Lab Results  Component Value Date   ALT 43 02/02/2021   AST 37 02/02/2021   ALKPHOS 77 02/02/2021   BILITOT 0.7 02/02/2021   Lab Results  Component Value Date   HGBA1C 5.5 01/03/2021   HGBA1C 5.5 03/26/2019   HGBA1C 5.3 06/05/2018   HGBA1C 5.6 01/23/2017   Lab Results  Component Value Date   INSULIN 20.6 02/02/2021   Lab Results  Component Value Date   TSH 2.63 01/03/2021   Lab Results  Component Value Date   CHOL 196 01/03/2021   HDL 58.20 01/03/2021   LDLCALC 119 (H) 01/03/2021   TRIG 93.0 01/03/2021   CHOLHDL 3 01/03/2021   Lab Results  Component Value Date   VD25OH 61.59 01/03/2021   VD25OH 59 03/31/2020   VD25OH 40 03/26/2019   Lab Results  Component Value Date   WBC 8.4 01/03/2021   HGB 16.1 01/03/2021   HCT 48.0 01/03/2021   MCV 89.7 01/03/2021   PLT 145.0 (L) 01/03/2021   Lab Results  Component Value Date   IRON 108 03/31/2020   TIBC 307 03/31/2020   FERRITIN 451 (H) 03/31/2020  Attestation Statements:   Reviewed by clinician on day of visit: allergies, medications, problem list, medical history, surgical history, family history, social history, and previous encounter notes.   I, Trixie Dredge, am acting as transcriptionist for Dennard Nip, MD.  I have reviewed the above documentation for accuracy and completeness, and I agree with the above. -  Dennard Nip, MD

## 2021-07-13 ENCOUNTER — Other Ambulatory Visit (HOSPITAL_COMMUNITY): Payer: Self-pay

## 2021-07-13 ENCOUNTER — Other Ambulatory Visit (INDEPENDENT_AMBULATORY_CARE_PROVIDER_SITE_OTHER): Payer: Self-pay | Admitting: Family Medicine

## 2021-07-13 DIAGNOSIS — E790 Hyperuricemia without signs of inflammatory arthritis and tophaceous disease: Secondary | ICD-10-CM

## 2021-07-21 ENCOUNTER — Other Ambulatory Visit (INDEPENDENT_AMBULATORY_CARE_PROVIDER_SITE_OTHER): Payer: Self-pay | Admitting: Family Medicine

## 2021-07-21 ENCOUNTER — Other Ambulatory Visit (HOSPITAL_COMMUNITY): Payer: Self-pay

## 2021-07-21 DIAGNOSIS — E790 Hyperuricemia without signs of inflammatory arthritis and tophaceous disease: Secondary | ICD-10-CM

## 2021-07-21 NOTE — Telephone Encounter (Signed)
last filled by [employer]

## 2021-07-22 ENCOUNTER — Other Ambulatory Visit (HOSPITAL_COMMUNITY): Payer: Self-pay

## 2021-07-22 MED ORDER — ALLOPURINOL 300 MG PO TABS
300.0000 mg | ORAL_TABLET | Freq: Every day | ORAL | 3 refills | Status: DC
Start: 2021-07-22 — End: 2022-10-23
  Filled 2021-07-22: qty 90, 90d supply, fill #0
  Filled 2021-11-30: qty 90, 90d supply, fill #1
  Filled 2022-03-14: qty 90, 90d supply, fill #2
  Filled 2022-07-03: qty 90, 90d supply, fill #3

## 2021-07-25 ENCOUNTER — Encounter (INDEPENDENT_AMBULATORY_CARE_PROVIDER_SITE_OTHER): Payer: Self-pay | Admitting: Family Medicine

## 2021-07-25 ENCOUNTER — Ambulatory Visit (INDEPENDENT_AMBULATORY_CARE_PROVIDER_SITE_OTHER): Payer: No Typology Code available for payment source | Admitting: Family Medicine

## 2021-07-25 VITALS — BP 149/71 | HR 71 | Temp 98.3°F | Ht 68.0 in | Wt 285.0 lb

## 2021-07-25 DIAGNOSIS — Z6841 Body Mass Index (BMI) 40.0 and over, adult: Secondary | ICD-10-CM

## 2021-07-25 DIAGNOSIS — E8881 Metabolic syndrome: Secondary | ICD-10-CM

## 2021-07-25 DIAGNOSIS — M109 Gout, unspecified: Secondary | ICD-10-CM | POA: Diagnosis not present

## 2021-07-25 DIAGNOSIS — E669 Obesity, unspecified: Secondary | ICD-10-CM

## 2021-07-25 DIAGNOSIS — E559 Vitamin D deficiency, unspecified: Secondary | ICD-10-CM | POA: Diagnosis not present

## 2021-07-25 DIAGNOSIS — E7800 Pure hypercholesterolemia, unspecified: Secondary | ICD-10-CM | POA: Insufficient documentation

## 2021-07-25 DIAGNOSIS — E7849 Other hyperlipidemia: Secondary | ICD-10-CM | POA: Diagnosis not present

## 2021-07-25 DIAGNOSIS — E88819 Insulin resistance, unspecified: Secondary | ICD-10-CM | POA: Insufficient documentation

## 2021-07-25 NOTE — Progress Notes (Unsigned)
Chief Complaint:   OBESITY Keith Brown is here to discuss his progress with his obesity treatment plan along with follow-up of his obesity related diagnoses. Keith Brown is on the Category 3 Plan and states he is following his eating plan approximately 50% of the time. Keith Brown states he is doing 0 minutes 0 times per week.  Today's visit was #: 11 Starting weight: 317 lbs Starting date: 02/02/2021 Today's weight: 285 lbs Today's date: 07/25/2021 Total lbs lost to date: 32 Total lbs lost since last in-office visit: 4  Interim History: Amedee continues to do very well with weight loss despite some celebration eating situations.  He is not doing formal exercise but he is staying active.  Subjective:   1. Insulin resistance Keith Brown is working on his diet and weight loss, and he is due for labs.  2. Vitamin D deficiency Keith Brown is stable on vitamin D, his last level was almost at goal.  3. Hyperlipidemia, pure Nora' last LDL was elevated.  He denies chest pain, and he is not on statin.  He is working to decrease his cholesterol in his diet.  4. Gout, unspecified cause, unspecified chronicity, unspecified site Keith Brown is off Colchicine for a few days and he requests his uric acid.   Assessment/Plan:   1. Insulin resistance We will check labs today.  Keith Brown will continue to work on his diet and exercise.  We will follow-up at his next visit.  - CMP14+EGFR - Insulin, random - Hemoglobin A1c  2. Vitamin D deficiency We will check labs today. Keith Brown will continue Vitamin D3 2,000 IU daily.   - Vitamin B12 - VITAMIN D 25 Hydroxy (Vit-D Deficiency, Fractures)  3. Hyperlipidemia, pure We will check labs today, and we will follow-up at Kenly' next visit.  - Lipid Panel With LDL/HDL Ratio - CBC with Differential/Platelet  4. Gout, unspecified cause, unspecified chronicity, unspecified site We will check labs today, and we will follow-up at Rolly' next visit.  - Uric acid  5. Obesity, Current  BMI 43.4 Keith Brown is currently in the action stage of change. As such, his goal is to continue with weight loss efforts. He has agreed to the Category 3 Plan.   Behavioral modification strategies: increasing lean protein intake.  Keith Brown has agreed to follow-up with our clinic in 3 weeks. He was informed of the importance of frequent follow-up visits to maximize his success with intensive lifestyle modifications for his multiple health conditions.   Keith Brown was informed we would discuss his lab results at his next visit unless there is a critical issue that needs to be addressed sooner. Keith Brown agreed to keep his next visit at the agreed upon time to discuss these results.  Objective:   Blood pressure (!) 149/71, pulse 71, temperature 98.3 F (36.8 C), height _0  (1.727 m), weight 285 lb (129.3 kg), SpO2 94 %. Body mass index is 43.33 kg/m.  General: Cooperative, alert, well developed, in no acute distress. HEENT: Conjunctivae and lids unremarkable. Cardiovascular: Regular rhythm.  Lungs: Normal work of breathing. Neurologic: No focal deficits.   Lab Results  Component Value Date   CREATININE 1.01 02/02/2021   BUN 16 02/02/2021   NA 141 02/02/2021   K 4.6 02/02/2021   CL 106 02/02/2021   CO2 19 (L) 02/02/2021   Lab Results  Component Value Date   ALT 43 02/02/2021   AST 37 02/02/2021   ALKPHOS 77 02/02/2021   BILITOT 0.7 02/02/2021   Lab Results  Component Value  Date   HGBA1C 5.5 01/03/2021   HGBA1C 5.5 03/26/2019   HGBA1C 5.3 06/05/2018   HGBA1C 5.6 01/23/2017   Lab Results  Component Value Date   INSULIN 20.6 02/02/2021   Lab Results  Component Value Date   TSH 2.63 01/03/2021   Lab Results  Component Value Date   CHOL 196 01/03/2021   HDL 58.20 01/03/2021   LDLCALC 119 (H) 01/03/2021   TRIG 93.0 01/03/2021   CHOLHDL 3 01/03/2021   Lab Results  Component Value Date   VD25OH 61.59 01/03/2021   VD25OH 59 03/31/2020   VD25OH 40 03/26/2019   Lab Results   Component Value Date   WBC 8.4 01/03/2021   HGB 16.1 01/03/2021   HCT 48.0 01/03/2021   MCV 89.7 01/03/2021   PLT 145.0 (L) 01/03/2021   Lab Results  Component Value Date   IRON 108 03/31/2020   TIBC 307 03/31/2020   FERRITIN 451 (H) 03/31/2020   Attestation Statements:   Reviewed by clinician on day of visit: allergies, medications, problem list, medical history, surgical history, family history, social history, and previous encounter notes.   I, Keith Brown, am acting as transcriptionist for Dennard Nip, MD.  I have reviewed the above documentation for accuracy and completeness, and I agree with the above. -  Dennard Nip, MD

## 2021-07-26 LAB — LIPID PANEL WITH LDL/HDL RATIO
Cholesterol, Total: 178 mg/dL (ref 100–199)
HDL: 56 mg/dL (ref >39)
LDL Chol Calc (NIH): 105 mg/dL — ABNORMAL HIGH (ref 0–99)
LDL/HDL Ratio: 1.9 ratio (ref 0.0–3.6)
Triglycerides: 91 mg/dL (ref 0–149)
VLDL Cholesterol Cal: 17 mg/dL (ref 5–40)

## 2021-07-26 LAB — VITAMIN D 25 HYDROXY (VIT D DEFICIENCY, FRACTURES): Vit D, 25-Hydroxy: 59.5 ng/mL (ref 30.0–100.0)

## 2021-07-26 LAB — CMP14+EGFR
ALT: 18 IU/L (ref 0–44)
AST: 18 IU/L (ref 0–40)
Albumin/Globulin Ratio: 1.8 (ref 1.2–2.2)
Albumin: 4.6 g/dL (ref 3.9–4.9)
Alkaline Phosphatase: 77 IU/L (ref 44–121)
BUN/Creatinine Ratio: 19 (ref 10–24)
BUN: 20 mg/dL (ref 8–27)
Bilirubin Total: 0.6 mg/dL (ref 0.0–1.2)
CO2: 20 mmol/L (ref 20–29)
Calcium: 9.8 mg/dL (ref 8.6–10.2)
Chloride: 105 mmol/L (ref 96–106)
Creatinine, Ser: 1.04 mg/dL (ref 0.76–1.27)
Globulin, Total: 2.6 g/dL (ref 1.5–4.5)
Glucose: 101 mg/dL — ABNORMAL HIGH (ref 70–99)
Potassium: 4.1 mmol/L (ref 3.5–5.2)
Sodium: 142 mmol/L (ref 134–144)
Total Protein: 7.2 g/dL (ref 6.0–8.5)
eGFR: 80 mL/min/{1.73_m2} (ref >59)

## 2021-07-26 LAB — CBC WITH DIFFERENTIAL/PLATELET
Basophils Absolute: 0 10*3/uL (ref 0.0–0.2)
Basos: 0 %
EOS (ABSOLUTE): 0.1 10*3/uL (ref 0.0–0.4)
Eos: 1 %
Hematocrit: 46.8 % (ref 37.5–51.0)
Hemoglobin: 15.7 g/dL (ref 13.0–17.7)
Immature Grans (Abs): 0 10*3/uL (ref 0.0–0.1)
Immature Granulocytes: 0 %
Lymphocytes Absolute: 1.4 10*3/uL (ref 0.7–3.1)
Lymphs: 16 %
MCH: 29.9 pg (ref 26.6–33.0)
MCHC: 33.5 g/dL (ref 31.5–35.7)
MCV: 89 fL (ref 79–97)
Monocytes Absolute: 0.6 10*3/uL (ref 0.1–0.9)
Monocytes: 7 %
Neutrophils Absolute: 6.8 10*3/uL (ref 1.4–7.0)
Neutrophils: 76 %
Platelets: 156 10*3/uL (ref 150–450)
RBC: 5.25 x10E6/uL (ref 4.14–5.80)
RDW: 13.1 % (ref 11.6–15.4)
WBC: 8.8 10*3/uL (ref 3.4–10.8)

## 2021-07-26 LAB — HEMOGLOBIN A1C
Est. average glucose Bld gHb Est-mCnc: 108 mg/dL
Hgb A1c MFr Bld: 5.4 % (ref 4.8–5.6)

## 2021-07-26 LAB — INSULIN, RANDOM: INSULIN: 19.4 u[IU]/mL (ref 2.6–24.9)

## 2021-07-26 LAB — VITAMIN B12: Vitamin B-12: 472 pg/mL (ref 232–1245)

## 2021-07-26 LAB — URIC ACID: Uric Acid: 6.8 mg/dL (ref 3.8–8.4)

## 2021-08-17 ENCOUNTER — Ambulatory Visit (INDEPENDENT_AMBULATORY_CARE_PROVIDER_SITE_OTHER): Payer: No Typology Code available for payment source | Admitting: Family Medicine

## 2021-08-17 ENCOUNTER — Encounter (INDEPENDENT_AMBULATORY_CARE_PROVIDER_SITE_OTHER): Payer: Self-pay | Admitting: Family Medicine

## 2021-08-17 ENCOUNTER — Encounter (INDEPENDENT_AMBULATORY_CARE_PROVIDER_SITE_OTHER): Payer: Self-pay

## 2021-08-17 VITALS — BP 172/75 | HR 57 | Temp 98.2°F | Ht 68.0 in | Wt 289.0 lb

## 2021-08-17 DIAGNOSIS — E669 Obesity, unspecified: Secondary | ICD-10-CM | POA: Diagnosis not present

## 2021-08-17 DIAGNOSIS — Z6841 Body Mass Index (BMI) 40.0 and over, adult: Secondary | ICD-10-CM | POA: Diagnosis not present

## 2021-08-17 DIAGNOSIS — E559 Vitamin D deficiency, unspecified: Secondary | ICD-10-CM | POA: Diagnosis not present

## 2021-08-17 DIAGNOSIS — E66813 Obesity, class 3: Secondary | ICD-10-CM

## 2021-08-17 DIAGNOSIS — I10 Essential (primary) hypertension: Secondary | ICD-10-CM

## 2021-08-24 ENCOUNTER — Encounter (INDEPENDENT_AMBULATORY_CARE_PROVIDER_SITE_OTHER): Payer: Self-pay

## 2021-08-28 NOTE — Progress Notes (Unsigned)
Chief Complaint:   OBESITY Keith Brown is here to discuss his progress with his obesity treatment plan along with follow-up of his obesity related diagnoses. Keith Brown is on the Category 3 Plan and states he is following his eating plan approximately 40% of the time. Keith Brown states he is walking and taking dance lessons 4-5 times per week.    Today's visit was #: 12 Starting weight: 317 lbs Starting date: 02/02/2021 Today's weight: 289 lbs Today's date: 08/17/2021 Total lbs lost to date: 28 Total lbs lost since last in-office visit: 0  Interim History: Keith Brown is retaining some fluid today.  He is doing dance lessons as part of his exercise.  He is trying to increase his walking but this is difficult due to the heat.  He recently got engaged and he did a fair amount of celebration eating.  Subjective:   1. Essential hypertension Keith Brown blood pressure is elevated today.  He is seeing his PCP soon and will likely need his medication adjusted.  He is retaining some water weight today as well.  2. Vitamin D deficiency Keith Brown vitamin D level was reviewed and is at goal.  He denies nausea, vomiting, or muscle weakness.  I discussed labs with the patient today.  Assessment/Plan:   1. Essential hypertension Keith Brown will continue to decrease sodium and work on weight loss.  We will continue to monitor.  2. Vitamin D deficiency Keith Brown will continue vitamin D and we will continue to monitor.  He is at high risk of over replacement with continued weight loss.  3. Obesity, Current BMI 44.0 Keith Brown is currently in the action stage of change. As such, his goal is to continue with weight loss efforts. He has agreed to the Category 3 Plan.   Exercise goals: As is.   Behavioral modification strategies: increasing lean protein intake and decreasing liquid calories.  Keith Brown has agreed to follow-up with our clinic in 4 weeks. He was informed of the importance of frequent follow-up visits to maximize his success with  intensive lifestyle modifications for his multiple health conditions.   Objective:   Blood pressure (!) 172/75, pulse (!) 57, temperature 98.2 F (36.8 C), height '5\' 8"'$  (1.727 m), weight 289 lb (131.1 kg), SpO2 96 %. Body mass index is 43.94 kg/m.  General: Cooperative, alert, well developed, in no acute distress. HEENT: Conjunctivae and lids unremarkable. Cardiovascular: Regular rhythm.  Lungs: Normal work of breathing. Neurologic: No focal deficits.   Lab Results  Component Value Date   CREATININE 1.04 07/25/2021   BUN 20 07/25/2021   NA 142 07/25/2021   K 4.1 07/25/2021   CL 105 07/25/2021   CO2 20 07/25/2021   Lab Results  Component Value Date   ALT 18 07/25/2021   AST 18 07/25/2021   ALKPHOS 77 07/25/2021   BILITOT 0.6 07/25/2021   Lab Results  Component Value Date   HGBA1C 5.4 07/25/2021   HGBA1C 5.5 01/03/2021   HGBA1C 5.5 03/26/2019   HGBA1C 5.3 06/05/2018   HGBA1C 5.6 01/23/2017   Lab Results  Component Value Date   INSULIN 19.4 07/25/2021   INSULIN 20.6 02/02/2021   Lab Results  Component Value Date   TSH 2.63 01/03/2021   Lab Results  Component Value Date   CHOL 178 07/25/2021   HDL 56 07/25/2021   LDLCALC 105 (H) 07/25/2021   TRIG 91 07/25/2021   CHOLHDL 3 01/03/2021   Lab Results  Component Value Date   VD25OH 59.5 07/25/2021  VD25OH 61.59 01/03/2021   VD25OH 59 03/31/2020   Lab Results  Component Value Date   WBC 8.8 07/25/2021   HGB 15.7 07/25/2021   HCT 46.8 07/25/2021   MCV 89 07/25/2021   PLT 156 07/25/2021   Lab Results  Component Value Date   IRON 108 03/31/2020   TIBC 307 03/31/2020   FERRITIN 451 (H) 03/31/2020   Attestation Statements:   Reviewed by clinician on day of visit: allergies, medications, problem list, medical history, surgical history, family history, social history, and previous encounter notes.  Time spent on visit including pre-visit chart review and post-visit care and charting was 34 minutes.    I, Trixie Dredge, am acting as transcriptionist for Dennard Nip, MD.  I have reviewed the above documentation for accuracy and completeness, and I agree with the above. -  Dennard Nip, MD

## 2021-08-29 ENCOUNTER — Telehealth: Payer: Self-pay | Admitting: Family Medicine

## 2021-08-29 ENCOUNTER — Encounter: Payer: Self-pay | Admitting: Family Medicine

## 2021-08-29 ENCOUNTER — Ambulatory Visit (INDEPENDENT_AMBULATORY_CARE_PROVIDER_SITE_OTHER): Payer: No Typology Code available for payment source | Admitting: Family Medicine

## 2021-08-29 VITALS — BP 135/76 | HR 61 | Temp 98.2°F | Ht 68.0 in | Wt 288.4 lb

## 2021-08-29 DIAGNOSIS — Z23 Encounter for immunization: Secondary | ICD-10-CM

## 2021-08-29 DIAGNOSIS — E785 Hyperlipidemia, unspecified: Secondary | ICD-10-CM

## 2021-08-29 DIAGNOSIS — N400 Enlarged prostate without lower urinary tract symptoms: Secondary | ICD-10-CM

## 2021-08-29 DIAGNOSIS — I10 Essential (primary) hypertension: Secondary | ICD-10-CM

## 2021-08-29 DIAGNOSIS — Z0001 Encounter for general adult medical examination with abnormal findings: Secondary | ICD-10-CM | POA: Diagnosis not present

## 2021-08-29 DIAGNOSIS — L578 Other skin changes due to chronic exposure to nonionizing radiation: Secondary | ICD-10-CM

## 2021-08-29 DIAGNOSIS — E559 Vitamin D deficiency, unspecified: Secondary | ICD-10-CM

## 2021-08-29 DIAGNOSIS — Z639 Problem related to primary support group, unspecified: Secondary | ICD-10-CM

## 2021-08-29 DIAGNOSIS — N529 Male erectile dysfunction, unspecified: Secondary | ICD-10-CM | POA: Insufficient documentation

## 2021-08-29 DIAGNOSIS — M109 Gout, unspecified: Secondary | ICD-10-CM

## 2021-08-29 MED ORDER — TADALAFIL 10 MG PO TABS
10.0000 mg | ORAL_TABLET | ORAL | 3 refills | Status: DC | PRN
  Filled 2021-08-30: qty 27, 54d supply, fill #0

## 2021-08-29 NOTE — Assessment & Plan Note (Signed)
Stable on cialis but feels like the dose could be stronger. Will increase to '10mg'$  daily.

## 2021-08-29 NOTE — Assessment & Plan Note (Signed)
No recent flares.  Continue allopurinol 300 mg daily. 

## 2021-08-29 NOTE — Addendum Note (Signed)
Addended by: Betti Cruz on: 08/29/2021 09:59 AM   Modules accepted: Orders

## 2021-08-29 NOTE — Assessment & Plan Note (Signed)
Will increase cialis to '10mg'$  daily.

## 2021-08-29 NOTE — Patient Instructions (Signed)
It was very nice to see you today!  I will send in Cialis to [facility].  I will refer you to the dermatologist and therapist.  We gave your tetanus and shingles vaccine today.  I will see back in year for your next physical.  Please come back to see [place] sooner if needed.  Take care, Dr Jerline Pain  PLEASE NOTE:  If you had any lab tests please let us know if you have not heard back within a few days. You may see your results on mychart before we have a chance to review them but we will give you a call once they are reviewed by [place]. If we ordered any referrals today, please let us know if you have not heard from their office within the next week.   Please try these tips to maintain a healthy lifestyle:  Eat at least 3 REAL meals and 1-2 snacks per day.  Aim for no more than 5 hours between eating.  If you eat breakfast, please do so within one hour of getting up.   Each meal should contain half fruits/vegetables, one quarter protein, and one quarter carbs (no bigger than a computer mouse)  Cut down on sweet beverages. This includes juice, soda, and sweet tea.   Drink at least 1 glass of water with each meal and aim for at least 8 glasses per day  Exercise at least 150 minutes every week.    Preventive Care 35 Years and Older, Male Preventive care refers to lifestyle choices and visits with your health care provider that can promote health and wellness. Preventive care visits are also called wellness exams. What can I expect for my preventive care visit? Counseling During your preventive care visit, your health care provider may ask about your: Medical history, including: Past medical problems. Family medical history. History of falls. Current health, including: Emotional well-being. Home life and relationship well-being. Sexual activity. Memory and ability to understand (cognition). Lifestyle, including: Alcohol, nicotine or tobacco, and drug use. Access to firearms. Diet,  exercise, and sleep habits. Work and work Statistician. Sunscreen use. Safety issues such as seatbelt and bike helmet use. Physical exam Your health care provider will check your: Height and weight. These may be used to calculate your BMI (body mass index). BMI is a measurement that tells if you are at a healthy weight. Waist circumference. This measures the distance around your waistline. This measurement also tells if you are at a healthy weight and may help predict your risk of certain diseases, such as type 2 diabetes and high blood pressure. Heart rate and blood pressure. Body temperature. Skin for abnormal spots. What immunizations do I need?  Vaccines are usually given at various ages, according to a schedule. Your health care provider will recommend vaccines for you based on your age, medical history, and lifestyle or other factors, such as travel or where you work. What tests do I need? Screening Your health care provider may recommend screening tests for certain conditions. This may include: Lipid and cholesterol levels. Diabetes screening. This is done by checking your blood sugar (glucose) after you have not eaten for a while (fasting). Hepatitis C test. Hepatitis B test. HIV (human immunodeficiency virus) test. STI (sexually transmitted infection) testing, if you are at risk. Lung cancer screening. Colorectal cancer screening. Prostate cancer screening. Abdominal aortic aneurysm (AAA) screening. You may need this if you are a current or former smoker. Talk with your health care provider about your test results, treatment options,  and if necessary, the need for more tests. Follow these instructions at home: Eating and drinking  Eat a diet that includes fresh fruits and vegetables, whole grains, lean protein, and low-fat dairy products. Limit your intake of foods with high amounts of sugar, saturated fats, and salt. Take vitamin and mineral supplements as recommended by your  health care provider. Do not drink alcohol if your health care provider tells you not to drink. If you drink alcohol: Limit how much you have to 0-2 drinks a day. Know how much alcohol is in your drink. In the [place] one drink equals one 12 oz bottle of beer (355 mL), one 5 oz glass of wine (148 mL), or one 1 oz glass of hard liquor (44 mL). Lifestyle Brush your teeth every morning and night with fluoride toothpaste. Floss one time each day. Exercise for at least 30 minutes 5 or more days each week. Do not use any products that contain nicotine or tobacco. These products include cigarettes, chewing tobacco, and vaping devices, such as e-cigarettes. If you need help quitting, ask your health care provider. Do not use drugs. If you are sexually active, practice safe sex. Use a condom or other form of protection to prevent STIs. Take aspirin only as told by your health care provider. Make sure that you understand how much to take and what form to take. Work with your health care provider to find out whether it is safe and beneficial for you to take aspirin daily. Ask your health care provider if you need to take a cholesterol-lowering medicine (statin). Find healthy ways to manage stress, such as: Meditation, yoga, or listening to music. Journaling. Talking to a trusted person. Spending time with friends and family. Safety Always wear your seat belt while driving or riding in a vehicle. Do not drive: If you have been drinking alcohol. Do not ride with someone who has been drinking. When you are tired or distracted. While texting. If you have been using any mind-altering substances or drugs. Wear a helmet and other protective equipment during sports activities. If you have firearms in your house, make sure you follow all gun safety procedures. Minimize exposure to UV radiation to reduce your risk of skin cancer. What's next? Visit your health care provider once a year for an annual wellness  visit. Ask your health care provider how often you should have your eyes and teeth checked. Stay up to date on all vaccines. This information is not intended to replace advice given to you by your health care provider. Make sure you discuss any questions you have with your health care provider. Document Revised: 06/30/2020 Document Reviewed: 06/30/2020 Elsevier Patient Education  [employer].

## 2021-08-29 NOTE — Progress Notes (Signed)
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    Assessment/Plan:  Chronic Problems Addressed Today: Erectile dysfunction Stable on cialis but feels like the dose could be stronger. Will increase to '10mg'$  daily.   Gout No recent flares. Continue allopurinol '300mg'$  daily.   Dyslipidemia Last LDL 105. Continue lifestyle modifications.   Essential hypertension At goal today. Continue losartan '100mg'$  daily and HCTZ '25mg'$  daily.   BPH (benign prostatic hyperplasia) Will increase cialis to '10mg'$  daily.   Sun-damaged skin We will refer to dermatology.  Preventative Healthcare: Shingles and tetanus given today.  Had labs done a month ago-do not need to repeat today.  Up-to-date on colon cancer screening.  Patient Counseling(The following topics were reviewed and/or handout was given):  -Nutrition: Stressed importance of moderation in sodium/caffeine intake, saturated fat and cholesterol, caloric balance, sufficient intake of fresh fruits, vegetables, and fiber.  -Stressed the importance of regular exercise.   -Substance Abuse: Discussed cessation/primary prevention of tobacco, alcohol, or other drug use; driving or other dangerous activities under the influence; availability of treatment for abuse.   -Injury prevention: Discussed safety belts, safety helmets, smoke detector, smoking near bedding or upholstery.   -Sexuality: Discussed sexually transmitted diseases, partner selection, use of condoms, avoidance of unintended pregnancy and contraceptive alternatives.   -Dental health: Discussed importance of regular tooth brushing, flossing, and dental visits.  -Health maintenance and immunizations reviewed. Please refer to Health maintenance section.  Return to care in 1 year for next preventative visit.     Subjective:  HPI:  He has no acute complaints today.   Lifestyle Diet: Working on low carb and high protein.   Exercise: Does a lot of walking.      08/29/2021    8:43 AM  Depression screen PHQ 2/9  Decreased Interest 0  Down, Depressed, Hopeless 0  PHQ - 2 Score 0    Health Maintenance Due  Topic Date Due   HIV Screening  Never done   TETANUS/TDAP  Never done   Zoster Vaccines- Shingrix (1 of 2) Never done   INFLUENZA VACCINE  08/16/2021     ROS: Per HPI, otherwise a complete review of systems was negative.   PMH:  The following were reviewed and entered/updated in epic: Past Medical History:  Diagnosis Date   Acute meniscal tear of left knee    Allergy    fire ants   Back pain    Complication of anesthesia    Edema of both lower extremities    Fatigue    History of kidney stones    Hyperlipidemia    Hypertension    Joint pain    Osteoarthritis    PONV (postoperative nausea and vomiting)    Sleep apnea    SOB (shortness of breath) on exertion    Vitamin D deficiency    Patient Active Problem List   Diagnosis Date Noted   Erectile dysfunction 08/29/2021   Sun-damaged skin 08/29/2021   Insulin resistance 07/25/2021   Gout 07/25/2021   Essential hypertension 01/03/2021   Dyslipidemia 01/03/2021   BPH (benign prostatic hyperplasia) 01/07/2020   Vitamin D deficiency 06/06/2018   Hyperuricemia 05/09/2016   Family history of colon cancer 05/09/2016   Left medial knee pain 03/12/2015   History of colonic polyps 09/11/2011   NEPHROLITHIASIS, HX OF 08/20/2008   Osteoarthrosis, unspecified whether generalized or localized, unspecified site 01/03/2007   LUMBAR [place] DISORDER 01/03/2007  Past Surgical History:  Procedure Laterality Date   BACK SURGERY     laminectomy L5-S1   COLONOSCOPY WITH PROPOFOL N/A 03/14/2017   Procedure: COLONOSCOPY WITH PROPOFOL;  Surgeon: Clarene Essex, MD;  Location: [facility];  Service: Endoscopy;  Laterality: N/A;   Colonscopy     polyps   CYSTOSCOPY KIDNEY W/ URETERAL GUIDE WIRE  2010   lithotripsy    CYSTOSCOPY/RETROGRADE/URETEROSCOPY/STONE EXTRACTION WITH BASKET Right 02/16/2016   Procedure: CYSTOSCOPY/RETROGRADE/RIGHT FLEXIBLE URETEROSCOPY/STONE EXTRACTION WITH BASKET;  Surgeon: Raynelle Bring, MD;  Location: [facility];  Service: Urology;  Laterality: Right;   HOLMIUM LASER APPLICATION Left 05/09/5359   Procedure: HOLMIUM LASER APPLICATION;  Surgeon: Raynelle Bring, MD;  Location: [facility];  Service: Urology;  Laterality: Left;   KNEE ARTHROSCOPY Right 2013   KNEE ARTHROSCOPY WITH MEDIAL MENISECTOMY Left 03/12/2015   Procedure: LEFT KNEE ARTHROSCOPY WITH PARTIAL MEDIAL MENISCECTOMY;  Surgeon: Mcarthur Rossetti, MD;  Location: [facility];  Service: Orthopedics;  Laterality: Left;   LAMINECTOMY  1999   MR HIPS BILATERAL     2009&2010   TOTAL HIP REVISION  09/15/2011   Procedure: TOTAL HIP REVISION;  Surgeon: Mcarthur Rossetti, MD;  Location: [facility];  Service: Orthopedics;  Laterality: Right;  Revision right hip acetabular component    Family History  Problem Relation Age of Onset   Obesity Mother    Hypertension Mother    Diabetes Mother    Stroke Mother    Kidney failure Mother    Heart disease Mother    Obesity Father    Hypertension Father    Colon cancer Father    Cancer Father    Anemia Father    Diabetes Father    Heart attack Sister    Obesity Sister    Prostatitis Brother    Heart failure Brother    Sleep apnea Brother    Obesity Brother    Obesity Brother    Obesity Brother    Obesity Brother    Prostate cancer Neg Hx     Medications- reviewed and updated Current Outpatient Medications  Medication Sig Dispense Refill   allopurinol (ZYLOPRIM) 300 MG tablet Take 1 tablet (300 mg total) by mouth daily. 90 tablet 3   Cholecalciferol (VITAMIN D3) 50 MCG (2000 UT) TABS Take by mouth daily.     hydrochlorothiazide (HYDRODIURIL) 25 MG tablet TAKE 1 TABLET (25 MG TOTAL) BY MOUTH DAILY. (Patient taking differently: Take 1 tablet by mouth as needed.) 90 tablet 3    ketoconazole (NIZORAL) 2 % cream Apply topically.     ketoconazole 2%-triamcinolone 0.1% 1:2 cream mixture Apply topically daily as needed 45 g 5   Loratadine 10 MG CAPS Take 10 mg by mouth at bedtime.      losartan (COZAAR) 100 MG tablet Take 1 tablet (100 mg total) by mouth daily. 90 tablet 3   tadalafil (CIALIS) 10 MG tablet Take 1 tablet (10 mg total) by mouth every other day as needed for erectile dysfunction. 90 tablet 3   No current facility-administered medications for this visit.    Allergies-reviewed and updated No Known Allergies  Social History   Socioeconomic History   Marital status: Widowed    Spouse name: Not on file   Number of children: Not on file   Years of education: Not on file   Highest education level: Not on file  Occupational History   Not on file  Tobacco Use   Smoking status: Never   Smokeless tobacco: Never  Vaping  Use   Vaping Use: Never used  Substance and Sexual Activity   Alcohol use: Yes    Alcohol/week: 7.0 standard drinks of alcohol    Types: 7 Glasses of wine per week    Comment: glass of wine per night   Drug use: No   Sexual activity: Not on file  Other Topics Concern   Not on file  Social History Narrative   Not on file   Social Determinants of Health   Financial Resource Strain: Not on file  Food Insecurity: Not on file  Transportation Needs: Not on file  Physical Activity: Not on file  Stress: Not on file  Social Connections: Not on file        Objective:  Physical Exam: BP 135/76   Pulse 61   Temp 98.2 F (36.8 C) (Temporal)   Ht '5\' 8"'$  (1.727 m)   Wt 288 lb 6.4 oz (130.8 kg)   SpO2 96%   BMI 43.85 kg/m   Body mass index is 43.85 kg/m. Wt Readings from Last 3 Encounters:  08/29/21 288 lb 6.4 oz (130.8 kg)  08/17/21 289 lb (131.1 kg)  07/25/21 285 lb (129.3 kg)   Gen: NAD, resting comfortably HEENT: TMs normal bilaterally. OP clear. No thyromegaly noted.  CV: RRR with no murmurs appreciated Pulm: NWOB,  CTAB with no crackles, wheezes, or rhonchi GI: Normal bowel sounds present. Soft, Nontender, Nondistended. MSK: no edema, cyanosis, or clubbing noted Skin: warm, dry Neuro: CN2-12 grossly intact. Strength 5/5 in upper and lower extremities. Reflexes symmetric and intact bilaterally.  Psych: Normal affect and thought content     Brylynn Hanssen M. Jerline Pain, MD 08/29/2021 9:36 AM

## 2021-08-29 NOTE — Assessment & Plan Note (Signed)
Last LDL 105. Continue lifestyle modifications.

## 2021-08-29 NOTE — Telephone Encounter (Signed)
Patient states [facility] does not have Cialis in stock.  Is requesting script to be sent to [facility] on [address].

## 2021-08-29 NOTE — Assessment & Plan Note (Signed)
At goal today. Continue losartan '100mg'$  daily and HCTZ '25mg'$  daily.

## 2021-08-29 NOTE — Assessment & Plan Note (Signed)
We will refer to dermatology.

## 2021-08-30 ENCOUNTER — Other Ambulatory Visit (HOSPITAL_COMMUNITY): Payer: Self-pay

## 2021-08-30 NOTE — Telephone Encounter (Signed)
Patient states he is leaving town 08/31/21 am to go out of town. Patient states he is out of tadalafil (CIALIS) 10 MG tablet medication and requests a new RX be sent asap to [facility] on [facility].

## 2021-08-31 ENCOUNTER — Other Ambulatory Visit (HOSPITAL_COMMUNITY): Payer: Self-pay

## 2021-09-01 NOTE — Telephone Encounter (Signed)
Pt was sent prescription on 08/29/21

## 2021-09-07 ENCOUNTER — Ambulatory Visit (INDEPENDENT_AMBULATORY_CARE_PROVIDER_SITE_OTHER): Payer: No Typology Code available for payment source | Admitting: Family Medicine

## 2021-09-14 ENCOUNTER — Ambulatory Visit (INDEPENDENT_AMBULATORY_CARE_PROVIDER_SITE_OTHER): Payer: No Typology Code available for payment source | Admitting: Family Medicine

## 2021-09-14 ENCOUNTER — Encounter (INDEPENDENT_AMBULATORY_CARE_PROVIDER_SITE_OTHER): Payer: Self-pay | Admitting: Family Medicine

## 2021-09-14 VITALS — BP 156/68 | HR 57 | Temp 97.9°F | Ht 68.0 in | Wt 281.0 lb

## 2021-09-14 DIAGNOSIS — E669 Obesity, unspecified: Secondary | ICD-10-CM

## 2021-09-14 DIAGNOSIS — E559 Vitamin D deficiency, unspecified: Secondary | ICD-10-CM | POA: Diagnosis not present

## 2021-09-14 DIAGNOSIS — Z6841 Body Mass Index (BMI) 40.0 and over, adult: Secondary | ICD-10-CM

## 2021-09-14 DIAGNOSIS — E8881 Metabolic syndrome: Secondary | ICD-10-CM

## 2021-09-21 NOTE — Progress Notes (Signed)
Chief Complaint:   OBESITY Keith Brown is here to discuss his progress with his obesity treatment plan along with follow-up of his obesity related diagnoses. Keith Brown is on the Category 3 Plan and states he is following his eating plan approximately 50% of the time. Keith Brown states he is walking 4 times per week.  Today's visit was #: 13 Starting weight: 317 lbs Starting date: 02/02/2021 Today's weight: 281 lbs Today's date: 09/14/2021 Total lbs lost to date: 36 Total lbs lost since last in-office visit: 8  Interim History: Keith Brown continues to do well with his weight loss. He has done some celebration eating. He is walking more and he is getting ready to retire, and he is now engaged. He is working on increasing his protein.   Subjective:   1. Insulin resistance Keith Brown continues to work on his diet and weight loss. He has decreased simple carbohydrates. His hunger is mostly controlled.   2. Vitamin D deficiency Keith Brown's last Vitamin D level was at goal on OTC Vitamin D 2,000 IU daily.   Assessment/Plan:   1. Insulin resistance Keith Brown will continue with his diet and weight loss, and we will recheck labs in 2 months.   2. Vitamin D deficiency Keith Brown will continue OTC Vitamin D 2,000 IU daily, and we will recheck labs in 2 months.   3. Obesity, Current BMI 42.8 Keith Brown is currently in the action stage of change. As such, his goal is to continue with weight loss efforts. He has agreed to the Category 3 Plan.   Exercise goals: As is.   Behavioral modification strategies: increasing lean protein intake.  Keith Brown has agreed to follow-up with our clinic in 5 weeks. He was informed of the importance of frequent follow-up visits to maximize his success with intensive lifestyle modifications for his multiple health conditions.   Objective:   Blood pressure (!) 156/68, pulse (!) 57, temperature 97.9 F (36.6 C), height '5\' 8"'$  (1.727 m), weight 281 lb (127.5 kg), SpO2 96 %. Body mass index is 42.73  kg/m.  General: Cooperative, alert, well developed, in no acute distress. HEENT: Conjunctivae and lids unremarkable. Cardiovascular: Regular rhythm.  Lungs: Normal work of breathing. Neurologic: No focal deficits.   Lab Results  Component Value Date   CREATININE 1.04 07/25/2021   BUN 20 07/25/2021   NA 142 07/25/2021   K 4.1 07/25/2021   CL 105 07/25/2021   CO2 20 07/25/2021   Lab Results  Component Value Date   ALT 18 07/25/2021   AST 18 07/25/2021   ALKPHOS 77 07/25/2021   BILITOT 0.6 07/25/2021   Lab Results  Component Value Date   HGBA1C 5.4 07/25/2021   HGBA1C 5.5 01/03/2021   HGBA1C 5.5 03/26/2019   HGBA1C 5.3 06/05/2018   HGBA1C 5.6 01/23/2017   Lab Results  Component Value Date   INSULIN 19.4 07/25/2021   INSULIN 20.6 02/02/2021   Lab Results  Component Value Date   TSH 2.63 01/03/2021   Lab Results  Component Value Date   CHOL 178 07/25/2021   HDL 56 07/25/2021   LDLCALC 105 (H) 07/25/2021   TRIG 91 07/25/2021   CHOLHDL 3 01/03/2021   Lab Results  Component Value Date   VD25OH 59.5 07/25/2021   VD25OH 61.59 01/03/2021   VD25OH 59 03/31/2020   Lab Results  Component Value Date   WBC 8.8 07/25/2021   HGB 15.7 07/25/2021   HCT 46.8 07/25/2021   MCV 89 07/25/2021   PLT 156 07/25/2021  Lab Results  Component Value Date   IRON 108 03/31/2020   TIBC 307 03/31/2020   FERRITIN 451 (H) 03/31/2020   Attestation Statements:   Reviewed by clinician on day of visit: allergies, medications, problem list, medical history, surgical history, family history, social history, and previous encounter notes.   I, Trixie Dredge, am acting as transcriptionist for Dennard Nip, MD.  I have reviewed the above documentation for accuracy and completeness, and I agree with the above. -  Dennard Nip, MD

## 2021-09-29 ENCOUNTER — Other Ambulatory Visit (HOSPITAL_COMMUNITY): Payer: Self-pay

## 2021-10-10 ENCOUNTER — Encounter: Payer: Self-pay | Admitting: *Deleted

## 2021-10-12 ENCOUNTER — Encounter (INDEPENDENT_AMBULATORY_CARE_PROVIDER_SITE_OTHER): Payer: Self-pay | Admitting: Family Medicine

## 2021-10-12 ENCOUNTER — Ambulatory Visit (INDEPENDENT_AMBULATORY_CARE_PROVIDER_SITE_OTHER): Payer: No Typology Code available for payment source | Admitting: Family Medicine

## 2021-10-12 VITALS — BP 152/90 | HR 76 | Temp 97.8°F | Ht 68.0 in | Wt 286.0 lb

## 2021-10-12 DIAGNOSIS — R7303 Prediabetes: Secondary | ICD-10-CM | POA: Insufficient documentation

## 2021-10-12 DIAGNOSIS — E669 Obesity, unspecified: Secondary | ICD-10-CM | POA: Diagnosis not present

## 2021-10-12 DIAGNOSIS — Z6841 Body Mass Index (BMI) 40.0 and over, adult: Secondary | ICD-10-CM | POA: Diagnosis not present

## 2021-10-17 NOTE — Progress Notes (Signed)
Chief Complaint:   OBESITY Keith Brown is here to discuss his progress with his obesity treatment plan along with follow-up of his obesity related diagnoses. [name] is on the Category 3 Plan and states he is following his eating plan approximately 15-20% of the time. [name] states he is walking 2-3 miles weekly.    Today's visit was #: 14 Starting weight: 317 lbs Starting date: 02/02/2021 Today's weight: 286 lbs Today's date: 10/12/2021 Total lbs lost to date: 31 Total lbs lost since last in-office visit: 0  Interim History: Keith Brown did more celebration eating, and much of his weight gain is water weight. He notes increased stress at work and he is doing more stress eating.   Subjective:   1. Prediabetes Keith Brown is working on his diet and weight loss. He had declined metformin previously.   Assessment/Plan:   1. Prediabetes Keith Brown will continue to increase his protein intake and decrease simple carbohydrates.   2. Obesity, Current BMI 43.5 Keith Brown is currently in the action stage of change. As such, his goal is to continue with weight loss efforts. He has agreed to the Category 3 Plan.   Exercise goals: As is.   Behavioral modification strategies: increasing lean protein intake.  Keith Brown has agreed to follow-up with our clinic in 4 weeks. He was informed of the importance of frequent follow-up visits to maximize his success with intensive lifestyle modifications for his multiple health conditions.   Objective:   Blood pressure (!) 152/90, pulse 76, temperature 97.8 F (36.6 C), height '5\' 8"'$  (1.727 m), weight 286 lb (129.7 kg), SpO2 98 %. Body mass index is 43.49 kg/m.  General: Cooperative, alert, well developed, in no acute distress. HEENT: Conjunctivae and lids unremarkable. Cardiovascular: Regular rhythm.  Lungs: Normal work of breathing. Neurologic: No focal deficits.   Lab Results  Component Value Date   CREATININE 1.04 07/25/2021   BUN 20 07/25/2021   NA 142 07/25/2021    K 4.1 07/25/2021   CL 105 07/25/2021   CO2 20 07/25/2021   Lab Results  Component Value Date   ALT 18 07/25/2021   AST 18 07/25/2021   ALKPHOS 77 07/25/2021   BILITOT 0.6 07/25/2021   Lab Results  Component Value Date   HGBA1C 5.4 07/25/2021   HGBA1C 5.5 01/03/2021   HGBA1C 5.5 03/26/2019   HGBA1C 5.3 06/05/2018   HGBA1C 5.6 01/23/2017   Lab Results  Component Value Date   INSULIN 19.4 07/25/2021   INSULIN 20.6 02/02/2021   Lab Results  Component Value Date   TSH 2.63 01/03/2021   Lab Results  Component Value Date   CHOL 178 07/25/2021   HDL 56 07/25/2021   LDLCALC 105 (H) 07/25/2021   TRIG 91 07/25/2021   CHOLHDL 3 01/03/2021   Lab Results  Component Value Date   VD25OH 59.5 07/25/2021   VD25OH 61.59 01/03/2021   VD25OH 59 03/31/2020   Lab Results  Component Value Date   WBC 8.8 07/25/2021   HGB 15.7 07/25/2021   HCT 46.8 07/25/2021   MCV 89 07/25/2021   PLT 156 07/25/2021   Lab Results  Component Value Date   IRON 108 03/31/2020   TIBC 307 03/31/2020   FERRITIN 451 (H) 03/31/2020   Attestation Statements:   Reviewed by clinician on day of visit: allergies, medications, problem list, medical history, surgical history, family history, social history, and previous encounter notes.  Time spent on visit including pre-visit chart review and post-visit care and charting was 33  minutes.   I, Trixie Dredge, am acting as transcriptionist for Dennard Nip, MD.  I have reviewed the above documentation for accuracy and completeness, and I agree with the above. -  Dennard Nip, MD

## 2021-11-09 ENCOUNTER — Other Ambulatory Visit (HOSPITAL_COMMUNITY): Payer: Self-pay

## 2021-11-09 ENCOUNTER — Encounter (INDEPENDENT_AMBULATORY_CARE_PROVIDER_SITE_OTHER): Payer: Self-pay | Admitting: Family Medicine

## 2021-11-09 ENCOUNTER — Ambulatory Visit (INDEPENDENT_AMBULATORY_CARE_PROVIDER_SITE_OTHER): Payer: No Typology Code available for payment source | Admitting: Family Medicine

## 2021-11-09 VITALS — BP 173/84 | HR 75 | Temp 98.2°F | Ht 68.0 in | Wt 289.0 lb

## 2021-11-09 DIAGNOSIS — R7303 Prediabetes: Secondary | ICD-10-CM

## 2021-11-09 DIAGNOSIS — Z6841 Body Mass Index (BMI) 40.0 and over, adult: Secondary | ICD-10-CM

## 2021-11-09 DIAGNOSIS — I10 Essential (primary) hypertension: Secondary | ICD-10-CM | POA: Diagnosis not present

## 2021-11-09 DIAGNOSIS — E669 Obesity, unspecified: Secondary | ICD-10-CM | POA: Diagnosis not present

## 2021-11-09 DIAGNOSIS — F439 Reaction to severe stress, unspecified: Secondary | ICD-10-CM | POA: Diagnosis not present

## 2021-11-09 MED ORDER — LOSARTAN POTASSIUM 100 MG PO TABS
100.0000 mg | ORAL_TABLET | Freq: Every day | ORAL | 3 refills | Status: DC
  Filled 2021-11-09 – 2022-02-01 (×2): qty 90, 90d supply, fill #0

## 2021-11-11 ENCOUNTER — Other Ambulatory Visit (HOSPITAL_COMMUNITY): Payer: Self-pay

## 2021-11-14 NOTE — Progress Notes (Unsigned)
Chief Complaint:   OBESITY Mc is here to discuss his progress with his obesity treatment plan along with follow-up of his obesity related diagnoses. Danial is on {MWMwtlossportion/plan2:23431} and states he is following his eating plan approximately ***% of the time. Brendt states he is *** *** minutes *** times per week.  Today's visit was #: *** Starting weight: *** Starting date: *** Today's weight: *** Today's date: 11/09/2021 Total lbs lost to date: *** Total lbs lost since last in-office visit: ***  Interim History: ***  Subjective:   1. Stress ***  2. Prediabetes ***  3. Essential hypertension ***  Assessment/Plan:   1. Stress ***  2. Prediabetes ***  3. Essential hypertension *** - losartan (COZAAR) 100 MG tablet; Take 1 tablet (100 mg total) by mouth daily.  Dispense: 90 tablet; Refill: 3  4. Obesity, Current BMI 44.0 Loring is currently in the action stage of change. As such, his goal is to continue with weight loss efforts. He has agreed to the Category 3 Plan.   Exercise goals: As is.   Behavioral modification strategies: increasing lean protein intake.  Tranquilino has agreed to follow-up with our clinic in 3 to 4 weeks. He was informed of the importance of frequent follow-up visits to maximize his success with intensive lifestyle modifications for his multiple health conditions.   Objective:   Blood pressure (!) 173/84, pulse 75, temperature 98.2 F (36.8 C), height '5\' 8"'$  (1.727 m), weight 289 lb (131.1 kg), SpO2 96 %. Body mass index is 43.94 kg/m.  General: Cooperative, alert, well developed, in no acute distress. HEENT: Conjunctivae and lids unremarkable. Cardiovascular: Regular rhythm.  Lungs: Normal work of breathing. Neurologic: No focal deficits.   Lab Results  Component Value Date   CREATININE 1.04 07/25/2021   BUN 20 07/25/2021   NA 142 07/25/2021   K 4.1 07/25/2021   CL 105 07/25/2021   CO2 20 07/25/2021   Lab Results   Component Value Date   ALT 18 07/25/2021   AST 18 07/25/2021   ALKPHOS 77 07/25/2021   BILITOT 0.6 07/25/2021   Lab Results  Component Value Date   HGBA1C 5.4 07/25/2021   HGBA1C 5.5 01/03/2021   HGBA1C 5.5 03/26/2019   HGBA1C 5.3 06/05/2018   HGBA1C 5.6 01/23/2017   Lab Results  Component Value Date   INSULIN 19.4 07/25/2021   INSULIN 20.6 02/02/2021   Lab Results  Component Value Date   TSH 2.63 01/03/2021   Lab Results  Component Value Date   CHOL 178 07/25/2021   HDL 56 07/25/2021   LDLCALC 105 (H) 07/25/2021   TRIG 91 07/25/2021   CHOLHDL 3 01/03/2021   Lab Results  Component Value Date   VD25OH 59.5 07/25/2021   VD25OH 61.59 01/03/2021   VD25OH 59 03/31/2020   Lab Results  Component Value Date   WBC 8.8 07/25/2021   HGB 15.7 07/25/2021   HCT 46.8 07/25/2021   MCV 89 07/25/2021   PLT 156 07/25/2021   Lab Results  Component Value Date   IRON 108 03/31/2020   TIBC 307 03/31/2020   FERRITIN 451 (H) 03/31/2020   Attestation Statements:   Reviewed by clinician on day of visit: allergies, medications, problem list, medical history, surgical history, family history, social history, and previous encounter notes.   I, Trixie Dredge, am acting as transcriptionist for Dennard Nip, MD.  I have reviewed the above documentation for accuracy and completeness, and I agree with the above. -  ***

## 2021-11-15 ENCOUNTER — Other Ambulatory Visit (HOSPITAL_COMMUNITY): Payer: Self-pay

## 2021-11-15 MED ORDER — METFORMIN HCL 500 MG PO TABS
500.0000 mg | ORAL_TABLET | Freq: Every day | ORAL | 0 refills | Status: DC
  Filled 2021-11-15 – 2021-11-30 (×2): qty 30, 30d supply, fill #0

## 2021-11-24 ENCOUNTER — Other Ambulatory Visit (HOSPITAL_COMMUNITY): Payer: Self-pay

## 2021-11-30 ENCOUNTER — Other Ambulatory Visit (HOSPITAL_COMMUNITY): Payer: Self-pay

## 2021-12-01 ENCOUNTER — Other Ambulatory Visit (HOSPITAL_COMMUNITY): Payer: Self-pay

## 2021-12-06 ENCOUNTER — Encounter (INDEPENDENT_AMBULATORY_CARE_PROVIDER_SITE_OTHER): Payer: Self-pay | Admitting: Family Medicine

## 2021-12-06 ENCOUNTER — Encounter: Payer: Self-pay | Admitting: Family Medicine

## 2021-12-06 ENCOUNTER — Telehealth (INDEPENDENT_AMBULATORY_CARE_PROVIDER_SITE_OTHER): Payer: Medicare Other | Admitting: Family Medicine

## 2021-12-06 ENCOUNTER — Other Ambulatory Visit (HOSPITAL_COMMUNITY): Payer: Self-pay

## 2021-12-06 DIAGNOSIS — E88819 Insulin resistance, unspecified: Secondary | ICD-10-CM | POA: Diagnosis not present

## 2021-12-06 DIAGNOSIS — E669 Obesity, unspecified: Secondary | ICD-10-CM

## 2021-12-06 DIAGNOSIS — R7303 Prediabetes: Secondary | ICD-10-CM

## 2021-12-06 DIAGNOSIS — Z6841 Body Mass Index (BMI) 40.0 and over, adult: Secondary | ICD-10-CM

## 2021-12-06 MED ORDER — METFORMIN HCL 500 MG PO TABS
500.0000 mg | ORAL_TABLET | Freq: Every day | ORAL | 0 refills | Status: DC
  Filled 2021-12-06: qty 30, 30d supply, fill #0

## 2021-12-20 NOTE — Progress Notes (Unsigned)
TeleHealth Visit:  Due to the COVID-19 pandemic, this visit was completed with telemedicine (audio/video) technology to reduce patient and provider exposure as well as to preserve personal protective equipment.   Press has verbally consented to this TeleHealth visit. The patient is located at home, the provider is located at the [facility] office. The participants in this visit include the listed provider and patient. The visit was conducted today via MyChart video.   Chief Complaint: OBESITY Keith Brown is here to discuss his progress with his obesity treatment plan along with follow-up of his obesity related diagnoses. Keith Brown is on the Category 3 Plan and states he is following his eating plan approximately 40% of the time. Keith Brown states he is walking for 60 minutes 3 times per week.  Today's visit was #: 16 Starting weight: 317 lbs Starting date: 02/02/2021  Interim History: Keith Brown feels he is losing inches, but he has not decreased weight on his home scale.  He has increased his walking, but he has also done some celebration eating.  He is getting used to being retired.  Subjective:   1. Insulin resistance Keith Brown continues to work on his diet and exercise, and he is stable on metformin.              Assessment/Plan:   1. Insulin resistance We will refill metformin for 1 month.  Keith Brown will continue to decrease his simple carbohydrates.  - metFORMIN (GLUCOPHAGE) 500 MG tablet; Take 1 tablet (500 mg total) by mouth daily with breakfast.  Dispense: 30 tablet; Refill: 0  2. Obesity, Current BMI 44.0 Keith Brown is currently in the action stage of change. As such, his goal is to continue with weight loss efforts. He has agreed to the Category 3 Plan.   Exercise goals: As is.   Behavioral modification strategies: holiday eating strategies .  Keith Brown has agreed to follow-up with our clinic in 4 weeks. He was informed of the importance of frequent follow-up visits to maximize his  success with intensive lifestyle modifications for his multiple health conditions.  Objective:   VITALS: Per patient if applicable, see vitals. GENERAL: Alert and in no acute distress. CARDIOPULMONARY: No increased WOB. Speaking in clear sentences.  PSYCH: Pleasant and cooperative. Speech normal rate and rhythm. Affect is appropriate. Insight and judgement are appropriate. Attention is focused, linear, and appropriate.  NEURO: Oriented as arrived to appointment on time with no prompting.   Lab Results  Component Value Date   CREATININE 1.04 07/25/2021   BUN 20 07/25/2021   NA 142 07/25/2021   K 4.1 07/25/2021   CL 105 07/25/2021   CO2 20 07/25/2021   Lab Results  Component Value Date   ALT 18 07/25/2021   AST 18 07/25/2021   ALKPHOS 77 07/25/2021   BILITOT 0.6 07/25/2021   Lab Results  Component Value Date   HGBA1C 5.4 07/25/2021   HGBA1C 5.5 01/03/2021   HGBA1C 5.5 03/26/2019   HGBA1C 5.3 06/05/2018   HGBA1C 5.6 01/23/2017   Lab Results  Component Value Date   INSULIN 19.4 07/25/2021   INSULIN 20.6 02/02/2021   Lab Results  Component Value Date   TSH 2.63 01/03/2021   Lab Results  Component Value Date   CHOL 178 07/25/2021   HDL 56 07/25/2021   LDLCALC 105 (H) 07/25/2021   TRIG 91 07/25/2021   CHOLHDL 3 01/03/2021   Lab Results  Component Value Date   VD25OH 59.5 07/25/2021   VD25OH 61.59 01/03/2021  VD25OH 59 03/31/2020   Lab Results  Component Value Date   WBC 8.8 07/25/2021   HGB 15.7 07/25/2021   HCT 46.8 07/25/2021   MCV 89 07/25/2021   PLT 156 07/25/2021   Lab Results  Component Value Date   IRON 108 03/31/2020   TIBC 307 03/31/2020   FERRITIN 451 (H) 03/31/2020    Attestation Statements:   Reviewed by clinician on day of visit: allergies, medications, problem list, medical history, surgical history, family history, social history, and previous encounter notes.   I, Trixie Dredge, am acting as transcriptionist for Dennard Nip,  MD.  I have reviewed the above documentation for accuracy and completeness, and I agree with the above. - Dennard Nip, MD

## 2021-12-29 ENCOUNTER — Encounter: Payer: Self-pay | Admitting: *Deleted

## 2022-01-03 ENCOUNTER — Ambulatory Visit (INDEPENDENT_AMBULATORY_CARE_PROVIDER_SITE_OTHER): Payer: Medicare Other | Admitting: Family Medicine

## 2022-01-03 ENCOUNTER — Other Ambulatory Visit (HOSPITAL_COMMUNITY): Payer: Self-pay

## 2022-01-03 VITALS — BP 169/69 | HR 68 | Temp 98.2°F | Ht 68.0 in | Wt 300.0 lb

## 2022-01-03 DIAGNOSIS — E669 Obesity, unspecified: Secondary | ICD-10-CM | POA: Diagnosis not present

## 2022-01-03 DIAGNOSIS — Z6841 Body Mass Index (BMI) 40.0 and over, adult: Secondary | ICD-10-CM

## 2022-01-03 DIAGNOSIS — R7303 Prediabetes: Secondary | ICD-10-CM

## 2022-01-03 MED ORDER — METFORMIN HCL 500 MG PO TABS
500.0000 mg | ORAL_TABLET | Freq: Every day | ORAL | 0 refills | Status: DC
  Filled 2022-01-03: qty 30, 30d supply, fill #0

## 2022-01-13 ENCOUNTER — Other Ambulatory Visit (HOSPITAL_COMMUNITY): Payer: Self-pay

## 2022-01-24 NOTE — Progress Notes (Signed)
Chief Complaint:   OBESITY Keith Brown is here to discuss his progress with his obesity treatment plan along with follow-up of his obesity related diagnoses. Keith Brown is on the Category 3 Plan and states he is following his eating plan approximately (unknown)% of the time. Keith Brown states he is walking various minutes per week.  Today's visit was #: 64 Starting weight: 317 lbs Starting date: 02/02/2021 Today's weight: 300 lbs Today's date: 01/03/2022 Total lbs lost to date: 17 Total lbs lost since last in-office visit: 0  Interim History: Keith Brown has done more celebration eating in the last month, as he is enjoying his retirement.  He has been mindful of his eating, but he has indulged more than normal.  Subjective:   1. Prediabetes Keith Brown started metformin, and he is doing well but he notes a metallic taste.  Assessment/Plan:   1. Prediabetes We will refill metformin for 1 month.  Keith Brown was encouraged to increase his water intake and he was told the metallic taste would likely improve over time.  - metFORMIN (GLUCOPHAGE) 500 MG tablet; Take 1 tablet (500 mg total) by mouth daily with breakfast.  Dispense: 30 tablet; Refill: 0  2. Obesity, Current BMI 45.6 Keith Brown is currently in the action stage of change. As such, his Brown is to maintain weight for now. He has agreed to the Category 3 Plan.   Keith Brown is to maintain his weight over Christmas and get back to a structured eating plan at the beginning of the year.  Exercise goals: As is.   Behavioral modification strategies: increasing lean protein intake, holiday eating strategies , and celebration eating strategies.  Keith Brown has agreed to follow-up with our clinic in 4 weeks. He was informed of the importance of frequent follow-up visits to maximize his success with intensive lifestyle modifications for his multiple health conditions.   Objective:   Blood pressure (!) 169/69, pulse 68, temperature 98.2 F (36.8 C), height '5\' 8"'$  (1.727  m), weight 300 lb (136.1 kg), SpO2 96 %. Body mass index is 45.61 kg/m.  General: Cooperative, alert, well developed, in no acute distress. HEENT: Conjunctivae and lids unremarkable. Cardiovascular: Regular rhythm.  Lungs: Normal work of breathing. Neurologic: No focal deficits.   Lab Results  Component Value Date   CREATININE 1.04 07/25/2021   BUN 20 07/25/2021   NA 142 07/25/2021   K 4.1 07/25/2021   CL 105 07/25/2021   CO2 20 07/25/2021   Lab Results  Component Value Date   ALT 18 07/25/2021   AST 18 07/25/2021   ALKPHOS 77 07/25/2021   BILITOT 0.6 07/25/2021   Lab Results  Component Value Date   HGBA1C 5.4 07/25/2021   HGBA1C 5.5 01/03/2021   HGBA1C 5.5 03/26/2019   HGBA1C 5.3 06/05/2018   HGBA1C 5.6 01/23/2017   Lab Results  Component Value Date   INSULIN 19.4 07/25/2021   INSULIN 20.6 02/02/2021   Lab Results  Component Value Date   TSH 2.63 01/03/2021   Lab Results  Component Value Date   CHOL 178 07/25/2021   HDL 56 07/25/2021   LDLCALC 105 (H) 07/25/2021   TRIG 91 07/25/2021   CHOLHDL 3 01/03/2021   Lab Results  Component Value Date   VD25OH 59.5 07/25/2021   VD25OH 61.59 01/03/2021   VD25OH 59 03/31/2020   Lab Results  Component Value Date   WBC 8.8 07/25/2021   HGB 15.7 07/25/2021   HCT 46.8 07/25/2021   MCV 89 07/25/2021   PLT  156 07/25/2021   Lab Results  Component Value Date   IRON 108 03/31/2020   TIBC 307 03/31/2020   FERRITIN 451 (H) 03/31/2020   Attestation Statements:   Reviewed by clinician on day of visit: allergies, medications, problem list, medical history, surgical history, family history, social history, and previous encounter notes.  Time spent on visit including pre-visit chart review and post-visit care and charting was 30 minutes.   I, Trixie Dredge, am acting as transcriptionist for Dennard Nip, MD.  I have reviewed the above documentation for accuracy and completeness, and I agree with the above. -  Dennard Nip, MD

## 2022-02-01 ENCOUNTER — Other Ambulatory Visit (HOSPITAL_COMMUNITY): Payer: Self-pay

## 2022-02-07 ENCOUNTER — Other Ambulatory Visit (HOSPITAL_COMMUNITY): Payer: Self-pay

## 2022-02-07 ENCOUNTER — Encounter (INDEPENDENT_AMBULATORY_CARE_PROVIDER_SITE_OTHER): Payer: Self-pay | Admitting: Family Medicine

## 2022-02-07 ENCOUNTER — Ambulatory Visit (INDEPENDENT_AMBULATORY_CARE_PROVIDER_SITE_OTHER): Payer: Medicare Other | Admitting: Family Medicine

## 2022-02-07 VITALS — BP 171/62 | HR 76 | Temp 98.0°F | Ht 68.0 in | Wt 297.0 lb

## 2022-02-07 DIAGNOSIS — I10 Essential (primary) hypertension: Secondary | ICD-10-CM

## 2022-02-07 DIAGNOSIS — E669 Obesity, unspecified: Secondary | ICD-10-CM

## 2022-02-07 DIAGNOSIS — Z6841 Body Mass Index (BMI) 40.0 and over, adult: Secondary | ICD-10-CM

## 2022-02-07 DIAGNOSIS — R7303 Prediabetes: Secondary | ICD-10-CM

## 2022-02-07 MED ORDER — LOSARTAN POTASSIUM 100 MG PO TABS
100.0000 mg | ORAL_TABLET | Freq: Every day | ORAL | 0 refills | Status: DC
  Filled 2022-02-07: qty 90, 90d supply, fill #0

## 2022-02-07 MED ORDER — METFORMIN HCL 500 MG PO TABS
500.0000 mg | ORAL_TABLET | Freq: Every day | ORAL | 0 refills | Status: DC
  Filled 2022-02-07: qty 90, 90d supply, fill #0

## 2022-02-22 NOTE — Progress Notes (Signed)
Chief Complaint:   OBESITY Keith Brown is here to discuss his progress with his obesity treatment plan along with follow-up of his obesity related diagnoses. Keith Brown is on the Category 3 Plan and states he is following his eating plan approximately 40-50% of the time. Keith Brown states he is walking for exercise.   Today's visit was #: 18 Starting weight: 317 lbs Starting date: 02/02/2021 Today's weight: 297 lbs Today's date: 02/07/2022 Total lbs lost to date: 20 Total lbs lost since last in-office visit: 3  Interim History: Keith Brown has done well with weight loss even over the holidays and with traveling. His hunger is mostly controlled and he is trying to increase his exercise.   Subjective:   1. Prediabetes Keith Brown continues to do well with his diet, and he is tolerating metformin.   2. White coat syndrome with diagnosis of hypertension Keith Brown is followed by his PCP, Dr. Jerline Pain and appears to have some white coat syndrome.   Assessment/Plan:   1. Prediabetes We will refill metformin for 90 days. Keith Brown will continue to work on weight loss, exercise, and decreasing simple carbohydrates to help decrease the risk of diabetes.   - metFORMIN (GLUCOPHAGE) 500 MG tablet; Take 1 tablet (500 mg total) by mouth daily with breakfast.  Dispense: 90 tablet; Refill: 0  2. White coat syndrome with diagnosis of hypertension We will refill losartan for 90 days. Keith Brown will check his blood pressure at home.   - losartan (COZAAR) 100 MG tablet; Take 1 tablet (100 mg total) by mouth daily.  Dispense: 90 tablet; Refill: 0  3. BMI 45.0-49.9, adult (Keith Brown)  4. Obesity, Beginning BMI 48.20 Keith Brown is currently in the action stage of change. As such, his goal is to continue with weight loss efforts. He has agreed to the Category 3 Plan.   Exercise goals: As is.   Behavioral modification strategies: increasing lean protein intake.  Keith Brown has agreed to follow-up with our clinic in 4 weeks. He was informed of the  importance of frequent follow-up visits to maximize his success with intensive lifestyle modifications for his multiple health conditions.   Objective:   Blood pressure (!) 171/62, pulse 76, temperature 98 F (36.7 C), height '5\' 8"'$  (1.727 m), weight 297 lb (134.7 kg), SpO2 97 %. Body mass index is 45.16 kg/m.  General: Cooperative, alert, well developed, in no acute distress. HEENT: Conjunctivae and lids unremarkable. Cardiovascular: Regular rhythm.  Lungs: Normal work of breathing. Neurologic: No focal deficits.   Lab Results  Component Value Date   CREATININE 1.04 07/25/2021   BUN 20 07/25/2021   NA 142 07/25/2021   K 4.1 07/25/2021   CL 105 07/25/2021   CO2 20 07/25/2021   Lab Results  Component Value Date   ALT 18 07/25/2021   AST 18 07/25/2021   ALKPHOS 77 07/25/2021   BILITOT 0.6 07/25/2021   Lab Results  Component Value Date   HGBA1C 5.4 07/25/2021   HGBA1C 5.5 01/03/2021   HGBA1C 5.5 03/26/2019   HGBA1C 5.3 06/05/2018   HGBA1C 5.6 01/23/2017   Lab Results  Component Value Date   INSULIN 19.4 07/25/2021   INSULIN 20.6 02/02/2021   Lab Results  Component Value Date   TSH 2.63 01/03/2021   Lab Results  Component Value Date   CHOL 178 07/25/2021   HDL 56 07/25/2021   LDLCALC 105 (H) 07/25/2021   TRIG 91 07/25/2021   CHOLHDL 3 01/03/2021   Lab Results  Component Value Date  VD25OH 59.5 07/25/2021   VD25OH 61.59 01/03/2021   VD25OH 59 03/31/2020   Lab Results  Component Value Date   WBC 8.8 07/25/2021   HGB 15.7 07/25/2021   HCT 46.8 07/25/2021   MCV 89 07/25/2021   PLT 156 07/25/2021   Lab Results  Component Value Date   IRON 108 03/31/2020   TIBC 307 03/31/2020   FERRITIN 451 (H) 03/31/2020   Attestation Statements:   Reviewed by clinician on day of visit: allergies, medications, problem list, medical history, surgical history, family history, social history, and previous encounter notes.   I, Trixie Dredge, am acting as  transcriptionist for Dennard Nip, MD.  I have reviewed the above documentation for accuracy and completeness, and I agree with the above. -  Dennard Nip, MD

## 2022-03-14 ENCOUNTER — Encounter (INDEPENDENT_AMBULATORY_CARE_PROVIDER_SITE_OTHER): Payer: Self-pay | Admitting: Family Medicine

## 2022-03-14 ENCOUNTER — Other Ambulatory Visit: Payer: Self-pay

## 2022-03-14 ENCOUNTER — Ambulatory Visit (INDEPENDENT_AMBULATORY_CARE_PROVIDER_SITE_OTHER): Payer: Medicare Other | Admitting: Family Medicine

## 2022-03-14 VITALS — BP 169/83 | HR 60 | Temp 98.2°F | Ht 68.0 in | Wt 301.0 lb

## 2022-03-14 DIAGNOSIS — E669 Obesity, unspecified: Secondary | ICD-10-CM | POA: Diagnosis not present

## 2022-03-14 DIAGNOSIS — Z6841 Body Mass Index (BMI) 40.0 and over, adult: Secondary | ICD-10-CM | POA: Diagnosis not present

## 2022-03-14 DIAGNOSIS — R7303 Prediabetes: Secondary | ICD-10-CM

## 2022-03-24 ENCOUNTER — Other Ambulatory Visit: Payer: Self-pay | Admitting: Family Medicine

## 2022-03-24 ENCOUNTER — Other Ambulatory Visit (HOSPITAL_COMMUNITY): Payer: Self-pay

## 2022-03-24 MED ORDER — HYDROCHLOROTHIAZIDE 25 MG PO TABS
25.0000 mg | ORAL_TABLET | Freq: Every day | ORAL | 3 refills | Status: DC
  Filled 2022-03-24: qty 90, 90d supply, fill #0
  Filled 2022-06-30: qty 90, 90d supply, fill #1

## 2022-03-24 NOTE — Telephone Encounter (Signed)
LAST APPOINTMENT DATE: 08/29/2021   NEXT APPOINTMENT DATE: Visit date not found    LAST REFILL: 03/26/2019  QTY: 90 Ref 3  Last prescribed by Eunice Blase, MD

## 2022-04-03 NOTE — Progress Notes (Signed)
Chief Complaint:   OBESITY Keith Brown is here to discuss his progress with his obesity treatment plan along with follow-up of his obesity related diagnoses. [name] is on the Category 3 Plan and states he is following his eating plan approximately 30% of the time. [name] states he is walking for 20-30 minutes 2-3 times per week.  Today's visit was #: 28 Starting weight: 317 lbs Starting date: 02/02/2021 Today's weight: 301 lbs Today's date: 03/14/2022 Total lbs lost to date: 16 Total lbs lost since last in-office visit: 0  Interim History: Keith Brown is retaining fluid today.  He has increased eating out and also may be eating more sodium at home.  He has done more celebration eating.  Subjective:   1. Prediabetes Keith Brown is stable on metformin with no nausea or vomiting noted.  He is working on decreasing simple carbohydrates, but he still struggles at times.  Assessment/Plan:   1. Prediabetes Keith Brown will continue metformin, and he will continue to work on his diet and exercise.  2. BMI 45.0-49.9, adult ([place])  3. Obesity, Beginning BMI 48.20 Cage is currently in the action stage of change. As such, his goal is to continue with weight loss efforts. He has agreed to the Category 3 Plan.   Exercise goals: For substantial health benefits, adults should do at least 150 minutes (2 hours and 30 minutes) a week of moderate-intensity, or 75 minutes (1 hour and 15 minutes) a week of vigorous-intensity aerobic physical activity, or an equivalent combination of moderate- and vigorous-intensity aerobic activity. Aerobic activity should be performed in episodes of at least 10 minutes, and preferably, it should be spread throughout the week.  Behavioral modification strategies: increasing lean protein intake, decreasing eating out, and celebration eating strategies.  Keith Brown has agreed to follow-up with our clinic in 4 weeks. He was informed of the importance of frequent follow-up visits to maximize his  success with intensive lifestyle modifications for his multiple health conditions.   Objective:   Blood pressure (!) 169/83, pulse 60, temperature 98.2 F (36.8 C), height 5\' 8"  (1.727 m), weight (!) 301 lb (136.5 kg), SpO2 95 %. Body mass index is 45.77 kg/m.  Lab Results  Component Value Date   CREATININE 1.04 07/25/2021   BUN 20 07/25/2021   NA 142 07/25/2021   K 4.1 07/25/2021   CL 105 07/25/2021   CO2 20 07/25/2021   Lab Results  Component Value Date   ALT 18 07/25/2021   AST 18 07/25/2021   ALKPHOS 77 07/25/2021   BILITOT 0.6 07/25/2021   Lab Results  Component Value Date   HGBA1C 5.4 07/25/2021   HGBA1C 5.5 01/03/2021   HGBA1C 5.5 03/26/2019   HGBA1C 5.3 06/05/2018   HGBA1C 5.6 01/23/2017   Lab Results  Component Value Date   INSULIN 19.4 07/25/2021   INSULIN 20.6 02/02/2021   Lab Results  Component Value Date   TSH 2.63 01/03/2021   Lab Results  Component Value Date   CHOL 178 07/25/2021   HDL 56 07/25/2021   LDLCALC 105 (H) 07/25/2021   TRIG 91 07/25/2021   CHOLHDL 3 01/03/2021   Lab Results  Component Value Date   VD25OH 59.5 07/25/2021   VD25OH 61.59 01/03/2021   VD25OH 59 03/31/2020   Lab Results  Component Value Date   WBC 8.8 07/25/2021   HGB 15.7 07/25/2021   HCT 46.8 07/25/2021   MCV 89 07/25/2021   PLT 156 07/25/2021   Lab Results  Component Value Date  IRON 108 03/31/2020   TIBC 307 03/31/2020   FERRITIN 451 (H) 03/31/2020   Attestation Statements:   Reviewed by clinician on day of visit: allergies, medications, problem list, medical history, surgical history, family history, social history, and previous encounter notes.  Time spent on visit including pre-visit chart review and post-visit care and charting was 30 minutes.   I, Trixie Dredge, am acting as transcriptionist for Dennard Nip, MD.  I have reviewed the above documentation for accuracy and completeness, and I agree with the above. -  Dennard Nip, MD

## 2022-04-11 ENCOUNTER — Other Ambulatory Visit (HOSPITAL_COMMUNITY): Payer: Self-pay

## 2022-04-11 ENCOUNTER — Ambulatory Visit (INDEPENDENT_AMBULATORY_CARE_PROVIDER_SITE_OTHER): Payer: Medicare Other | Admitting: Family Medicine

## 2022-04-11 ENCOUNTER — Encounter (INDEPENDENT_AMBULATORY_CARE_PROVIDER_SITE_OTHER): Payer: Self-pay | Admitting: Family Medicine

## 2022-04-11 VITALS — BP 158/71 | HR 63 | Temp 98.7°F | Ht 68.0 in | Wt 297.0 lb

## 2022-04-11 DIAGNOSIS — I10 Essential (primary) hypertension: Secondary | ICD-10-CM | POA: Diagnosis not present

## 2022-04-11 DIAGNOSIS — R7303 Prediabetes: Secondary | ICD-10-CM | POA: Diagnosis not present

## 2022-04-11 DIAGNOSIS — Z6841 Body Mass Index (BMI) 40.0 and over, adult: Secondary | ICD-10-CM | POA: Diagnosis not present

## 2022-04-11 DIAGNOSIS — E669 Obesity, unspecified: Secondary | ICD-10-CM

## 2022-04-11 MED ORDER — METFORMIN HCL 500 MG PO TABS
500.0000 mg | ORAL_TABLET | Freq: Every day | ORAL | 0 refills | Status: DC
  Filled 2022-04-11: qty 90, 90d supply, fill #0

## 2022-04-11 MED ORDER — LOSARTAN POTASSIUM 100 MG PO TABS
100.0000 mg | ORAL_TABLET | Freq: Every day | ORAL | 0 refills | Status: DC
  Filled 2022-04-11: qty 90, 90d supply, fill #0

## 2022-04-12 NOTE — Progress Notes (Signed)
Chief Complaint:   OBESITY Keith Brown is here to discuss his progress with his obesity treatment plan along with follow-up of his obesity related diagnoses. Keith Brown is on the Category 3 Plan and states he is following his eating plan approximately 50-60% of the time. Keith Brown states he is walking 3 times per week.    Today's visit was #: 20 Starting weight: 317 lbs Starting date: 02/02/2021 Today's weight: 297 lbs Today's date: 04/11/2022 Total lbs lost to date: 20 Total lbs lost since last in-office visit: 4  Interim History: Keith Brown continues to do well with his weight loss. He is active in his retirement and has decreased snacking. His hunger is controlled.   Subjective:   1. Prediabetes Keith Brown is doing well with his diet, and he has no problems with his medications.   2. Essential hypertension Keith Brown's blood pressure remains elevated, but it has been improving with his weight loss. He denies chest pain or headache.   Assessment/Plan:   1. Prediabetes Keith Brown will continue with his diet and metformin, and we will refill metformin for 90 days.   - metFORMIN (GLUCOPHAGE) 500 MG tablet; Take 1 tablet (500 mg total) by mouth daily with breakfast.  Dispense: 90 tablet; Refill: 0  2. Essential hypertension Keith Brown will continue losartan, and we will refill for 90 days.   - losartan (COZAAR) 100 MG tablet; Take 1 tablet (100 mg total) by mouth daily.  Dispense: 90 tablet; Refill: 0  3. BMI 45.0-49.9, adult (Keith Brown)  4. Obesity, Beginning BMI 48.20 Keith Brown is currently in the action stage of change. As such, his goal is to continue with weight loss efforts. He has agreed to the Category 3 Plan.   Exercise goals: As is, add strengthening exercise.   Behavioral modification strategies: increasing lean protein intake.  Keith Brown has agreed to follow-up with our clinic in 4 to 5 weeks. He was informed of the importance of frequent follow-up visits to maximize his success with intensive lifestyle  modifications for his multiple health conditions.   Objective:   Blood pressure (!) 158/71, pulse 63, temperature 98.7 F (37.1 C), height 5\' 8"  (1.727 m), weight 297 lb (134.7 kg), SpO2 98 %. Body mass index is 45.16 kg/m.  Lab Results  Component Value Date   CREATININE 1.04 07/25/2021   BUN 20 07/25/2021   NA 142 07/25/2021   K 4.1 07/25/2021   CL 105 07/25/2021   CO2 20 07/25/2021   Lab Results  Component Value Date   ALT 18 07/25/2021   AST 18 07/25/2021   ALKPHOS 77 07/25/2021   BILITOT 0.6 07/25/2021   Lab Results  Component Value Date   HGBA1C 5.4 07/25/2021   HGBA1C 5.5 01/03/2021   HGBA1C 5.5 03/26/2019   HGBA1C 5.3 06/05/2018   HGBA1C 5.6 01/23/2017   Lab Results  Component Value Date   INSULIN 19.4 07/25/2021   INSULIN 20.6 02/02/2021   Lab Results  Component Value Date   TSH 2.63 01/03/2021   Lab Results  Component Value Date   CHOL 178 07/25/2021   HDL 56 07/25/2021   LDLCALC 105 (H) 07/25/2021   TRIG 91 07/25/2021   CHOLHDL 3 01/03/2021   Lab Results  Component Value Date   VD25OH 59.5 07/25/2021   VD25OH 61.59 01/03/2021   VD25OH 59 03/31/2020   Lab Results  Component Value Date   WBC 8.8 07/25/2021   HGB 15.7 07/25/2021   HCT 46.8 07/25/2021   MCV 89 07/25/2021   PLT  156 07/25/2021   Lab Results  Component Value Date   IRON 108 03/31/2020   TIBC 307 03/31/2020   FERRITIN 451 (H) 03/31/2020   Attestation Statements:   Reviewed by clinician on day of visit: allergies, medications, problem list, medical history, surgical history, family history, social history, and previous encounter notes.   I, Trixie Dredge, am acting as transcriptionist for Dennard Nip, MD.  I have reviewed the above documentation for accuracy and completeness, and I agree with the above. -  Dennard Nip, MD

## 2022-05-16 ENCOUNTER — Encounter (INDEPENDENT_AMBULATORY_CARE_PROVIDER_SITE_OTHER): Payer: Self-pay | Admitting: Family Medicine

## 2022-05-16 ENCOUNTER — Other Ambulatory Visit (HOSPITAL_COMMUNITY): Payer: Self-pay

## 2022-05-16 ENCOUNTER — Ambulatory Visit (INDEPENDENT_AMBULATORY_CARE_PROVIDER_SITE_OTHER): Payer: Medicare Other | Admitting: Family Medicine

## 2022-05-16 VITALS — BP 185/72 | HR 65 | Temp 98.5°F | Ht 68.0 in | Wt 299.0 lb

## 2022-05-16 DIAGNOSIS — R7303 Prediabetes: Secondary | ICD-10-CM

## 2022-05-16 DIAGNOSIS — I10 Essential (primary) hypertension: Secondary | ICD-10-CM

## 2022-05-16 DIAGNOSIS — Z6841 Body Mass Index (BMI) 40.0 and over, adult: Secondary | ICD-10-CM | POA: Diagnosis not present

## 2022-05-16 DIAGNOSIS — E669 Obesity, unspecified: Secondary | ICD-10-CM

## 2022-05-16 MED ORDER — METFORMIN HCL 500 MG PO TABS
500.0000 mg | ORAL_TABLET | Freq: Two times a day (BID) | ORAL | 0 refills | Status: DC
  Filled 2022-05-16 – 2022-06-15 (×3): qty 180, 90d supply, fill #0

## 2022-05-17 NOTE — Progress Notes (Unsigned)
Chief Complaint:   OBESITY Keith Brown is here to discuss his progress with his obesity treatment plan along with follow-up of his obesity related diagnoses. Keith Brown is on the Category 3 Plan and states he is following his eating plan approximately 50% of the time. Keith Brown states he is walking for 30 minutes 2 times per week.  Today's visit was #: 21 Starting weight: 317 lbs Starting date: 02/02/2021 Today's weight: 299 lbs Today's date: 05/16/2022 Total lbs lost to date: 18 Total lbs lost since last in-office visit: 0  Interim History: Keith Brown has been more active recently with walking and doing yard work. He has been eating out more and his weight is up today.   Subjective:   1. Essential hypertension Keith Brown's medications are being managed by his PCP. His blood pressure is usually higher in our office than when he checks it at home or with his PCP.   2. Prediabetes Keith Brown is on metformin but he is still struggling with polyphagia. He denies nausea or vomiting.   Assessment/Plan:   1. Essential hypertension Keith Brown is to continue to work on his diet, exercise, and weight loss to improve his blood pressure and decrease the risk of MI or CVA.   2. Prediabetes Keith Brown agreed to increase metformin to 500 mg BID, and we will refill for 90 days.   - metFORMIN (GLUCOPHAGE) 500 MG tablet; Take 1 tablet (500 mg total) by mouth 2 (two) times daily with a meal.  Dispense: 180 tablet; Refill: 0  3. BMI 45.0-49.9, adult ([facility])  4. Obesity, Beginning BMI 48.20 Keith Brown is currently in the action stage of change. As such, his goal is to continue with weight loss efforts. He has agreed to the Category 3 Plan.   Exercise goals: As is.   Behavioral modification strategies: no skipping meals and meal planning and cooking strategies.  Keith Brown has agreed to follow-up with our clinic in 8 weeks. He was informed of the importance of frequent follow-up visits to maximize his success with intensive lifestyle  modifications for his multiple health conditions.   Objective:   Blood pressure (!) 185/72, pulse 65, temperature 98.5 F (36.9 C), height 5\' 8"  (1.727 m), weight 299 lb (135.6 kg), SpO2 96 %. Body mass index is 45.46 kg/m.  Lab Results  Component Value Date   CREATININE 1.04 07/25/2021   BUN 20 07/25/2021   NA 142 07/25/2021   K 4.1 07/25/2021   CL 105 07/25/2021   CO2 20 07/25/2021   Lab Results  Component Value Date   ALT 18 07/25/2021   AST 18 07/25/2021   ALKPHOS 77 07/25/2021   BILITOT 0.6 07/25/2021   Lab Results  Component Value Date   HGBA1C 5.4 07/25/2021   HGBA1C 5.5 01/03/2021   HGBA1C 5.5 03/26/2019   HGBA1C 5.3 06/05/2018   HGBA1C 5.6 01/23/2017   Lab Results  Component Value Date   INSULIN 19.4 07/25/2021   INSULIN 20.6 02/02/2021   Lab Results  Component Value Date   TSH 2.63 01/03/2021   Lab Results  Component Value Date   CHOL 178 07/25/2021   HDL 56 07/25/2021   LDLCALC 105 (H) 07/25/2021   TRIG 91 07/25/2021   CHOLHDL 3 01/03/2021   Lab Results  Component Value Date   VD25OH 59.5 07/25/2021   VD25OH 61.59 01/03/2021   VD25OH 59 03/31/2020   Lab Results  Component Value Date   WBC 8.8 07/25/2021   HGB 15.7 07/25/2021   HCT 46.8 07/25/2021  MCV 89 07/25/2021   PLT 156 07/25/2021   Lab Results  Component Value Date   IRON 108 03/31/2020   TIBC 307 03/31/2020   FERRITIN 451 (H) 03/31/2020   Attestation Statements:   Reviewed by clinician on day of visit: allergies, medications, problem list, medical history, surgical history, family history, social history, and previous encounter notes.   I, Burt Knack, am acting as transcriptionist for Quillian Quince, MD.  I have reviewed the above documentation for accuracy and completeness, and I agree with the above. -  Quillian Quince, MD

## 2022-05-30 ENCOUNTER — Other Ambulatory Visit: Payer: Self-pay | Admitting: *Deleted

## 2022-05-30 ENCOUNTER — Other Ambulatory Visit (HOSPITAL_COMMUNITY): Payer: Self-pay

## 2022-05-30 ENCOUNTER — Telehealth: Payer: Self-pay | Admitting: Family Medicine

## 2022-05-30 DIAGNOSIS — I10 Essential (primary) hypertension: Secondary | ICD-10-CM

## 2022-05-30 MED ORDER — LOSARTAN POTASSIUM 100 MG PO TABS: 100.0000 mg | ORAL_TABLET | Freq: Every day | ORAL | 0 refills | Status: DC

## 2022-05-30 NOTE — Telephone Encounter (Signed)
Pt is out of town and needs RX  losartan (COZAAR) 100 MG tablet  To be sent to - [facility] - [address], [place], [address]

## 2022-05-30 NOTE — Telephone Encounter (Signed)
Rx send to [facility] - [address], [place], [address]

## 2022-06-15 ENCOUNTER — Other Ambulatory Visit (HOSPITAL_COMMUNITY): Payer: Self-pay

## 2022-06-30 ENCOUNTER — Other Ambulatory Visit (HOSPITAL_COMMUNITY): Payer: Self-pay

## 2022-07-18 ENCOUNTER — Ambulatory Visit (INDEPENDENT_AMBULATORY_CARE_PROVIDER_SITE_OTHER): Payer: Medicare Other | Admitting: Family Medicine

## 2022-08-02 ENCOUNTER — Ambulatory Visit (INDEPENDENT_AMBULATORY_CARE_PROVIDER_SITE_OTHER): Payer: Medicare Other | Admitting: Family Medicine

## 2022-08-02 ENCOUNTER — Other Ambulatory Visit (HOSPITAL_COMMUNITY): Payer: Self-pay

## 2022-08-02 ENCOUNTER — Encounter (INDEPENDENT_AMBULATORY_CARE_PROVIDER_SITE_OTHER): Payer: Self-pay | Admitting: Family Medicine

## 2022-08-02 VITALS — BP 186/82 | HR 58 | Temp 99.6°F | Ht 68.0 in | Wt 297.0 lb

## 2022-08-02 DIAGNOSIS — Z6841 Body Mass Index (BMI) 40.0 and over, adult: Secondary | ICD-10-CM

## 2022-08-02 DIAGNOSIS — E669 Obesity, unspecified: Secondary | ICD-10-CM

## 2022-08-02 DIAGNOSIS — E559 Vitamin D deficiency, unspecified: Secondary | ICD-10-CM

## 2022-08-02 DIAGNOSIS — I10 Essential (primary) hypertension: Secondary | ICD-10-CM

## 2022-08-02 DIAGNOSIS — R7303 Prediabetes: Secondary | ICD-10-CM

## 2022-08-02 DIAGNOSIS — E785 Hyperlipidemia, unspecified: Secondary | ICD-10-CM

## 2022-08-02 MED ORDER — METFORMIN HCL 500 MG PO TABS
500.0000 mg | ORAL_TABLET | Freq: Two times a day (BID) | ORAL | 0 refills | Status: DC
Start: 2022-08-02 — End: 2022-09-27
  Filled 2022-08-02 – 2022-09-08 (×2): qty 180, 90d supply, fill #0

## 2022-08-02 NOTE — Progress Notes (Addendum)
[facility]  Office: [phone]  /  Fax: [phone]  WEIGHT SUMMARY AND BIOMETRICS  Anthropometric Measurements Height: 5\' 8"  (1.727 m) Weight: 297 lb (134.7 kg) BMI (Calculated): 45.17 Weight at Last Visit: 299 lb Weight Lost Since Last Visit: 2 lb Weight Gained Since Last Visit: 0 Starting Weight: 317 lb Total Weight Loss (lbs): 20 lb (9.072 kg)   Body Composition  Body Fat %: 42.1 % Fat Mass (lbs): 125.2 lbs Muscle Mass (lbs): 164 lbs Total Body Water (lbs): 132.6 lbs Visceral Fat Rating : 30   Other Clinical Data Fasting: No Labs: No Today's Visit #: 22 Starting Date: 02/02/21    Chief Complaint: OBESITY     History of Present Illness   The patient, a 67 year old male with a history of hypertension, presented for a follow-up visit after approximately ten weeks. The patient reported a weight loss of two pounds during this period, achieved by intermittently following a category three diet plan. Despite the weight loss, he reported no increase in physical activity.  The patient's blood pressure was noted to be uncontrolled at 187/83, and a repeat measurement confirmed the elevated levels. The patient is currently on hydrochlorothiazide 25 mg and losartan 100 mg, prescribed by his primary care physician.  The patient also discussed a recent vacation, during which he experienced difficulty with physical activity due to high temperatures and humidity. Despite these challenges, he reported no shortness of breath or other distressing symptoms.  The patient also reported a change in his medication schedule, now taking his medication in the morning and at night, which he believes has helped manage his dietary cravings. He also noted that his blood pressure was elevated during a recent dental visit.  The patient also discussed his living situation, noting that he and his spouse maintain separate residences. He reported that this arrangement has been beneficial for his lifestyle  and dietary habits. 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  The patient's recent labs showed an A1c of 5.4, indicating good glycemic control. His vitamin D levels were within the normal range, and his LDL was slightly elevated at 105. The patient also mentioned a previous CT scan that showed a slight dilatation, which he is aware could be problematic with his elevated blood pressure.          PHYSICAL EXAM:  Blood pressure (!) 186/82, pulse (!) 58, temperature 99.6 F (37.6 C), height 5\' 8"  (1.727 m), weight 297 lb (134.7 kg), SpO2 97%. Body mass index is 45.16 kg/m.  DIAGNOSTIC DATA REVIEWED:  BMET    Component Value Date/Time   NA 142 07/25/2021 0742   K 4.1 07/25/2021 0742   CL 105 07/25/2021 0742   CO2 20 07/25/2021 0742   GLUCOSE 101 (H) 07/25/2021 0742   GLUCOSE 90 01/03/2021 1022   BUN 20 07/25/2021 0742   CREATININE 1.04 07/25/2021 0742   CREATININE 0.99 06/05/2018 1352   CALCIUM 9.8 07/25/2021 0742   GFRNONAA 43 (L) 02/16/2016 1839   GFRAA 49 (L) 02/16/2016 1839   Lab Results  Component Value Date   HGBA1C 5.4 07/25/2021   HGBA1C 5.6 01/23/2017   Lab Results  Component Value Date   INSULIN 19.4 07/25/2021   INSULIN 20.6 02/02/2021   Lab Results  Component Value Date   TSH 2.63 01/03/2021   CBC    Component Value Date/Time   WBC 8.8 07/25/2021 0742   WBC 8.4 01/03/2021 1022   RBC 5.25 07/25/2021  0742   RBC 5.36 01/03/2021 1022   HGB 15.7 07/25/2021 0742   HCT 46.8 07/25/2021 0742   PLT 156 07/25/2021 0742   MCV 89 07/25/2021 0742   MCH 29.9 07/25/2021 0742   MCH 29.4 03/31/2020 1510   MCHC 33.5 07/25/2021 0742   MCHC 33.6 01/03/2021 1022   RDW 13.1 07/25/2021 0742   Iron Studies    Component Value Date/Time   IRON 108 03/31/2020 1510   TIBC 307 03/31/2020 1510   FERRITIN 451 (H) 03/31/2020 1510   IRONPCTSAT 35 03/31/2020 1510   Lipid Panel     Component Value Date/Time    CHOL 178 07/25/2021 0742   TRIG 91 07/25/2021 0742   HDL 56 07/25/2021 0742   CHOLHDL 3 01/03/2021 1022   VLDL 18.6 01/03/2021 1022   LDLCALC 105 (H) 07/25/2021 0742   LDLCALC 104 (H) 06/05/2018 1352   Hepatic Function Panel     Component Value Date/Time   PROT 7.2 07/25/2021 0742   ALBUMIN 4.6 07/25/2021 0742   AST 18 07/25/2021 0742   ALT 18 07/25/2021 0742   ALKPHOS 77 07/25/2021 0742   BILITOT 0.6 07/25/2021 0742   BILIDIR 0.2 01/03/2007 0000      Component Value Date/Time   TSH 2.63 01/03/2021 1022   Nutritional Lab Results  Component Value Date   VD25OH 59.5 07/25/2021   VD25OH 61.59 01/03/2021   VD25OH 59 03/31/2020     Assessment and Plan    Hypertension: Uncontrolled blood pressure readings of 187/83 and 186/82 despite current treatment with Hydrochlorothiazide 25mg  and Losartan 100mg . Discussed the need for further evaluation by PCP, Dr. Jimmey Ralph, for potential causes and treatment adjustments. -Schedule appointment with Dr. Jimmey Ralph for further evaluation and management.  Obesity: Successful weight loss of 2 pounds over the past 10 weeks following the category three plan intermittently. Discussed the benefits of continued adherence to the plan and the importance of regular physical activity. -Continue with the category three plan and increase physical activity as tolerated.  Prediabetes: Stable with current treatment of Metformin. A1c at 5.4 indicating good glycemic control. -Refill Metformin for 90 days.  HLD: LDL slightly elevated at 105, down from 119. Vitamin D levels within normal range. Kidney function normal. -Continue current lifestyle modifications for LDL management.   Vit D deficiency: stable on vit D supplement -Continue current Vitamin D supplementation regimen and will follow closely.         I have personally spent 42 minutes total time today in preparation, patient care, and documentation for this visit, including the following: review of  clinical lab tests; review of medical tests/procedures/services.    He was informed of the importance of frequent follow up visits to maximize his success with intensive lifestyle modifications for his multiple health conditions.    Quillian Quince, MD

## 2022-08-09 IMAGING — CT CT CARDIAC CORONARY ARTERY CALCIUM SCORE
3 series · 13 of 20 positions shown, 15 images · non-contrast
Comparison: 09/11/2011 chest radiograph

Addendum:
CLINICAL DATA: Risk stratification: 64 Year-old White Male

EXAM:
Coronary Calcium Score
TECHNIQUE: The patient was scanned on a Siemens Force scanner. Axial
non-contrast 3 mm slices were carried out through the heart. The
data set was analyzed on a dedicated work station and scored using
the Agatson method.

[Series 3: 2 hrt calcium · axial · 0.46mm/px · z∈[-278,-197]mm · 5 of 41 slices shown, 7 images]
[im 7/41  vessel]
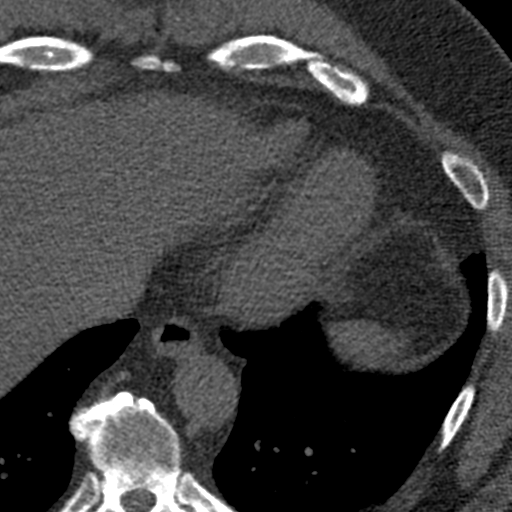
[im 7/41  lung]
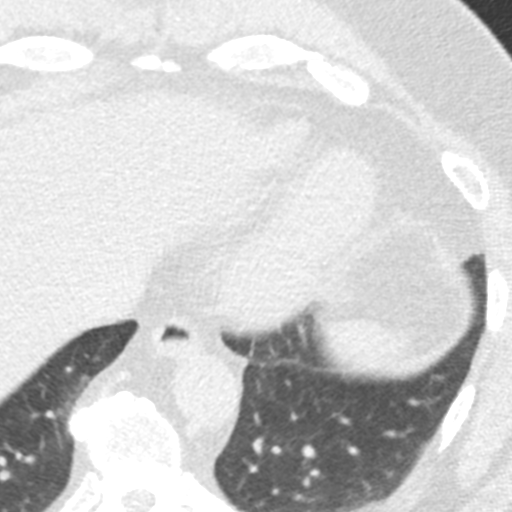
[im 14/41  vessel]
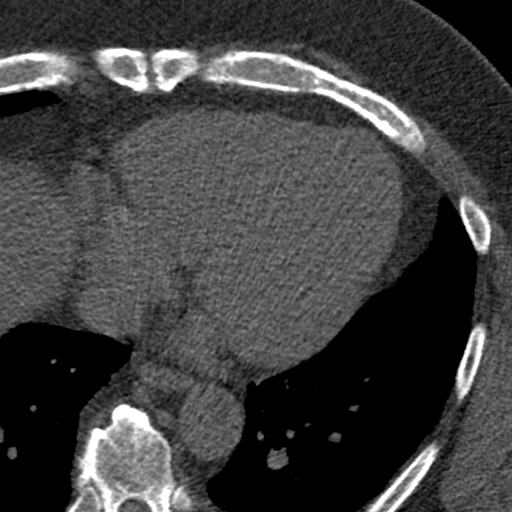
[im 21/41  vessel]
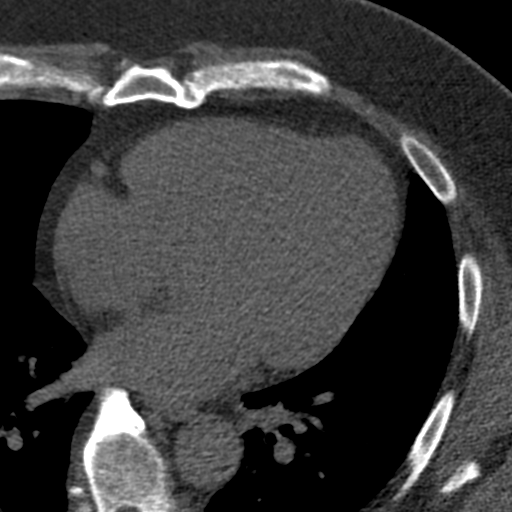
[im 27/41  vessel]
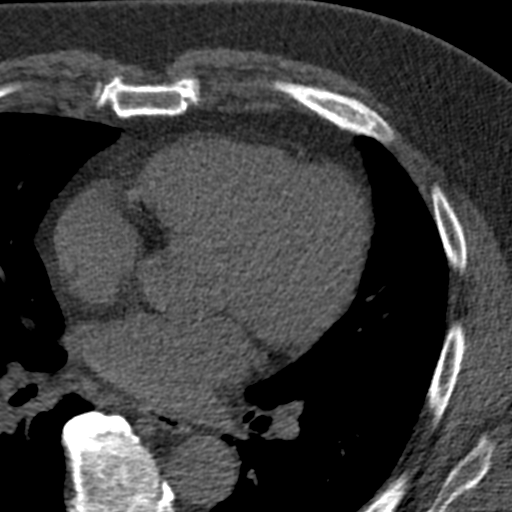
[im 34/41  vessel]
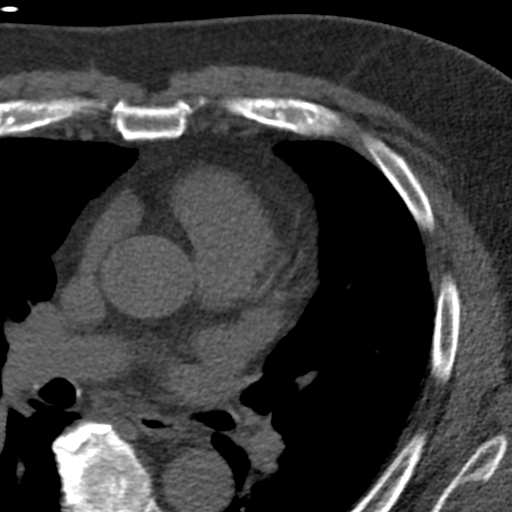
[im 34/41  lung]
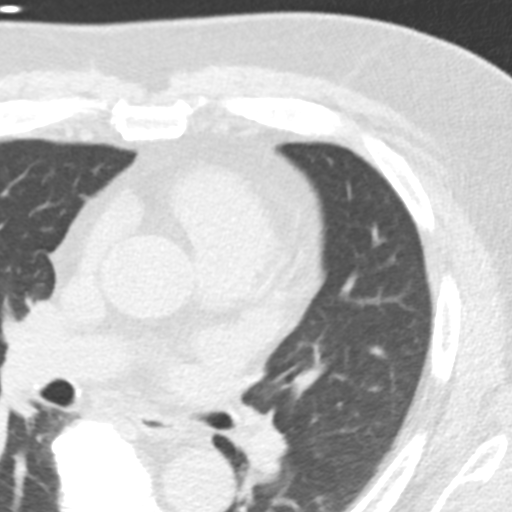

[Series 4: 2 soft full fov 76 % · axial · 0.74mm/px · z∈[-276,-238]mm · 3 of 34 slices shown]
[im 7/34  vessel]
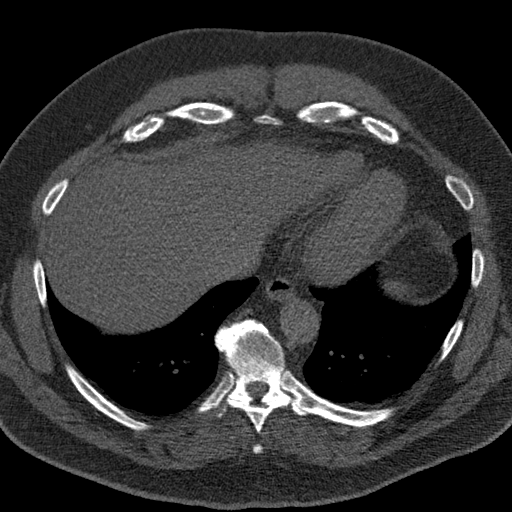
[im 14/34  vessel]
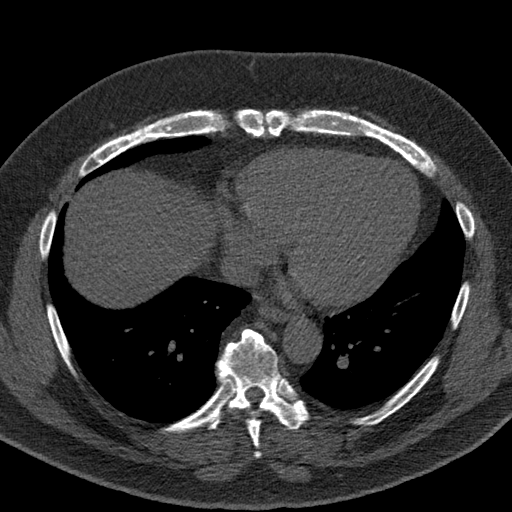
[im 20/34  vessel]
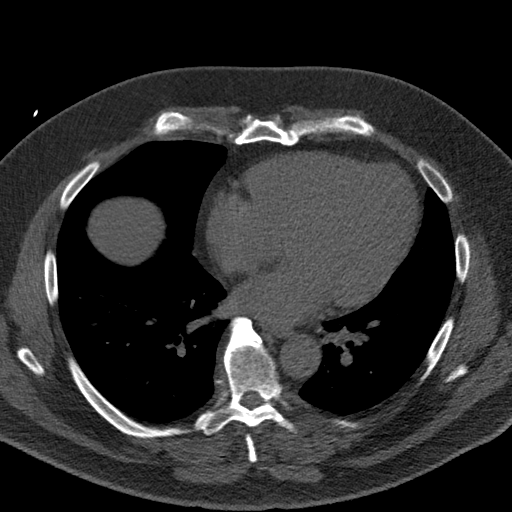

[Series 6: 2 lungs 76 % · axial · 0.74mm/px · z∈[-278,-200]mm · 5 of 40 slices shown]
[im 7/40  vessel]
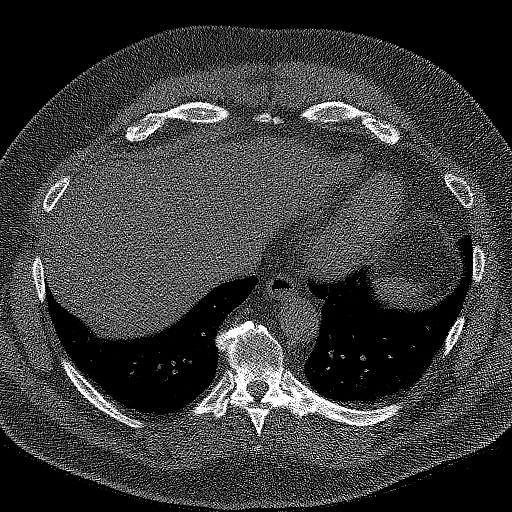
[im 14/40  vessel]
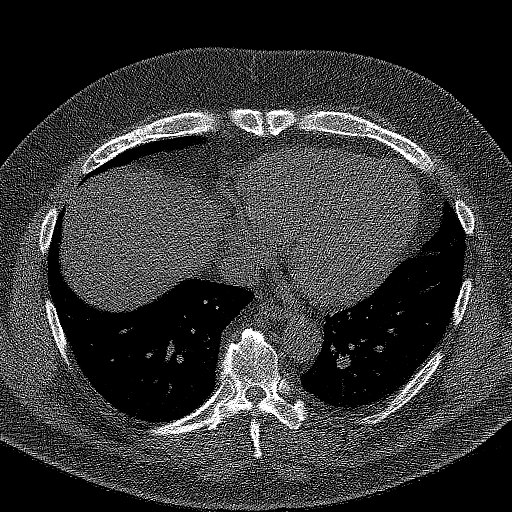
[im 20/40  vessel]
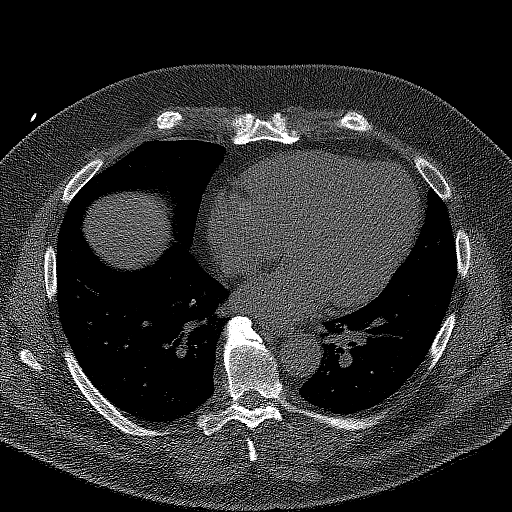
[im 27/40  vessel]
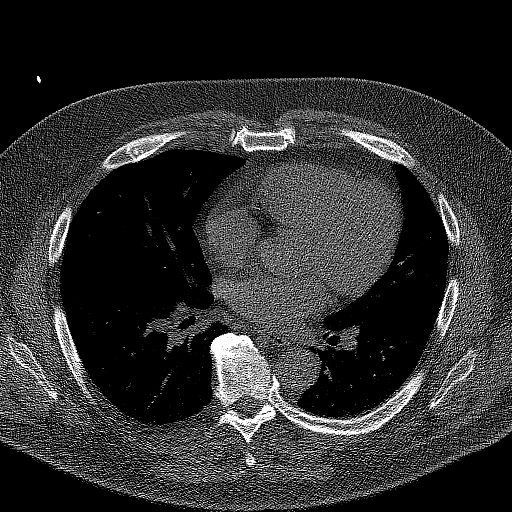
[im 33/40  vessel]
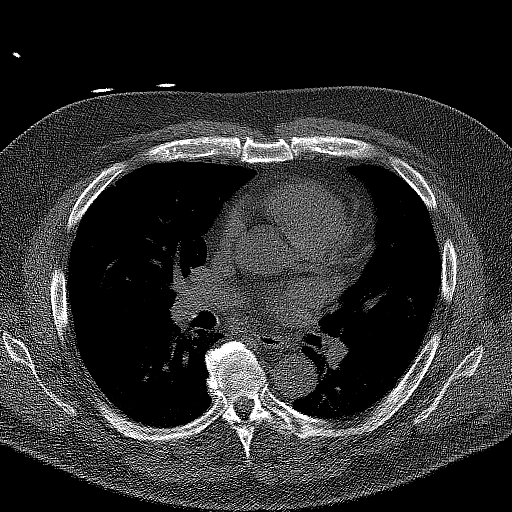

[13 of 20 positions shown; findings below may reference images not displayed]

FINDINGS: Non-cardiac: See separate report from [REDACTED].

Ascending Aorta: Mild dilation 41 mm on non contrasted study.

Pericardium: Normal.

Coronary arteries: Normal origins.

Coronary Calcium Score:

Left main: 0

Left anterior descending artery: 0

Left circumflex artery: 0

Right coronary artery: 15

Total: 15

Percentile: 33rd for age, sex, and race matched control.
IMPRESSION: 1. Coronary calcium score of 15. This was 33rd percentile for age,
gender, and race matched controls.

2. Ascending Aorta: Mild dilation 41 mm on non contrasted study.
Consider secondary imaging modality such as CT with contrast or
echocardiogram to clarify size with more specific modality.

RECOMMENDATIONS:

Coronary artery calcium (CAC) score is a strong predictor of
incident coronary heart disease (CHD) and provides predictive
information beyond traditional risk factors. CAC scoring is
reasonable to use in the decision to withhold, postpone, or initiate
statin therapy in intermediate-risk or selected borderline-risk
asymptomatic adults (age 40-75 years and LDL-C ?70 to <190 mg/dL)
who do not have diabetes or established atherosclerotic
cardiovascular disease (ASCVD).* In intermediate-risk (10-year ASCVD
risk ?7.5% to <20%) adults or selected borderline-risk (10-year
ASCVD risk ?5% to <7.5%) adults in whom a CAC score is measured for
the purpose of making a treatment decision the following
recommendations have been made:

If CAC = 0, it is reasonable to withhold statin therapy and reassess
in 5 to 10 years, as long as higher risk conditions are absent
(diabetes mellitus, family history of premature CHD in first degree
relatives (males <55 years; females <65 years), cigarette smoking,
LDL ?190 mg/dL or other independent risk factors).

If CAC is 1 to 99, it is reasonable to initiate statin therapy for
patients ?55 years of age.

If CAC is ?100 or ?75th percentile, it is reasonable to initiate
statin therapy at any age.

Cardiology referral should be considered for patients with CAC
scores ?400 or ?75th percentile.

*8472 AHA/ACC/AACVPR/AAPA/ABC/MICHIKA/ANYA/ARISSA/Quirijn/GREIDIS/ALBANI/SHARINA
Guideline on the Management of Blood Cholesterol: A Report of the
American College of Cardiology/American Heart Association Task Force
on Clinical Practice Guidelines. J Am Coll Cardiol.
6829;73(24):1197-1266.

ADDENDUM:
OVER-READ INTERPRETATION  CT CHEST

The following report is an over-read performed by radiologist Dr.
does not include interpretation of cardiac or coronary anatomy or
pathology. The carcinoma score interpretation by the cardiologist is
attached.
FINDINGS: No pleural fluid.  Clear imaged lungs.

Normal aortic caliber.  No imaged thoracic adenopathy.

Mild hepatic steatosis.

Normal imaged portions of the spleen.

No acute osseous abnormality.  Midthoracic spondylosis.
IMPRESSION: 1.  No acute findings in the imaged extracardiac chest.
2. Mild hepatic steatosis.

*** End of Addendum ***
FINDINGS: Non-cardiac: See separate report from [REDACTED].

Ascending Aorta: Mild dilation 41 mm on non contrasted study.

Pericardium: Normal.

Coronary arteries: Normal origins.

Coronary Calcium Score:

Left main: 0

Left anterior descending artery: 0

Left circumflex artery: 0

Right coronary artery: 15

Total: 15

Percentile: 33rd for age, sex, and race matched control.
IMPRESSION: 1. Coronary calcium score of 15. This was 33rd percentile for age,
gender, and race matched controls.

2. Ascending Aorta: Mild dilation 41 mm on non contrasted study.
Consider secondary imaging modality such as CT with contrast or
echocardiogram to clarify size with more specific modality.

RECOMMENDATIONS:

Coronary artery calcium (CAC) score is a strong predictor of
incident coronary heart disease (CHD) and provides predictive
information beyond traditional risk factors. CAC scoring is
reasonable to use in the decision to withhold, postpone, or initiate
statin therapy in intermediate-risk or selected borderline-risk
asymptomatic adults (age 40-75 years and LDL-C ?70 to <190 mg/dL)
who do not have diabetes or established atherosclerotic
cardiovascular disease (ASCVD).* In intermediate-risk (10-year ASCVD
risk ?7.5% to <20%) adults or selected borderline-risk (10-year
ASCVD risk ?5% to <7.5%) adults in whom a CAC score is measured for
the purpose of making a treatment decision the following
recommendations have been made:

If CAC = 0, it is reasonable to withhold statin therapy and reassess
in 5 to 10 years, as long as higher risk conditions are absent
(diabetes mellitus, family history of premature CHD in first degree
relatives (males <55 years; females <65 years), cigarette smoking,
LDL ?190 mg/dL or other independent risk factors).

If CAC is 1 to 99, it is reasonable to initiate statin therapy for
patients ?55 years of age.

If CAC is ?100 or ?75th percentile, it is reasonable to initiate
statin therapy at any age.

Cardiology referral should be considered for patients with CAC
scores ?400 or ?75th percentile.

*8472 AHA/ACC/AACVPR/AAPA/ABC/MICHIKA/ANYA/ARISSA/Quirijn/GREIDIS/ALBANI/SHARINA
Guideline on the Management of Blood Cholesterol: A Report of the
American College of Cardiology/American Heart Association Task Force
on Clinical Practice Guidelines. J Am Coll Cardiol.
6829;73(24):1197-1266.

## 2022-08-16 ENCOUNTER — Encounter (INDEPENDENT_AMBULATORY_CARE_PROVIDER_SITE_OTHER): Payer: Self-pay

## 2022-08-31 ENCOUNTER — Encounter: Payer: No Typology Code available for payment source | Admitting: Family Medicine

## 2022-09-07 ENCOUNTER — Other Ambulatory Visit (HOSPITAL_COMMUNITY): Payer: Self-pay

## 2022-09-07 ENCOUNTER — Other Ambulatory Visit (INDEPENDENT_AMBULATORY_CARE_PROVIDER_SITE_OTHER): Payer: Self-pay | Admitting: Family Medicine

## 2022-09-07 DIAGNOSIS — I10 Essential (primary) hypertension: Secondary | ICD-10-CM

## 2022-09-07 MED ORDER — LOSARTAN POTASSIUM 100 MG PO TABS
100.0000 mg | ORAL_TABLET | Freq: Every day | ORAL | 0 refills | Status: DC
Start: 2022-09-07 — End: 2022-11-30
  Filled 2022-09-07: qty 90, 90d supply, fill #0

## 2022-09-08 ENCOUNTER — Other Ambulatory Visit: Payer: Self-pay

## 2022-09-08 ENCOUNTER — Other Ambulatory Visit (HOSPITAL_COMMUNITY): Payer: Self-pay

## 2022-09-27 ENCOUNTER — Ambulatory Visit (INDEPENDENT_AMBULATORY_CARE_PROVIDER_SITE_OTHER): Payer: Medicare Other | Admitting: Family Medicine

## 2022-09-27 ENCOUNTER — Encounter (INDEPENDENT_AMBULATORY_CARE_PROVIDER_SITE_OTHER): Payer: Self-pay | Admitting: Family Medicine

## 2022-09-27 ENCOUNTER — Other Ambulatory Visit (HOSPITAL_COMMUNITY): Payer: Self-pay

## 2022-09-27 VITALS — BP 175/77 | HR 61 | Temp 98.2°F | Ht 68.0 in | Wt 300.0 lb

## 2022-09-27 DIAGNOSIS — I10 Essential (primary) hypertension: Secondary | ICD-10-CM | POA: Diagnosis not present

## 2022-09-27 DIAGNOSIS — G4733 Obstructive sleep apnea (adult) (pediatric): Secondary | ICD-10-CM | POA: Diagnosis not present

## 2022-09-27 DIAGNOSIS — Z6841 Body Mass Index (BMI) 40.0 and over, adult: Secondary | ICD-10-CM

## 2022-09-27 DIAGNOSIS — R7303 Prediabetes: Secondary | ICD-10-CM | POA: Diagnosis not present

## 2022-09-27 DIAGNOSIS — E669 Obesity, unspecified: Secondary | ICD-10-CM

## 2022-09-27 MED ORDER — METFORMIN HCL 500 MG PO TABS
500.0000 mg | ORAL_TABLET | Freq: Two times a day (BID) | ORAL | 0 refills | Status: DC
Start: 2022-09-27 — End: 2022-11-22
  Filled 2022-09-27: qty 180, 90d supply, fill #0

## 2022-10-04 NOTE — Progress Notes (Unsigned)
Chief Complaint:   OBESITY Keith Brown is here to discuss his progress with his obesity treatment plan along with follow-up of his obesity related diagnoses. Keith Brown is on the Category 3 Plan and states he is following his eating plan approximately 30% of the time. Keith Brown states he is walking for 30 minutes 3 times per week.  Today's visit was #: 23 Starting weight: 317 lbs Starting date: 02/02/2021 Today's weight: 300 lbs Today's date: 10/04/2022 Total lbs lost to date: 17 Total lbs lost since last in-office visit: 0  Interim History: Patient has been struggling to follow his eating plan.  He is happily married now, but he has gained approximately 19 pounds over the last year.  He is walking 3 times per week and doing his own yard work.  Subjective:   1. Essential hypertension Patient's blood pressure is dangerously elevated.  He is on losartan and hydrochlorothiazide.  He was recently diagnosed with obstructive sleep apnea.  2. Prediabetes Patient is on metformin, but he is only taking it once daily.  He has some loose stools with his twice daily dose.  3. OSA (obstructive sleep apnea) Patient was diagnosed recently by his dentist.  He plans on trying a mouth appliance.  Assessment/Plan:   1. Essential hypertension Patient was referred to the advanced hypertension clinic, and will contact the patient through MyChart.  He will work on taking hydrochlorothiazide regularly.  - Ambulatory referral to [facility]  2. Prediabetes Patient will continue with his diet and metformin, and we will refill metformin 500 mg twice daily for 90 days.  - metFORMIN (GLUCOPHAGE) 500 MG tablet; Take 1 tablet (500 mg total) by mouth 2 (two) times daily with a meal.  Dispense: 180 tablet; Refill: 0  3. OSA (obstructive sleep apnea) Patient is to work on his weight loss and start using a mouth appliance.  4. BMI 45.0-49.9, adult (HCC)  5. Obesity, Beginning BMI 48.20 Keith Brown is  currently in the action stage of change. As such, his goal is to continue with weight loss efforts. He has agreed to the Category 3 Plan.   Exercise goals: Patient is to work on increasing his exercise. For substantial health benefits, adults should do at least 150 minutes (2 hours and 30 minutes) a week of moderate-intensity, or 75 minutes (1 hour and 15 minutes) a week of vigorous-intensity aerobic physical activity, or an equivalent combination of moderate- and vigorous-intensity aerobic activity. Aerobic activity should be performed in episodes of at least 10 minutes, and preferably, it should be spread throughout the week.  Behavioral modification strategies: increasing lean protein intake.  Keith Brown has agreed to follow-up with our clinic in 8 weeks. He was informed of the importance of frequent follow-up visits to maximize his success with intensive lifestyle modifications for his multiple health conditions.   Objective:   Blood pressure (!) 175/77, pulse 61, temperature 98.2 F (36.8 C), height 5\' 8"  (1.727 m), weight 300 lb (136.1 kg), SpO2 98%. Body mass index is 45.61 kg/m.  Lab Results  Component Value Date   CREATININE 1.04 07/25/2021   BUN 20 07/25/2021   NA 142 07/25/2021   K 4.1 07/25/2021   CL 105 07/25/2021   CO2 20 07/25/2021   Lab Results  Component Value Date   ALT 18 07/25/2021   AST 18 07/25/2021   ALKPHOS 77 07/25/2021   BILITOT 0.6 07/25/2021   Lab Results  Component Value Date   HGBA1C 5.4 07/25/2021   HGBA1C 5.5  01/03/2021   HGBA1C 5.5 03/26/2019   HGBA1C 5.3 06/05/2018   HGBA1C 5.6 01/23/2017   Lab Results  Component Value Date   INSULIN 19.4 07/25/2021   INSULIN 20.6 02/02/2021   Lab Results  Component Value Date   TSH 2.63 01/03/2021   Lab Results  Component Value Date   CHOL 178 07/25/2021   HDL 56 07/25/2021   LDLCALC 105 (H) 07/25/2021   TRIG 91 07/25/2021   CHOLHDL 3 01/03/2021   Lab Results  Component Value Date   VD25OH 59.5  07/25/2021   VD25OH 61.59 01/03/2021   VD25OH 59 03/31/2020   Lab Results  Component Value Date   WBC 8.8 07/25/2021   HGB 15.7 07/25/2021   HCT 46.8 07/25/2021   MCV 89 07/25/2021   PLT 156 07/25/2021   Lab Results  Component Value Date   IRON 108 03/31/2020   TIBC 307 03/31/2020   FERRITIN 451 (H) 03/31/2020   Attestation Statements:   Reviewed by clinician on day of visit: allergies, medications, problem list, medical history, surgical history, family history, social history, and previous encounter notes.   I, Burt Knack, am acting as transcriptionist for Quillian Quince, MD.  I have reviewed the above documentation for accuracy and completeness, and I agree with the above. -  Quillian Quince, MD

## 2022-10-23 ENCOUNTER — Other Ambulatory Visit (HOSPITAL_COMMUNITY): Payer: Self-pay

## 2022-10-23 ENCOUNTER — Other Ambulatory Visit (INDEPENDENT_AMBULATORY_CARE_PROVIDER_SITE_OTHER): Payer: Self-pay | Admitting: Family Medicine

## 2022-10-23 DIAGNOSIS — E790 Hyperuricemia without signs of inflammatory arthritis and tophaceous disease: Secondary | ICD-10-CM

## 2022-10-23 MED ORDER — ALLOPURINOL 300 MG PO TABS
300.0000 mg | ORAL_TABLET | Freq: Every day | ORAL | 3 refills | Status: DC
Start: 2022-10-23 — End: 2023-11-12
  Filled 2022-10-23: qty 90, 90d supply, fill #0
  Filled 2023-02-11: qty 90, 90d supply, fill #1
  Filled 2023-05-14: qty 90, 90d supply, fill #2
  Filled 2023-08-10: qty 90, 90d supply, fill #3

## 2022-11-09 ENCOUNTER — Other Ambulatory Visit (HOSPITAL_COMMUNITY): Payer: Self-pay

## 2022-11-09 ENCOUNTER — Encounter: Payer: Self-pay | Admitting: Family Medicine

## 2022-11-09 ENCOUNTER — Ambulatory Visit: Payer: Medicare Other | Admitting: Family Medicine

## 2022-11-09 VITALS — BP 179/96 | HR 53 | Temp 97.8°F | Ht 68.0 in | Wt 296.8 lb

## 2022-11-09 DIAGNOSIS — Z125 Encounter for screening for malignant neoplasm of prostate: Secondary | ICD-10-CM | POA: Diagnosis not present

## 2022-11-09 DIAGNOSIS — E785 Hyperlipidemia, unspecified: Secondary | ICD-10-CM | POA: Diagnosis not present

## 2022-11-09 DIAGNOSIS — I10 Essential (primary) hypertension: Secondary | ICD-10-CM

## 2022-11-09 DIAGNOSIS — E559 Vitamin D deficiency, unspecified: Secondary | ICD-10-CM | POA: Diagnosis not present

## 2022-11-09 DIAGNOSIS — G4733 Obstructive sleep apnea (adult) (pediatric): Secondary | ICD-10-CM

## 2022-11-09 DIAGNOSIS — L578 Other skin changes due to chronic exposure to nonionizing radiation: Secondary | ICD-10-CM

## 2022-11-09 DIAGNOSIS — Z23 Encounter for immunization: Secondary | ICD-10-CM

## 2022-11-09 DIAGNOSIS — M109 Gout, unspecified: Secondary | ICD-10-CM

## 2022-11-09 DIAGNOSIS — R7303 Prediabetes: Secondary | ICD-10-CM

## 2022-11-09 DIAGNOSIS — R351 Nocturia: Secondary | ICD-10-CM

## 2022-11-09 DIAGNOSIS — N529 Male erectile dysfunction, unspecified: Secondary | ICD-10-CM

## 2022-11-09 DIAGNOSIS — E790 Hyperuricemia without signs of inflammatory arthritis and tophaceous disease: Secondary | ICD-10-CM

## 2022-11-09 DIAGNOSIS — Z0000 Encounter for general adult medical examination without abnormal findings: Secondary | ICD-10-CM

## 2022-11-09 LAB — COMPREHENSIVE METABOLIC PANEL
ALT: 18 U/L (ref 0–53)
AST: 19 U/L (ref 0–37)
Albumin: 4.2 g/dL (ref 3.5–5.2)
Alkaline Phosphatase: 61 U/L (ref 39–117)
BUN: 18 mg/dL (ref 6–23)
CO2: 27 meq/L (ref 19–32)
Calcium: 9.6 mg/dL (ref 8.4–10.5)
Chloride: 108 meq/L (ref 96–112)
Creatinine, Ser: 0.92 mg/dL (ref 0.40–1.50)
GFR: 86.27 mL/min (ref >60.00)
Glucose, Bld: 105 mg/dL — ABNORMAL HIGH (ref 70–99)
Potassium: 4.4 meq/L (ref 3.5–5.1)
Sodium: 140 meq/L (ref 135–145)
Total Bilirubin: 0.8 mg/dL (ref 0.2–1.2)
Total Protein: 6.6 g/dL (ref 6.0–8.3)

## 2022-11-09 LAB — CBC
HCT: 46.9 % (ref 39.0–52.0)
Hemoglobin: 15.1 g/dL (ref 13.0–17.0)
MCHC: 32.1 g/dL (ref 30.0–36.0)
MCV: 90.5 fL (ref 78.0–100.0)
Platelets: 143 10*3/uL — ABNORMAL LOW (ref 150.0–400.0)
RBC: 5.18 Mil/uL (ref 4.22–5.81)
RDW: 14.4 % (ref 11.5–15.5)
WBC: 6.8 10*3/uL (ref 4.0–10.5)

## 2022-11-09 LAB — URIC ACID: Uric Acid, Serum: 5.6 mg/dL (ref 4.0–7.8)

## 2022-11-09 LAB — LIPID PANEL
Cholesterol: 166 mg/dL (ref 0–200)
HDL: 52.9 mg/dL (ref >39.00)
LDL Cholesterol: 101 mg/dL — ABNORMAL HIGH (ref 0–99)
NonHDL: 113.07
Total CHOL/HDL Ratio: 3
Triglycerides: 58 mg/dL (ref 0.0–149.0)
VLDL: 11.6 mg/dL (ref 0.0–40.0)

## 2022-11-09 LAB — TSH: TSH: 2.26 u[IU]/mL (ref 0.35–5.50)

## 2022-11-09 LAB — VITAMIN D 25 HYDROXY (VIT D DEFICIENCY, FRACTURES): VITD: 39.95 ng/mL (ref 30.00–100.00)

## 2022-11-09 LAB — TESTOSTERONE: Testosterone: 350.54 ng/dL (ref 300.00–890.00)

## 2022-11-09 LAB — PSA: PSA: 1.41 ng/mL (ref 0.10–4.00)

## 2022-11-09 LAB — HEMOGLOBIN A1C: Hgb A1c MFr Bld: 5.4 % (ref 4.6–6.5)

## 2022-11-09 MED ORDER — AMLODIPINE BESYLATE 5 MG PO TABS
5.0000 mg | ORAL_TABLET | Freq: Every day | ORAL | 3 refills | Status: DC
  Filled 2022-11-09: qty 90, 90d supply, fill #0
  Filled 2023-02-20: qty 90, 90d supply, fill #1

## 2022-11-09 NOTE — Assessment & Plan Note (Addendum)
Above goal today. Will add on amlodipine 5 mg daily. Continue losartan 100 mg daily.  He has been referred to advanced hypertension clinic by medical weight management and will follow-up with them in a few weeks.

## 2022-11-09 NOTE — Progress Notes (Signed)
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    Assessment/Plan:  Chronic Problems Addressed Today: Prediabetes Following with medical weight management. On metformin 500 mg twice daily. Check A1c.   Vitamin D deficiency Check Vitamin D.   Essential hypertension Above goal today. Will add on amlodipine 5 mg daily. Continue losartan 100 mg daily.  He has been referred to advanced hypertension clinic by medical weight management and will follow-up with them in a few weeks.  Dyslipidemia Check lipids. He is working with [facility] for lifestyle modifications.   Gout Check uric acid. Continue allopurinol 300 mg daily.   Sun-damaged skin We will refer to dermatology.   Preventative Healthcare: Check labs. Will give flu shot today. Needs colonoscopy - will refer to gastroenterology for this.  He will get shingles and COVID-vaccine at the pharmacy.  Patient Counseling(The following topics were reviewed and/or handout was given):  -Nutrition: Stressed importance of moderation in sodium/caffeine intake, saturated fat and cholesterol, caloric balance, sufficient intake of fresh fruits, vegetables, and fiber.  -Stressed the importance of regular exercise.   -Substance Abuse: Discussed cessation/primary prevention of tobacco, alcohol, or other drug use; driving or other dangerous activities under the influence; availability of treatment for abuse.   -Injury prevention: Discussed safety belts, safety helmets, smoke detector, smoking near bedding or upholstery.   -Sexuality: Discussed sexually transmitted diseases, partner selection, use of condoms, avoidance of unintended pregnancy and contraceptive alternatives.   -Dental health: Discussed importance of regular tooth brushing, flossing, and dental visits.  -Health maintenance and immunizations reviewed. Please refer to Health maintenance section.  Return to care in 1 year for  next preventative visit.     Subjective:  HPI:  He has no acute complaints today. See Assessment / plan for status of chronic conditions.   Lifestyle Diet: Balanced.  Try to get plenty of fruits and veggies. Working with [facility].  Exercise: Trying to walk.      11/09/2022    8:19 AM  Depression screen PHQ 2/9  Decreased Interest 0  Down, Depressed, Hopeless 0  PHQ - 2 Score 0    Health Maintenance Due  Topic Date Due   Medicare Annual Wellness (AWV)  Never done   Colonoscopy  03/14/2022     ROS: Per HPI, otherwise a complete review of systems was negative.   PMH:  The following were reviewed and entered/updated in epic: Past Medical History:  Diagnosis Date   Acute meniscal tear of left knee    Allergy    fire ants   Back pain    Complication of anesthesia    Edema of both lower extremities    Fatigue    History of kidney stones    Hyperlipidemia    Hypertension    Joint pain    Osteoarthritis    PONV (postoperative nausea and vomiting)    Sleep apnea    SOB (shortness of breath) on exertion    Vitamin D deficiency    Patient Active Problem List   Diagnosis Date Noted   OSA (obstructive sleep apnea) 09/27/2022   Obesity, Beginning BMI 48.20 03/14/2022   BMI 45.0-49.9, adult (HCC) 02/07/2022   Obesity, Beginning BMI 48.20 02/07/2022   Stress 11/09/2021   Prediabetes 10/12/2021   Class 3 severe obesity with serious comorbidity and body mass index (BMI) of 45.0 to 49.9 in adult [facility]) 09/14/2021   Erectile dysfunction 08/29/2021   Sun-damaged skin  08/29/2021   Insulin resistance 07/25/2021   Gout 07/25/2021   Essential hypertension 01/03/2021   Dyslipidemia 01/03/2021   BPH (benign prostatic hyperplasia) 01/07/2020   Vitamin D deficiency 06/06/2018   Hyperuricemia 05/09/2016   Family history of colon cancer 05/09/2016   Left medial knee pain 03/12/2015   History of colonic polyps 09/11/2011   NEPHROLITHIASIS, HX OF 08/20/2008   Osteoarthritis  01/03/2007   LUMBAR DISC DISORDER 01/03/2007   Past Surgical History:  Procedure Laterality Date   BACK SURGERY     laminectomy L5-S1   COLONOSCOPY WITH PROPOFOL N/A 03/14/2017   Procedure: COLONOSCOPY WITH PROPOFOL;  Surgeon: Vida Rigger, MD;  Location: [facility];  Service: Endoscopy;  Laterality: N/A;   Colonscopy     polyps   CYSTOSCOPY KIDNEY W/ URETERAL GUIDE WIRE  2010   lithotripsy   CYSTOSCOPY/RETROGRADE/URETEROSCOPY/STONE EXTRACTION WITH BASKET Right 02/16/2016   Procedure: CYSTOSCOPY/RETROGRADE/RIGHT FLEXIBLE URETEROSCOPY/STONE EXTRACTION WITH BASKET;  Surgeon: Heloise Purpura, MD;  Location: [facility];  Service: Urology;  Laterality: Right;   HOLMIUM LASER APPLICATION Left 02/16/2016   Procedure: HOLMIUM LASER APPLICATION;  Surgeon: Heloise Purpura, MD;  Location: [facility];  Service: Urology;  Laterality: Left;   KNEE ARTHROSCOPY Right 2013   KNEE ARTHROSCOPY WITH MEDIAL MENISECTOMY Left 03/12/2015   Procedure: LEFT KNEE ARTHROSCOPY WITH PARTIAL MEDIAL MENISCECTOMY;  Surgeon: Kathryne Hitch, MD;  Location: [facility];  Service: Orthopedics;  Laterality: Left;   LAMINECTOMY  1999   MR HIPS BILATERAL     2009&2010   TOTAL HIP REVISION  09/15/2011   Procedure: TOTAL HIP REVISION;  Surgeon: Kathryne Hitch, MD;  Location: [facility];  Service: Orthopedics;  Laterality: Right;  Revision right hip acetabular component    Family History  Problem Relation Age of Onset   Obesity Mother    Hypertension Mother    Diabetes Mother    Stroke Mother    Kidney failure Mother    Heart disease Mother    Obesity Father    Hypertension Father    Colon cancer Father    Cancer Father    Anemia Father    Diabetes Father    Heart attack Sister    Obesity Sister    Prostatitis Brother    Heart failure Brother    Sleep apnea Brother    Obesity Brother    Obesity Brother    Obesity Brother    Obesity Brother    Prostate cancer Neg Hx     Medications- reviewed and updated Current  Outpatient Medications  Medication Sig Dispense Refill   allopurinol (ZYLOPRIM) 300 MG tablet Take 1 tablet (300 mg total) by mouth daily. 90 tablet 3   amLODipine (NORVASC) 5 MG tablet Take 1 tablet (5 mg total) by mouth daily. 90 tablet 3   Cholecalciferol (VITAMIN D3) 50 MCG (2000 UT) TABS Take by mouth daily.     hydrochlorothiazide (HYDRODIURIL) 25 MG tablet Take 1 tablet (25 mg total) by mouth daily. 90 tablet 3   ketoconazole 2%-triamcinolone 0.1% 1:2 cream mixture Apply topically daily as needed 45 g 5   Loratadine 10 MG CAPS Take 10 mg by mouth at bedtime.      losartan (COZAAR) 100 MG tablet Take 1 tablet (100 mg total) by mouth daily. 90 tablet 0   metFORMIN (GLUCOPHAGE) 500 MG tablet Take 1 tablet (500 mg total) by mouth 2 (two) times daily with a meal. 180 tablet 0   tadalafil (CIALIS) 10 MG tablet Take 1 tablet (10 mg  total) by mouth every other day as needed for erectile dysfunction. 90 tablet 3   No current facility-administered medications for this visit.    Allergies-reviewed and updated No Known Allergies  Social History   Socioeconomic History   Marital status: Married    Spouse name: Not on file   Number of children: Not on file   Years of education: Not on file   Highest education level: Not on file  Occupational History   Not on file  Tobacco Use   Smoking status: Never   Smokeless tobacco: Never  Vaping Use   Vaping status: Never Used  Substance and Sexual Activity   Alcohol use: Yes    Alcohol/week: 7.0 standard drinks of alcohol    Types: 7 Glasses of wine per week    Comment: glass of wine per night   Drug use: No   Sexual activity: Not on file  Other Topics Concern   Not on file  Social History Narrative   Not on file   Social Determinants of Health   Financial Resource Strain: Not on file  Food Insecurity: Not on file  Transportation Needs: Not on file  Physical Activity: Not on file  Stress: Not on file  Social Connections: Not on file         Objective:  Physical Exam: BP (!) 167/83   Pulse (!) 53   Temp 97.8 F (36.6 C) (Temporal)   Ht 5\' 8"  (1.727 m)   Wt 296 lb 12.8 oz (134.6 kg)   SpO2 97%   BMI 45.13 kg/m   Body mass index is 45.13 kg/m. Wt Readings from Last 3 Encounters:  11/09/22 296 lb 12.8 oz (134.6 kg)  09/27/22 300 lb (136.1 kg)  08/02/22 297 lb (134.7 kg)   Gen: NAD, resting comfortably HEENT: TMs normal bilaterally. OP clear. No thyromegaly noted.  CV: RRR with no murmurs appreciated Pulm: NWOB, CTAB with no crackles, wheezes, or rhonchi GI: Normal bowel sounds present. Soft, Nontender, Nondistended. MSK: no edema, cyanosis, or clubbing noted Skin: warm, dry Neuro: CN2-12 grossly intact. Strength 5/5 in upper and lower extremities. Reflexes symmetric and intact bilaterally.  Psych: Normal affect and thought content  Time Spent: 40 minutes of total time was spent on the date of the encounter performing the following actions: chart review prior to seeing the patient including recent visits with specialists, obtaining history, performing a medically necessary exam, counseling on the treatment plan, placing orders, and documenting in our EHR.       Katina Degree. Jimmey Ralph, MD 11/09/2022 9:10 AM

## 2022-11-09 NOTE — Assessment & Plan Note (Signed)
Check uric acid. Continue allopurinol 300 mg daily.

## 2022-11-09 NOTE — Assessment & Plan Note (Signed)
Check Vitamin D.  

## 2022-11-09 NOTE — Assessment & Plan Note (Signed)
We will refer to dermatology. 

## 2022-11-09 NOTE — Patient Instructions (Addendum)
It was very nice to see you today!  We will check blood work   Please start the amlodipine.   Keep working on diet and exercise.  We will refer you for your colonoscopy.  We will refer you to see the dermatologist.   Return in about 1 year (around 11/09/2023) for Annual Physical.   Take care, Dr Jimmey Ralph  PLEASE NOTE:  If you had any lab tests, please let us know if you have not heard back within a few days. You may see your results on mychart before we have a chance to review them but we will give you a call once they are reviewed by [place].   If we ordered any referrals today, please let us know if you have not heard from their office within the next week.   If you had any urgent prescriptions sent in today, please check with the pharmacy within an hour of our visit to make sure the prescription was transmitted appropriately.   Please try these tips to maintain a healthy lifestyle:  Eat at least 3 REAL meals and 1-2 snacks per day.  Aim for no more than 5 hours between eating.  If you eat breakfast, please do so within one hour of getting up.   Each meal should contain half fruits/vegetables, one quarter protein, and one quarter carbs (no bigger than a computer mouse)  Cut down on sweet beverages. This includes juice, soda, and sweet tea.   Drink at least 1 glass of water with each meal and aim for at least 8 glasses per day  Exercise at least 150 minutes every week.    Preventive Care 73 Years and Older, Male Preventive care refers to lifestyle choices and visits with your health care provider that can promote health and wellness. Preventive care visits are also called wellness exams. What can I expect for my preventive care visit? Counseling During your preventive care visit, your health care provider may ask about your: Medical history, including: Past medical problems. Family medical history. History of falls. Current health, including: Emotional well-being. Home life  and relationship well-being. Sexual activity. Memory and ability to understand (cognition). Lifestyle, including: Alcohol, nicotine or tobacco, and drug use. Access to firearms. Diet, exercise, and sleep habits. Work and work Astronomer. Sunscreen use. Safety issues such as seatbelt and bike helmet use. Physical exam Your health care provider will check your: Height and weight. These may be used to calculate your BMI (body mass index). BMI is a measurement that tells if you are at a healthy weight. Waist circumference. This measures the distance around your waistline. This measurement also tells if you are at a healthy weight and may help predict your risk of certain diseases, such as type 2 diabetes and high blood pressure. Heart rate and blood pressure. Body temperature. Skin for abnormal spots. What immunizations do I need?  Vaccines are usually given at various ages, according to a schedule. Your health care provider will recommend vaccines for you based on your age, medical history, and lifestyle or other factors, such as travel or where you work. What tests do I need? Screening Your health care provider may recommend screening tests for certain conditions. This may include: Lipid and cholesterol levels. Diabetes screening. This is done by checking your blood sugar (glucose) after you have not eaten for a while (fasting). Hepatitis C test. Hepatitis B test. HIV (human immunodeficiency virus) test. STI (sexually transmitted infection) testing, if you are at risk. Lung cancer screening. Colorectal  cancer screening. Prostate cancer screening. Abdominal aortic aneurysm (AAA) screening. You may need this if you are a current or former smoker. Talk with your health care provider about your test results, treatment options, and if necessary, the need for more tests. Follow these instructions at home: Eating and drinking  Eat a diet that includes fresh fruits and vegetables, whole  grains, lean protein, and low-fat dairy products. Limit your intake of foods with high amounts of sugar, saturated fats, and salt. Take vitamin and mineral supplements as recommended by your health care provider. Do not drink alcohol if your health care provider tells you not to drink. If you drink alcohol: Limit how much you have to 0-2 drinks a day. Know how much alcohol is in your drink. In the [place] one drink equals one 12 oz bottle of beer (355 mL), one 5 oz glass of wine (148 mL), or one 1 oz glass of hard liquor (44 mL). Lifestyle Brush your teeth every morning and night with fluoride toothpaste. Floss one time each day. Exercise for at least 30 minutes 5 or more days each week. Do not use any products that contain nicotine or tobacco. These products include cigarettes, chewing tobacco, and vaping devices, such as e-cigarettes. If you need help quitting, ask your health care provider. Do not use drugs. If you are sexually active, practice safe sex. Use a condom or other form of protection to prevent STIs. Take aspirin only as told by your health care provider. Make sure that you understand how much to take and what form to take. Work with your health care provider to find out whether it is safe and beneficial for you to take aspirin daily. Ask your health care provider if you need to take a cholesterol-lowering medicine (statin). Find healthy ways to manage stress, such as: Meditation, yoga, or listening to music. Journaling. Talking to a trusted person. Spending time with friends and family. Safety Always wear your seat belt while driving or riding in a vehicle. Do not drive: If you have been drinking alcohol. Do not ride with someone who has been drinking. When you are tired or distracted. While texting. If you have been using any mind-altering substances or drugs. Wear a helmet and other protective equipment during sports activities. If you have firearms in your house, make sure  you follow all gun safety procedures. Minimize exposure to UV radiation to reduce your risk of skin cancer. What's next? Visit your health care provider once a year for an annual wellness visit. Ask your health care provider how often you should have your eyes and teeth checked. Stay up to date on all vaccines. This information is not intended to replace advice given to you by your health care provider. Make sure you discuss any questions you have with your health care provider. Document Revised: 06/30/2020 Document Reviewed: 06/30/2020 Elsevier Patient Education  2024 [employer].

## 2022-11-09 NOTE — Assessment & Plan Note (Addendum)
Following with medical weight management. On metformin 500 mg twice daily. Check A1c.

## 2022-11-09 NOTE — Assessment & Plan Note (Deleted)
Check uric acid. Continue allopurinol 300 mg daily.

## 2022-11-09 NOTE — Assessment & Plan Note (Signed)
Check lipids. He is working with [facility] for lifestyle modifications.

## 2022-11-13 NOTE — Progress Notes (Signed)
Great news!  Labs are all at goal.  We can recheck in a year or so.

## 2022-11-22 ENCOUNTER — Other Ambulatory Visit (HOSPITAL_COMMUNITY): Payer: Self-pay

## 2022-11-22 ENCOUNTER — Encounter (INDEPENDENT_AMBULATORY_CARE_PROVIDER_SITE_OTHER): Payer: Self-pay | Admitting: Family Medicine

## 2022-11-22 ENCOUNTER — Ambulatory Visit (INDEPENDENT_AMBULATORY_CARE_PROVIDER_SITE_OTHER): Payer: Medicare Other | Admitting: Family Medicine

## 2022-11-22 VITALS — BP 165/84 | HR 70 | Temp 98.2°F | Ht 68.0 in | Wt 296.0 lb

## 2022-11-22 DIAGNOSIS — I10 Essential (primary) hypertension: Secondary | ICD-10-CM | POA: Diagnosis not present

## 2022-11-22 DIAGNOSIS — E669 Obesity, unspecified: Secondary | ICD-10-CM | POA: Diagnosis not present

## 2022-11-22 DIAGNOSIS — Z6841 Body Mass Index (BMI) 40.0 and over, adult: Secondary | ICD-10-CM

## 2022-11-22 DIAGNOSIS — R7303 Prediabetes: Secondary | ICD-10-CM | POA: Diagnosis not present

## 2022-11-22 DIAGNOSIS — I1A0 Resistant hypertension: Secondary | ICD-10-CM | POA: Diagnosis not present

## 2022-11-22 MED ORDER — METFORMIN HCL ER 500 MG PO TB24
1000.0000 mg | ORAL_TABLET | Freq: Every day | ORAL | 0 refills | Status: DC
  Filled 2022-11-22: qty 90, 90d supply, fill #0
  Filled 2022-11-22: qty 180, 90d supply, fill #0

## 2022-11-22 NOTE — Progress Notes (Signed)
.sm[facility]  Office: [phone]  /  Fax: [phone]  WEIGHT SUMMARY AND BIOMETRICS  Anthropometric Measurements Height: 5\' 8"  (1.727 m) Weight: 296 lb (134.3 kg) BMI (Calculated): 45.02 Weight at Last Visit: 300 lb Weight Lost Since Last Visit: 4 lb Weight Gained Since Last Visit: 0 Starting Weight: 317 lb Total Weight Loss (lbs): 21 lb (9.526 kg)   Body Composition  Body Fat %: 42 % Fat Mass (lbs): 124.6 lbs Muscle Mass (lbs): 163.6 lbs Total Body Water (lbs): 132.4 lbs Visceral Fat Rating : 30   Other Clinical Data Fasting: No Labs: No Today's Visit #: 24 Starting Date: 02/02/21    Chief Complaint: OBESITY   History of Present Illness   The patient, with a history of prediabetes, hypertension, and obesity, presents for a routine follow-up. He has been on metformin for prediabetes and recently started amlodipine for hypertension. He reports a weight loss of four pounds over the last two months and has been adhering to a category three diet plan approximately 30% of the time. His physical activity includes leisurely walks and yard work.  The patient reports no issues with metformin, but notes that it softens the stool and sometimes causes urgency. He also mentions occasional knee discomfort, managed with Voltaren gel and intermittent Aleve, particularly when engaging in physical activities.  The patient's spouse has recently gained weight, which has affected their meal times and dietary habits. The patient has been trying to adjust his eating schedule to match his spouse's earlier meal times due to her GERD. He has been sharing meals and desserts to control portion sizes.  He has been adhering to his medication regimen, which includes metformin for prediabetes and amlodipine for hypertension.          PHYSICAL EXAM:  Blood pressure (!) 165/84, pulse 70, temperature 98.2 F (36.8 C), height 5\' 8"  (1.727 m), weight 296 lb (134.3 kg), SpO2 98%. Body mass index is 45.01  kg/m.  DIAGNOSTIC DATA REVIEWED:  BMET    Component Value Date/Time   NA 140 11/09/2022 0914   NA 142 07/25/2021 0742   K 4.4 11/09/2022 0914   CL 108 11/09/2022 0914   CO2 27 11/09/2022 0914   GLUCOSE 105 (H) 11/09/2022 0914   BUN 18 11/09/2022 0914   BUN 20 07/25/2021 0742   CREATININE 0.92 11/09/2022 0914   CREATININE 0.99 06/05/2018 1352   CALCIUM 9.6 11/09/2022 0914   GFRNONAA 43 (L) 02/16/2016 1839   GFRAA 49 (L) 02/16/2016 1839   Lab Results  Component Value Date   HGBA1C 5.4 11/09/2022   HGBA1C 5.6 01/23/2017   Lab Results  Component Value Date   INSULIN 19.4 07/25/2021   INSULIN 20.6 02/02/2021   Lab Results  Component Value Date   TSH 2.26 11/09/2022   CBC    Component Value Date/Time   WBC 6.8 11/09/2022 0914   RBC 5.18 11/09/2022 0914   HGB 15.1 11/09/2022 0914   HGB 15.7 07/25/2021 0742   HCT 46.9 11/09/2022 0914   HCT 46.8 07/25/2021 0742   PLT 143.0 (L) 11/09/2022 0914   PLT 156 07/25/2021 0742   MCV 90.5 11/09/2022 0914   MCV 89 07/25/2021 0742   MCH 29.9 07/25/2021 0742   MCH 29.4 03/31/2020 1510   MCHC 32.1 11/09/2022 0914   RDW 14.4 11/09/2022 0914   RDW 13.1 07/25/2021 0742   Iron Studies    Component Value Date/Time   IRON 108 03/31/2020 1510   TIBC 307 03/31/2020 1510  FERRITIN 451 (H) 03/31/2020 1510   IRONPCTSAT 35 03/31/2020 1510   Lipid Panel     Component Value Date/Time   CHOL 166 11/09/2022 0914   CHOL 178 07/25/2021 0742   TRIG 58.0 11/09/2022 0914   HDL 52.90 11/09/2022 0914   HDL 56 07/25/2021 0742   CHOLHDL 3 11/09/2022 0914   VLDL 11.6 11/09/2022 0914   LDLCALC 101 (H) 11/09/2022 0914   LDLCALC 105 (H) 07/25/2021 0742   LDLCALC 104 (H) 06/05/2018 1352   Hepatic Function Panel     Component Value Date/Time   PROT 6.6 11/09/2022 0914   PROT 7.2 07/25/2021 0742   ALBUMIN 4.2 11/09/2022 0914   ALBUMIN 4.6 07/25/2021 0742   AST 19 11/09/2022 0914   ALT 18 11/09/2022 0914   ALKPHOS 61 11/09/2022 0914    BILITOT 0.8 11/09/2022 0914   BILITOT 0.6 07/25/2021 0742   BILIDIR 0.2 01/03/2007 0000      Component Value Date/Time   TSH 2.26 11/09/2022 0914   Nutritional Lab Results  Component Value Date   VD25OH 39.95 11/09/2022   VD25OH 59.5 07/25/2021   VD25OH 61.59 01/03/2021     Assessment and Plan    Prediabetes Controlled on Metformin. Reports some GI side effects. -Change Metformin to extended release formulation. -Continue diet and exercise. -continue category 3 eating plan and holiday eating strategies discussed  Hypertension Elevated blood pressure today (165/84). Recently started on Amlodipine 5mg  daily. -Continue Amlodipine 5mg  daily. -Upcoming appointment with hypertension clinic. -Continue with weight loss efforts to control BP  Obesity Lost 4 pounds in the last 2 months. Adheres to diet plan approximately 30% of the time. Engages in leisurely walks and yard work. -Encourage continued diet and exercise. -Plan follow-up in 8-10 weeks to reassess weight and discuss holiday eating strategies.        He was informed of the importance of frequent follow up visits to maximize his success with intensive lifestyle modifications for his multiple health conditions.    Quillian Quince, MD

## 2022-11-30 ENCOUNTER — Other Ambulatory Visit (HOSPITAL_COMMUNITY): Payer: Self-pay

## 2022-11-30 ENCOUNTER — Encounter (HOSPITAL_BASED_OUTPATIENT_CLINIC_OR_DEPARTMENT_OTHER): Payer: Self-pay | Admitting: Family

## 2022-11-30 ENCOUNTER — Ambulatory Visit (INDEPENDENT_AMBULATORY_CARE_PROVIDER_SITE_OTHER): Payer: Medicare Other | Admitting: Family

## 2022-11-30 VITALS — BP 150/72 | HR 70 | Ht 68.0 in | Wt 300.9 lb

## 2022-11-30 DIAGNOSIS — I25118 Atherosclerotic heart disease of native coronary artery with other forms of angina pectoris: Secondary | ICD-10-CM | POA: Diagnosis not present

## 2022-11-30 DIAGNOSIS — I10 Essential (primary) hypertension: Secondary | ICD-10-CM | POA: Diagnosis not present

## 2022-11-30 DIAGNOSIS — G4733 Obstructive sleep apnea (adult) (pediatric): Secondary | ICD-10-CM | POA: Diagnosis not present

## 2022-11-30 DIAGNOSIS — E785 Hyperlipidemia, unspecified: Secondary | ICD-10-CM

## 2022-11-30 MED ORDER — VALSARTAN 160 MG PO TABS
160.0000 mg | ORAL_TABLET | Freq: Every day | ORAL | 1 refills | Status: DC
  Filled 2022-11-30: qty 90, 90d supply, fill #0

## 2022-11-30 NOTE — Progress Notes (Signed)
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   Prior coronary calcium score 04/2020 of 15 placing him in the 33rd percentil. Mild dilation of 41mm.   Presents today after referral from Dr. Dalbert Garnet at [facility]. At visit with PCP 11/09/22 Losartan 100mg  daily continued and Amlodipine 5mg  daily initiated.   Keith Champagne, MD was diagnosed with hypertension years ago, but it has become worse in the last two years. It has been difficult to control. Blood pressure not checked routinely at home. Readings have been 170s at [facility]. he reports tobacco use never. Alcohol use occasionally. For exercise he walks with his wife and does yard work. he eats out often since getting married in June and does not follow low sodium diet.   Takes losartan in the morning and amlodipine at night, admits to taking hydrochlorothiazide very infrequently. Was previously prescribed hydrochlorothiazide for kidney stones per his report.  Using oral device to treat OSA, feels that it is working well for him. Notices bilateral LE edema that is worse at the end of the day and better in the morning. Reports no shortness of breath nor dyspnea on exertion. Reports no chest pain, pressure, or tightness. No orthopnea, PND. Reports no palpitations.   Previous antihypertensives:  Past Medical History:  Diagnosis Date   Acute meniscal tear of left knee    Allergy    fire ants   Back pain    Complication of anesthesia    Edema of both lower  extremities    Fatigue    History of kidney stones    Hyperlipidemia    Hypertension    Joint pain    Osteoarthritis    PONV (postoperative nausea and vomiting)    Sleep apnea    SOB (shortness of breath) on exertion    Vitamin D deficiency     Past Surgical History:  Procedure Laterality Date   BACK SURGERY     laminectomy L5-S1   COLONOSCOPY WITH PROPOFOL N/A 03/14/2017   Procedure: COLONOSCOPY WITH PROPOFOL;  Surgeon: Vida Rigger, MD;  Location: [facility];  Service: Endoscopy;  Laterality: N/A;   Colonscopy     polyps   CYSTOSCOPY KIDNEY W/ URETERAL GUIDE WIRE  2010   lithotripsy   CYSTOSCOPY/RETROGRADE/URETEROSCOPY/STONE EXTRACTION WITH BASKET Right 02/16/2016   Procedure: CYSTOSCOPY/RETROGRADE/RIGHT FLEXIBLE URETEROSCOPY/STONE EXTRACTION WITH BASKET;  Surgeon: Heloise Purpura, MD;  Location: [facility];  Service: Urology;  Laterality: Right;   HOLMIUM LASER APPLICATION Left 02/16/2016   Procedure: HOLMIUM LASER APPLICATION;  Surgeon: Heloise Purpura, MD;  Location: [facility];  Service: Urology;  Laterality: Left;   KNEE ARTHROSCOPY Right 2013   KNEE ARTHROSCOPY WITH MEDIAL MENISECTOMY Left 03/12/2015   Procedure: LEFT KNEE ARTHROSCOPY WITH PARTIAL MEDIAL MENISCECTOMY;  Surgeon: Kathryne Hitch, MD;  Location: [facility];  Service: Orthopedics;  Laterality: Left;   LAMINECTOMY  1999   MR HIPS BILATERAL     2009&2010   TOTAL HIP REVISION  09/15/2011   Procedure: TOTAL HIP REVISION;  Surgeon: Kathryne Hitch, MD;  Location: [facility] OR;  Service: Orthopedics;  Laterality: Right;  Revision right hip acetabular component    Current Medications: Current Meds  Medication Sig   allopurinol (ZYLOPRIM) 300 MG tablet Take 1 tablet (300 mg total) by mouth daily.   amLODipine (NORVASC) 5 MG tablet Take 1 tablet (5 mg total) by mouth daily.   Cholecalciferol (VITAMIN D3) 50 MCG (2000 UT) TABS Take by mouth daily.   ketoconazole 2%-triamcinolone 0.1% 1:2 cream mixture Apply topically daily as  needed   Loratadine 10 MG CAPS Take 10 mg by mouth at bedtime.    metFORMIN (GLUCOPHAGE-XR) 500 MG 24 hr tablet Take 2 tablets (1,000 mg total) by mouth daily with breakfast.   tadalafil (CIALIS) 10 MG tablet Take 1 tablet (10 mg total) by mouth every other day as needed for erectile dysfunction.   valsartan (DIOVAN) 160 MG tablet Take 1 tablet (160 mg total) by mouth daily.   [DISCONTINUED] losartan (COZAAR) 100 MG tablet Take 1 tablet (100 mg total) by mouth daily.     Allergies:   Patient has no known allergies.   Social History   Socioeconomic History   Marital status: Married    Spouse name: Not on file   Number of children: Not on file   Years of education: Not on file   Highest education level: Not on file  Occupational History   Not on file  Tobacco Use   Smoking status: Never   Smokeless tobacco: Never  Vaping Use   Vaping status: Never Used  Substance and Sexual Activity   Alcohol use: Yes    Alcohol/week: 7.0 standard drinks of alcohol    Types: 7 Glasses of wine per week    Comment: glass of wine per night   Drug use: No   Sexual activity: Not on file  Other Topics Concern   Not on file  Social History Narrative   Not on file   Social Determinants of Health   Financial Resource Strain: Not on file  Food Insecurity: Not on file  Transportation Needs: Not on file  Physical Activity: Not on file  Stress: Not on file  Social Connections: Not on file     Family History: The patient's family history includes Anemia in his father; Cancer in his father; Colon cancer in his father; Diabetes in his father and mother; Heart attack in his sister; Heart disease in his mother; Heart failure in his brother; Hypertension in his father and mother; Kidney failure in his mother; Obesity in his brother, brother, brother, brother, father, mother, and sister; Prostatitis in his brother; Sleep apnea in his brother; Stroke in his mother. There is no history of Prostate  cancer.  ROS:   Please see the history of present illness.    All other systems reviewed and are negative.  EKGs/Labs/Other Studies Reviewed:    EKG Interpretation Date/Time:  Thursday November 30 2022 10:27:21 EST Ventricular Rate:  59 PR Interval:  188 QRS Duration:  102 QT Interval:  406 QTC Calculation: 401 R Axis:   79  Text Interpretation: Sinus bradycardia No acute changes Confirmed by Gillian Shields ([id number]) on 11/30/2022 10:33:19 AM    Recent Labs: 11/09/2022: ALT 18; BUN 18; Creatinine, Ser 0.92; Hemoglobin 15.1; Platelets 143.0; Potassium 4.4; Sodium 140; TSH 2.26   Recent Lipid Panel    Component Value Date/Time   CHOL 166 11/09/2022 0914   CHOL 178 07/25/2021 0742   TRIG 58.0 11/09/2022 0914  HDL 52.90 11/09/2022 0914   HDL 56 07/25/2021 0742   CHOLHDL 3 11/09/2022 0914   VLDL 11.6 11/09/2022 0914   LDLCALC 101 (H) 11/09/2022 0914   LDLCALC 105 (H) 07/25/2021 0742   LDLCALC 104 (H) 06/05/2018 1352    Physical Exam:   VS:  BP (!) 150/72 (BP Location: Right Arm)   Pulse 70   Ht 5\' 8"  (1.727 m)   Wt (!) 300 lb 14.4 oz (136.5 kg)   SpO2 97%   BMI 45.75 kg/m  , BMI Body mass index is 45.75 kg/m. GENERAL:  Well appearing HEENT: Pupils equal round and reactive, fundi not visualized, oral mucosa unremarkable NECK:  No jugular venous distention, waveform within normal limits, carotid upstroke brisk and symmetric, no bruits, no thyromegaly LYMPHATICS:  No cervical adenopathy LUNGS:  Clear to auscultation bilaterally HEART:  RRR.  PMI not displaced or sustained,S1 and S2 within normal limits, no S3, no S4, no clicks, no rubs, no murmurs ABD:  Flat, positive bowel sounds normal in frequency in pitch, no bruits, no rebound, no guarding, no midline pulsatile mass, no hepatomegaly, no splenomegaly EXT:  2 plus pulses throughout, no edema, no cyanosis no clubbing SKIN:  No rashes no nodules NEURO:  Cranial nerves II through XII grossly intact, motor grossly intact  throughout PSYCH:  Cognitively intact, oriented to person place and time   ASSESSMENT/PLAN:    HTN - BP not at goal <130/80. Stop Losartan, start valsartan 160 mg. Can further increase Valsartan in future and consider Amlodipine-Valsartan in the future. Due to intermittent LE edema, will defer further increasing Amlodipine. Could also consider more routine use of hydrochlorothiazide in the future, presently taking PRN.  Labs in one week: BMP, catecholamines, metanephrines, and renin/aldosterone in one week.  Renal artery duplex to rule out renal stenosis.  Discussed to monitor BP at home at least 2 hours after medications and sitting for 5-10 minutes.  Nonobsturcive CAD / HLD, LDL goal <70 - 04/2020 calcium score of 15 with mild dilation ascending aorta 41mm. 11/09/22 LDL 101. Goal LDL < 70. Patient hesitant to start statin, but agrees to rosuvastatin 10 mg three times per week. FLP/LFT prior to next clinic visit. Recommend aiming for 150 minutes of moderate intensity activity per week and following a heart healthy diet.    OSA - Treated with dental device.   Screening for Secondary Hypertension:     11/30/2022    4:00 PM  Causes  Renovascular HTN Screened  Sleep Apnea Screened     - Comments treated  Thyroid Disease Screened  Hyperaldosteronism Screened  Pheochromocytoma Screened  Cushing's Syndrome N/A  Coarctation of the Aorta N/A  Compliance Screened    Relevant Labs/Studies:    Latest Ref Rng & Units 11/09/2022    9:14 AM 07/25/2021    7:42 AM 02/02/2021   11:23 AM  Basic Labs  Sodium 135 - 145 mEq/L 140  142  141   Potassium 3.5 - 5.1 mEq/L 4.4  4.1  4.6   Creatinine 0.40 - 1.50 mg/dL 7.82  9.56  2.13        Latest Ref Rng & Units 11/09/2022    9:14 AM 01/03/2021   10:22 AM  Thyroid   TSH 0.35 - 5.50 uIU/mL 2.26  2.63                 11/30/2022   11:18 AM  Renovascular   Renal Artery [place] Completed Yes      Disposition:  FU with MD/PharmD/APP in 2 months     Medication Adjustments/Labs and Tests Ordered: Current medicines are reviewed at length with the patient today.  Concerns regarding medicines are outlined above.  Orders Placed This Encounter  Procedures   Lipid panel   Hepatic function panel   Basic metabolic panel   Catecholamines, fractionated, plasma   Metanephrines, plasma   EKG 12-Lead   VAS US RENAL ARTERY DUPLEX   Meds ordered this encounter  Medications   valsartan (DIOVAN) 160 MG tablet    Sig: Take 1 tablet (160 mg total) by mouth daily.    Dispense:  90 tablet    Refill:  1     Signed, Alver Sorrow, NP  11/30/2022 6:50 PM    [facility]

## 2022-11-30 NOTE — Patient Instructions (Signed)
Medication Instructions:  Your physician has recommended you make the following change in your medication:   STOP Losartan START Valsartan 160mg  daily START rosuvastatin 10mg  three times a week  *If you need a refill on your cardiac medications before your next appointment, please call your pharmacy*   Lab Work: Labs to be done in 1 week: BMP, Cata, meta, renal aldosterone  Labs to be done 1 week before follow up: Fasting Lipid and Liver Function  If you have labs (blood work) drawn today and your tests are completely normal, you will receive your results only by: MyChart Message (if you have MyChart) OR A paper copy in the mail If you have any lab test that is abnormal or we need to change your treatment, we will call you to review the results.   Testing/Procedures: Your physician has requested that you have a renal artery duplex. During this test, an ultrasound is used to evaluate blood flow to the kidneys. Allow one hour for this exam. Do not eat after midnight the day before and avoid carbonated beverages. Take your medications as you usually do.  Follow-Up: At [facility], you and your health needs are our priority.  As part of our continuing mission to provide you with exceptional heart care, we have created designated Provider Care Teams.  These Care Teams include your primary Cardiologist (physician) and Advanced Practice Providers (APPs -  Physician Assistants and Nurse Practitioners) who all work together to provide you with the care you need, when you need it.  We recommend signing up for the patient portal called "MyChart".  Sign up information is provided on this After Visit Summary.  MyChart is used to connect with patients for Virtual Visits (Telemedicine).  Patients are able to view lab/test results, encounter notes, upcoming appointments, etc.  Non-urgent messages can be sent to your provider as well.   To learn more about what you can do with MyChart, go to  [email or web address].    Your next appointment:   2 month(s) ([facility] HTN CLINIC)  Provider:   Gillian Shields, NP

## 2022-12-01 ENCOUNTER — Ambulatory Visit (INDEPENDENT_AMBULATORY_CARE_PROVIDER_SITE_OTHER): Payer: Medicare Other

## 2022-12-01 VITALS — Ht 68.0 in | Wt 300.0 lb

## 2022-12-01 DIAGNOSIS — Z0000 Encounter for general adult medical examination without abnormal findings: Secondary | ICD-10-CM

## 2022-12-01 DIAGNOSIS — Z0100 Encounter for examination of eyes and vision without abnormal findings: Secondary | ICD-10-CM

## 2022-12-01 NOTE — Progress Notes (Signed)
 Because this visit was a virtual/telehealth visit,  certain criteria was not obtained, such a blood pressure, CBG if applicable, and timed get up and go. Any medications not marked as "taking" were not mentioned during the medication reconciliation part of the visit. Any vitals not documented were not able to be obtained due to this being a telehealth visit or patient was unable to self-report a recent blood pressure reading due to a lack of equipment at home via telehealth. Vitals that have been documented are verbally provided by the patient.   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  Visit Complete: Virtual I connected with  Keith Champagne, MD on 12/01/22 by a audio enabled telemedicine application and verified that I am speaking with the correct person using two identifiers.  Patient Location: Home  Provider Location: Home Office  I discussed the limitations of evaluation and management by telemedicine. The patient expressed understanding and agreed to proceed.  Vital Signs: Because this visit was a virtual/telehealth visit, some criteria may be missing or patient reported. Any vitals not documented were not able to be obtained and vitals that have been documented are patient reported.  Patient Medicare AWV questionnaire was completed by the patient on na; I have confirmed that all information answered by patient is correct and no changes since this date.  Cardiac Risk Factors include: advanced age (>42men, >26 women);dyslipidemia;hypertension;male gender;obesity (BMI >30kg/m2);sedentary lifestyle     Objective:    Today's Vitals   12/01/22 1026  Weight: 300 lb (136.1 kg)  Height: 5\' 8"  (1.727 m)   Body mass index is 45.61 kg/m.     12/01/2022   10:31 AM 03/14/2017   12:12 PM 03/02/2017   12:35 PM 02/16/2016    6:56 PM 03/12/2015    3:32 PM 09/14/2011    3:19 PM  Advanced Directives  Does Patient Have a  Medical Advance Directive? No Yes Yes Yes Yes Patient does not have advance directive;Patient has advance directive, copy not in chart  Type of Advance Directive  Healthcare Power of [place];Living will Living will;Healthcare Power of [employer] Power of [place];Living will Living will Healthcare Power of [place];Living will  Does patient want to make changes to medical advance directive?   No - Patient declined No - Patient declined No - Patient declined   Copy of Healthcare Power of Attorney in Chart?  No - copy requested No - copy requested No - copy requested No - copy requested Copy requested from family  Would patient like information on creating a medical advance directive? No - Patient declined       Pre-existing out of facility DNR order (yellow form or pink MOST form)      No    Current Medications (verified) Outpatient Encounter Medications as of 12/01/2022  Medication Sig   allopurinol (ZYLOPRIM) 300 MG tablet Take 1 tablet (300 mg total) by mouth daily.   amLODipine (NORVASC) 5 MG tablet Take 1 tablet (5 mg total) by mouth daily.   Cholecalciferol (VITAMIN D3) 50 MCG (2000 UT) TABS Take by mouth daily.   hydrochlorothiazide (HYDRODIURIL) 25 MG tablet Take 1 tablet (25 mg total) by mouth daily.   ketoconazole 2%-triamcinolone 0.1% 1:2 cream mixture Apply topically daily as needed   Loratadine 10 MG CAPS Take 10 mg by mouth at bedtime.    metFORMIN (GLUCOPHAGE-XR) 500 MG 24 hr tablet Take 2 tablets (1,000 mg total) by  mouth daily with breakfast.   tadalafil (CIALIS) 10 MG tablet Take 1 tablet (10 mg total) by mouth every other day as needed for erectile dysfunction.   valsartan (DIOVAN) 160 MG tablet Take 1 tablet (160 mg total) by mouth daily.   No facility-administered encounter medications on file as of 12/01/2022.    Allergies (verified) Patient has no known allergies.   History: Past Medical History:  Diagnosis Date   Acute meniscal tear of left knee     Allergy    fire ants   Back pain    Complication of anesthesia    Coronary artery disease    Edema of both lower extremities    Fatigue    History of kidney stones    Hyperlipidemia    Hypertension    Joint pain    Osteoarthritis    PONV (postoperative nausea and vomiting)    Sleep apnea    SOB (shortness of breath) on exertion    Vitamin D deficiency    Past Surgical History:  Procedure Laterality Date   BACK SURGERY     laminectomy L5-S1   COLONOSCOPY WITH PROPOFOL N/A 03/14/2017   Procedure: COLONOSCOPY WITH PROPOFOL;  Surgeon: Vida Rigger, MD;  Location: [facility];  Service: Endoscopy;  Laterality: N/A;   Colonscopy     polyps   CYSTOSCOPY KIDNEY W/ URETERAL GUIDE WIRE  2010   lithotripsy   CYSTOSCOPY/RETROGRADE/URETEROSCOPY/STONE EXTRACTION WITH BASKET Right 02/16/2016   Procedure: CYSTOSCOPY/RETROGRADE/RIGHT FLEXIBLE URETEROSCOPY/STONE EXTRACTION WITH BASKET;  Surgeon: Heloise Purpura, MD;  Location: [facility];  Service: Urology;  Laterality: Right;   HOLMIUM LASER APPLICATION Left 02/16/2016   Procedure: HOLMIUM LASER APPLICATION;  Surgeon: Heloise Purpura, MD;  Location: [facility];  Service: Urology;  Laterality: Left;   KNEE ARTHROSCOPY Right 2013   KNEE ARTHROSCOPY WITH MEDIAL MENISECTOMY Left 03/12/2015   Procedure: LEFT KNEE ARTHROSCOPY WITH PARTIAL MEDIAL MENISCECTOMY;  Surgeon: Kathryne Hitch, MD;  Location: [facility];  Service: Orthopedics;  Laterality: Left;   LAMINECTOMY  1999   MR HIPS BILATERAL     2009&2010   TOTAL HIP REVISION  09/15/2011   Procedure: TOTAL HIP REVISION;  Surgeon: Kathryne Hitch, MD;  Location: [facility];  Service: Orthopedics;  Laterality: Right;  Revision right hip acetabular component   Family History  Problem Relation Age of Onset   Obesity Mother    Hypertension Mother    Diabetes Mother    Stroke Mother    Kidney failure Mother    Heart disease Mother    Obesity Father    Hypertension Father    Colon cancer Father    Cancer  Father    Anemia Father    Diabetes Father    Heart attack Sister    Obesity Sister    Prostatitis Brother    Heart failure Brother    Sleep apnea Brother    Obesity Brother    Obesity Brother    Obesity Brother    Obesity Brother    Prostate cancer Neg Hx    Social History   Socioeconomic History   Marital status: Married    Spouse name: Not on file   Number of children: Not on file   Years of education: Not on file   Highest education level: Not on file  Occupational History   Not on file  Tobacco Use   Smoking status: Never   Smokeless tobacco: Never  Vaping Use   Vaping status: Never Used  Substance and Sexual Activity  Alcohol use: Yes    Alcohol/week: 7.0 standard drinks of alcohol    Types: 7 Glasses of wine per week    Comment: glass of wine per night   Drug use: No   Sexual activity: Not on file  Other Topics Concern   Not on file  Social History Narrative   Not on file   Social Determinants of Health   Financial Resource Strain: Low Risk  (12/01/2022)   Overall Financial Resource Strain (CARDIA)    Difficulty of Paying Living Expenses: Not hard at all  Food Insecurity: No Food Insecurity (12/01/2022)   Hunger Vital Sign    Worried About Running Out of Food in the Last Year: Never true    Ran Out of Food in the Last Year: Never true  Transportation Needs: No Transportation Needs (12/01/2022)   PRAPARE - Administrator, Civil Service (Medical): No    Lack of Transportation (Non-Medical): No  Physical Activity: Sufficiently Active (12/01/2022)   Exercise Vital Sign    Days of Exercise per Week: 7 days    Minutes of Exercise per Session: 30 min  Stress: No Stress Concern Present (12/01/2022)   [employer] of Occupational Health - Occupational Stress Questionnaire    Feeling of Stress : Not at all  Social Connections: Socially Integrated (12/01/2022)   Social Connection and Isolation Panel [NHANES]    Frequency of Communication  with Friends and Family: More than three times a week    Frequency of Social Gatherings with Friends and Family: More than three times a week    Attends Religious Services: More than 4 times per year    Active Member of [employer] or Organizations: Yes    Attends Engineer, structural: More than 4 times per year    Marital Status: Married    Tobacco Counseling Counseling given: Yes   Clinical Intake:  Pre-visit preparation completed: No  Pain : No/denies pain     BMI - recorded: 45.61 Nutritional Status: BMI > 30  Obese Nutritional Risks: None Diabetes: No  How often do you need to have someone help you when you read instructions, pamphlets, or other written materials from your doctor or pharmacy?: 1 - Never  Interpreter Needed?: No  Information entered by ::  Shuntae Herzig, CMA   Activities of Daily Living    12/01/2022   10:29 AM  In your present state of health, do you have any difficulty performing the following activities:  Hearing? 0  Vision? 0  Comment referral placed today for patient to establish w/ a new provider. previous provider retired.  Difficulty concentrating or making decisions? 0  Walking or climbing stairs? 0  Dressing or bathing? 0  Doing errands, shopping? 0  Preparing Food and eating ? N  Using the Toilet? N  In the past six months, have you accidently leaked urine? N  Do you have problems with loss of bowel control? N  Managing your Medications? N  Managing your Finances? N  Housekeeping or managing your Housekeeping? N    Patient Care Team: Ardith Dark, MD as PCP - General (Family Medicine)  Indicate any recent Medical Services you may have received from other than [facility] providers in the past year (date may be approximate).     Assessment:   This is a routine wellness examination for Michell.  Hearing/Vision screen Hearing Screening - Comments:: Patient denies any hearing difficulties.   Vision Screening - Comments:: Referral  placed for patient to establish  with a new eye care provider   Goals Addressed             This Visit's Progress    Patient Stated       To remain active healthy and independent       Depression Screen    12/01/2022   10:34 AM 11/09/2022    8:19 AM 08/29/2021    8:43 AM 02/02/2021   10:04 AM 01/03/2021    9:34 AM 08/15/2017    3:38 PM  PHQ 2/9 Scores  PHQ - 2 Score 0 0 0 1 0 0  PHQ- 9 Score 0   9      Fall Risk    12/01/2022   10:32 AM 11/09/2022    8:20 AM 08/29/2021    8:44 AM 01/03/2021    9:34 AM 08/15/2017    3:38 PM  Fall Risk   Falls in the past year? 0 0 0 0 No  Number falls in past yr: 0 0 0 0   Injury with Fall? 0 0 0 0   Risk for fall due to : No Fall Risks No Fall Risks No Fall Risks    Follow up Falls prevention discussed;Education provided        MEDICARE RISK AT HOME: Medicare Risk at Home Any stairs in or around the home?: Yes If so, are there any without handrails?: No Home free of loose throw rugs in walkways, pet beds, electrical cords, etc?: Yes Adequate lighting in your home to reduce risk of falls?: Yes Life alert?: No Use of a cane, walker or w/c?: No Grab bars in the bathroom?: Yes Shower chair or bench in shower?: Yes Elevated toilet seat or a handicapped toilet?: Yes  TIMED UP AND GO:  Was the test performed? No    Cognitive Function:        12/01/2022   10:33 AM  6CIT Screen  What Year? 0 points  What month? 0 points  What time? 0 points  Count back from 20 0 points  Months in reverse 0 points  Repeat phrase 0 points  Total Score 0 points    Immunizations Immunization History  Administered Date(s) Administered   Fluad Trivalent(High Dose 65+) 11/09/2022   Influenza Whole 01/03/2007   Influenza-Unspecified 10/11/2015, 10/21/2020   PFIZER Comirnaty(Gray Top)Covid-19 Tri-Sucrose Vaccine 01/25/2019, 02/14/2019, 01/27/2020   PNEUMOCOCCAL CONJUGATE-20 01/03/2021   Pfizer Covid-19 Vaccine Bivalent Booster 58yrs & up  11/12/2020   Tdap 08/29/2021   Zoster Recombinant(Shingrix) 08/29/2021    TDAP status: Up to date  Flu Vaccine status: Up to date  Pneumococcal vaccine status: Up to date  Covid-19 vaccine status: Completed vaccines  Qualifies for Shingles Vaccine? No   Zostavax completed No   Shingrix Completed?: Yes  Screening Tests Health Maintenance  Topic Date Due   Medicare Annual Wellness (AWV)  Never done   Colonoscopy  03/14/2022   Zoster Vaccines- Shingrix (2 of 2) 02/09/2023 (Originally 10/24/2021)   DTaP/Tdap/Td (2 - Td or Tdap) 08/30/2031   Pneumonia Vaccine 34+ Years old  Completed   INFLUENZA VACCINE  Completed   COVID-19 Vaccine  Completed   Hepatitis C Screening  Completed   HPV VACCINES  Aged Out    Health Maintenance  Health Maintenance Due  Topic Date Due   Medicare Annual Wellness (AWV)  Never done   Colonoscopy  03/14/2022    Colorectal Cancer Screenings: patient states he will be having a colonoscopy in December   Lung Cancer Screening: (Low  Dose CT Chest recommended if Age 21-80 years, 20 pack-year currently smoking OR have quit w/in 15years.) does not qualify.   Lung Cancer Screening Referral: na  Additional Screening:  Hepatitis C Screening: does not qualify; Completed 06/05/2018  Vision Screening: Recommended annual ophthalmology exams for early detection of glaucoma and other disorders of the eye. Is the patient up to date with their annual eye exam?  No  Who is the provider or what is the name of the office in which the patient attends annual eye exams? Referral placed today If pt is not established with a provider, would they like to be referred to a provider to establish care? Yes .   Dental Screening: Recommended annual dental exams for proper oral hygiene  Diabetic Foot Exam: na  Community Resource Referral / Chronic Care Management: CRR required this visit?  No   CCM required this visit?  No    Plan:     I have personally reviewed and  noted the following in the patient's chart:   Medical and social history Use of alcohol, tobacco or illicit drugs  Current medications and supplements including opioid prescriptions. Patient is not currently taking opioid prescriptions. Functional ability and status Nutritional status Physical activity Advanced directives List of other physicians Hospitalizations, surgeries, and ER visits in previous 12 months Vitals Screenings to include cognitive, depression, and falls Referrals and appointments  In addition, I have reviewed and discussed with patient certain preventive protocols, quality metrics, and best practice recommendations. A written personalized care plan for preventive services as well as general preventive health recommendations were provided to patient.     Jordan Hawks Veena Sturgess, CMA   12/01/2022   After Visit Summary: (MyChart) Due to this being a telephonic visit, the after visit summary with patients personalized plan was offered to patient via MyChart   See routing comment

## 2022-12-01 NOTE — Patient Instructions (Addendum)
Keith Brown , Thank you for taking time to come for your Medicare Wellness Visit. I appreciate your ongoing commitment to your health goals. Please review the following plan we discussed and let me know if I can assist you in the future.   Referrals/Orders/Follow-Ups/Clinician Recommendations:  Next Medicare Annual Wellness Visit: December 03, 2023 at 9:20 am virtual visit  You've been referred to see Dr. Sinda Du for an eye exam. If you haven't heard from his office in about a week, please call to schedule your appointment.   Sinda Du, MD [address] [place] [address]  Phone: [phone]   This is a list of the screening recommended for you and due dates:  Health Maintenance  Topic Date Due   Colon Cancer Screening  03/14/2022   Zoster (Shingles) Vaccine (2 of 2) 02/09/2023*   Medicare Annual Wellness Visit  12/01/2023   DTaP/Tdap/Td vaccine (2 - Td or Tdap) 08/30/2031   Pneumonia Vaccine  Completed   Flu Shot  Completed   COVID-19 Vaccine  Completed   Hepatitis C Screening  Completed   HPV Vaccine  Aged Out  *Topic was postponed. The date shown is not the original due date.    Advanced directives: (Declined) Advance directive discussed with you today. Even though you declined this today, please call our office should you change your mind, and we can give you the proper paperwork for you to fill out.  Next Medicare Annual Wellness Visit scheduled for next year: Yes  Understanding Your Risk for Falls Millions of people have serious injuries from falls each year. It is important to understand your risk of falling. Talk with your health care provider about your risk and what you can do to lower it. If you do have a serious fall, make sure to tell your provider. Falling once raises your risk of falling again. How can falls affect me? Serious injuries from falls are common. These include: Broken bones, such as hip fractures. Head injuries, such as traumatic brain  injuries (TBI) or concussions. A fear of falling can cause you to avoid activities and stay at home. This can make your muscles weaker and raise your risk for a fall. What can increase my risk? There are a number of risk factors that increase your risk for falling. The more risk factors you have, the higher your risk of falling. Serious injuries from a fall happen most often to people who are older than 67 years old. Teenagers and young adults ages 64-29 are also at higher risk. Common risk factors include: Weakness in the lower body. Being generally weak or confused due to long-term (chronic) illness. Dizziness or balance problems. Poor vision. Medicines that cause dizziness or drowsiness. These may include: Medicines for your blood pressure, heart, anxiety, insomnia, or swelling (edema). Pain medicines. Muscle relaxants. Other risk factors include: Drinking alcohol. Having had a fall in the past. Having foot pain or wearing improper footwear. Working at a dangerous job. Having any of the following in your home: Tripping hazards, such as floor clutter or loose rugs. Poor lighting. Pets. Having dementia or memory loss. What actions can I take to lower my risk of falling?     Physical activity Stay physically fit. Do strength and balance exercises. Consider taking a regular class to build strength and balance. Yoga and tai chi are good options. Vision Have your eyes checked every year and your prescription for glasses or contacts updated as needed. Shoes and walking aids Wear  non-skid shoes. Wear shoes that have rubber soles and low heels. Do not wear high heels. Do not walk around the house in socks or slippers. Use a cane or walker as told by your provider. Home safety Attach secure railings on both sides of your stairs. Install grab bars for your bathtub, shower, and toilet. Use a non-skid mat in your bathtub or shower. Attach bath mats securely with double-sided, non-slip  rug tape. Use good lighting in all rooms. Keep a flashlight near your bed. Make sure there is a clear path from your bed to the bathroom. Use night-lights. Do not use throw rugs. Make sure all carpeting is taped or tacked down securely. Remove all clutter from walkways and stairways, including extension cords. Repair uneven or broken steps and floors. Avoid walking on icy or slippery surfaces. Walk on the grass instead of on icy or slick sidewalks. Use ice melter to get rid of ice on walkways in the winter. Use a cordless phone. Questions to ask your health care provider Can you help me check my risk for a fall? Do any of my medicines make me more likely to fall? Should I take a vitamin D supplement? What exercises can I do to improve my strength and balance? Should I make an appointment to have my vision checked? Do I need a bone density test to check for weak bones (osteoporosis)? Would it help to use a cane or a walker? Where to find more information Centers for Disease Control and Prevention, STEAD[employer]: TonerPromos.no Community-Based Fall Prevention Programs: TonerPromos.no [employer] on Aging: BaseRingTones.pl Contact a health care provider if: You fall at home. You are afraid of falling at home. You feel weak, drowsy, or dizzy. This information is not intended to replace advice given to you by your health care provider. Make sure you discuss any questions you have with your health care provider. Document Revised: 09/05/2021 Document Reviewed: 09/05/2021 Elsevier Patient Education  2024 [employer]. Preventive Care 65 Years and Older, Male Preventive care refers to lifestyle choices and visits with your health care provider that can promote health and wellness. Preventive care visits are also called wellness exams. What can I expect for my preventive care visit? Counseling During your preventive care visit, your health care provider may ask about your: Medical history, including: Past  medical problems. Family medical history. History of falls. Current health, including: Emotional well-being. Home life and relationship well-being. Sexual activity. Memory and ability to understand (cognition). Lifestyle, including: Alcohol, nicotine or tobacco, and drug use. Access to firearms. Diet, exercise, and sleep habits. Work and work Astronomer. Sunscreen use. Safety issues such as seatbelt and bike helmet use. Physical exam Your health care provider will check your: Height and weight. These may be used to calculate your BMI (body mass index). BMI is a measurement that tells if you are at a healthy weight. Waist circumference. This measures the distance around your waistline. This measurement also tells if you are at a healthy weight and may help predict your risk of certain diseases, such as type 2 diabetes and high blood pressure. Heart rate and blood pressure. Body temperature. Skin for abnormal spots. What immunizations do I need?  Vaccines are usually given at various ages, according to a schedule. Your health care provider will recommend vaccines for you based on your age, medical history, and lifestyle or other factors, such as travel or where you work. What tests do I need? Screening Your health care provider may recommend screening tests for  certain conditions. This may include: Lipid and cholesterol levels. Diabetes screening. This is done by checking your blood sugar (glucose) after you have not eaten for a while (fasting). Hepatitis C test. Hepatitis B test. HIV (human immunodeficiency virus) test. STI (sexually transmitted infection) testing, if you are at risk. Lung cancer screening. Colorectal cancer screening. Prostate cancer screening. Abdominal aortic aneurysm (AAA) screening. You may need this if you are a current or former smoker. Talk with your health care provider about your test results, treatment options, and if necessary, the need for more  tests. Follow these instructions at home: Eating and drinking  Eat a diet that includes fresh fruits and vegetables, whole grains, lean protein, and low-fat dairy products. Limit your intake of foods with high amounts of sugar, saturated fats, and salt. Take vitamin and mineral supplements as recommended by your health care provider. Do not drink alcohol if your health care provider tells you not to drink. If you drink alcohol: Limit how much you have to 0-2 drinks a day. Know how much alcohol is in your drink. In the [place] one drink equals one 12 oz bottle of beer (355 mL), one 5 oz glass of wine (148 mL), or one 1 oz glass of hard liquor (44 mL). Lifestyle Brush your teeth every morning and night with fluoride toothpaste. Floss one time each day. Exercise for at least 30 minutes 5 or more days each week. Do not use any products that contain nicotine or tobacco. These products include cigarettes, chewing tobacco, and vaping devices, such as e-cigarettes. If you need help quitting, ask your health care provider. Do not use drugs. If you are sexually active, practice safe sex. Use a condom or other form of protection to prevent STIs. Take aspirin only as told by your health care provider. Make sure that you understand how much to take and what form to take. Work with your health care provider to find out whether it is safe and beneficial for you to take aspirin daily. Ask your health care provider if you need to take a cholesterol-lowering medicine (statin). Find healthy ways to manage stress, such as: Meditation, yoga, or listening to music. Journaling. Talking to a trusted person. Spending time with friends and family. Safety Always wear your seat belt while driving or riding in a vehicle. Do not drive: If you have been drinking alcohol. Do not ride with someone who has been drinking. When you are tired or distracted. While texting. If you have been using any mind-altering substances  or drugs. Wear a helmet and other protective equipment during sports activities. If you have firearms in your house, make sure you follow all gun safety procedures. Minimize exposure to UV radiation to reduce your risk of skin cancer. What's next? Visit your health care provider once a year for an annual wellness visit. Ask your health care provider how often you should have your eyes and teeth checked. Stay up to date on all vaccines. This information is not intended to replace advice given to you by your health care provider. Make sure you discuss any questions you have with your health care provider. Document Revised: 06/30/2020 Document Reviewed: 06/30/2020 Elsevier Patient Education  2024 [employer].

## 2022-12-12 ENCOUNTER — Other Ambulatory Visit (HOSPITAL_COMMUNITY): Payer: Self-pay

## 2022-12-12 MED ORDER — BISACODYL 5 MG PO TBEC
DELAYED_RELEASE_TABLET | ORAL | 0 refills | Status: DC
  Filled 2022-12-12: qty 4, 1d supply, fill #0

## 2022-12-12 MED ORDER — PEG 3350-KCL-NA BICARB-NACL 420 G PO SOLR
ORAL | 0 refills | Status: DC
  Filled 2022-12-12: qty 4000, 2d supply, fill #0

## 2022-12-25 ENCOUNTER — Ambulatory Visit (HOSPITAL_BASED_OUTPATIENT_CLINIC_OR_DEPARTMENT_OTHER): Payer: Medicare Other

## 2022-12-25 DIAGNOSIS — I10 Essential (primary) hypertension: Secondary | ICD-10-CM

## 2022-12-25 LAB — BASIC METABOLIC PANEL
BUN/Creatinine Ratio: 17 (ref 10–24)
BUN: 18 mg/dL (ref 8–27)
CO2: 22 mmol/L (ref 20–29)
Calcium: 9.5 mg/dL (ref 8.6–10.2)
Chloride: 105 mmol/L (ref 96–106)
Creatinine, Ser: 1.06 mg/dL (ref 0.76–1.27)
Glucose: 98 mg/dL (ref 70–99)
Potassium: 5.1 mmol/L (ref 3.5–5.2)
Sodium: 139 mmol/L (ref 134–144)
eGFR: 77 mL/min/{1.73_m2} (ref >59)

## 2022-12-25 LAB — HEPATIC FUNCTION PANEL
ALT: 20 [IU]/L (ref 0–44)
AST: 21 [IU]/L (ref 0–40)
Albumin: 4.1 g/dL (ref 3.9–4.9)
Alkaline Phosphatase: 76 [IU]/L (ref 44–121)
Bilirubin Total: 0.5 mg/dL (ref 0.0–1.2)
Bilirubin, Direct: <0.08 mg/dL (ref 0.00–0.40)
Total Protein: 7 g/dL (ref 6.0–8.5)

## 2022-12-25 LAB — CATECHOLAMINES, FRACTIONATED, PLASMA
Dopamine: <30 pg/mL (ref 0–48)
Epinephrine: <15 pg/mL (ref 0–62)
Norepinephrine: 662 pg/mL (ref 0–874)

## 2022-12-25 LAB — LIPID PANEL
Chol/HDL Ratio: 3.5 {ratio} (ref 0.0–5.0)
Cholesterol, Total: 187 mg/dL (ref 100–199)
HDL: 53 mg/dL (ref >39)
LDL Chol Calc (NIH): 117 mg/dL — ABNORMAL HIGH (ref 0–99)
Triglycerides: 95 mg/dL (ref 0–149)
VLDL Cholesterol Cal: 17 mg/dL (ref 5–40)

## 2022-12-25 LAB — METANEPHRINES, PLASMA
Metanephrine, Free: <25 pg/mL (ref 0.0–88.0)
Normetanephrine, Free: 78.5 pg/mL (ref 0.0–285.2)

## 2022-12-26 ENCOUNTER — Telehealth (HOSPITAL_BASED_OUTPATIENT_CLINIC_OR_DEPARTMENT_OTHER): Payer: Self-pay

## 2022-12-26 ENCOUNTER — Other Ambulatory Visit (HOSPITAL_COMMUNITY): Payer: Self-pay

## 2022-12-26 ENCOUNTER — Encounter (HOSPITAL_BASED_OUTPATIENT_CLINIC_OR_DEPARTMENT_OTHER): Payer: Self-pay

## 2022-12-26 DIAGNOSIS — E785 Hyperlipidemia, unspecified: Secondary | ICD-10-CM

## 2022-12-26 DIAGNOSIS — I25118 Atherosclerotic heart disease of native coronary artery with other forms of angina pectoris: Secondary | ICD-10-CM

## 2022-12-26 MED ORDER — ROSUVASTATIN CALCIUM 10 MG PO TABS
10.0000 mg | ORAL_TABLET | ORAL | 3 refills | Status: AC
  Filled 2022-12-26: qty 36, 84d supply, fill #0
  Filled 2023-03-23: qty 36, 84d supply, fill #1
  Filled 2023-06-11: qty 36, 84d supply, fill #2
  Filled 2023-09-13: qty 36, 84d supply, fill #3
  Filled 2023-12-14: qty 36, 84d supply, fill #4

## 2022-12-26 NOTE — Telephone Encounter (Signed)
-----   Message from Alver Sorrow sent at 12/26/2022  8:08 AM EST ----- Right renal artery 1-59% stenosed.  No left renal artery stenosis.  Recommend rosuvastatin to prevent progression.  As noted on lab results, if not taking start rosuvastatin 10 mg 3 times per week.  If taking rosuvastatin, increase from 3 times per week to daily.  Recommend repeat renal artery duplex in 1 year for monitoring.  The stenosis prompts [place] to pay attention but does not require intervention and would not be contributory to elevated blood pressure.

## 2022-12-26 NOTE — Telephone Encounter (Signed)
Renal artery duplex ordered to be done within 1 year for monitoring.

## 2022-12-27 LAB — HM COLONOSCOPY

## 2023-01-24 ENCOUNTER — Other Ambulatory Visit (HOSPITAL_COMMUNITY): Payer: Self-pay

## 2023-01-24 ENCOUNTER — Encounter (INDEPENDENT_AMBULATORY_CARE_PROVIDER_SITE_OTHER): Payer: Self-pay | Admitting: Family Medicine

## 2023-01-24 ENCOUNTER — Ambulatory Visit (INDEPENDENT_AMBULATORY_CARE_PROVIDER_SITE_OTHER): Payer: Medicare Other | Admitting: Family Medicine

## 2023-01-24 VITALS — BP 148/89 | HR 58 | Temp 97.9°F | Ht 68.0 in | Wt 308.0 lb

## 2023-01-24 DIAGNOSIS — I10 Essential (primary) hypertension: Secondary | ICD-10-CM

## 2023-01-24 DIAGNOSIS — E669 Obesity, unspecified: Secondary | ICD-10-CM

## 2023-01-24 DIAGNOSIS — R7303 Prediabetes: Secondary | ICD-10-CM | POA: Diagnosis not present

## 2023-01-24 DIAGNOSIS — Z6841 Body Mass Index (BMI) 40.0 and over, adult: Secondary | ICD-10-CM

## 2023-01-24 MED ORDER — METFORMIN HCL ER 500 MG PO TB24
500.0000 mg | ORAL_TABLET | Freq: Every day | ORAL | 0 refills | Status: DC
  Filled 2023-01-24: qty 90, 90d supply, fill #0

## 2023-01-24 NOTE — Progress Notes (Signed)
 .smr  Office: [phone]  /  Fax: [phone]  WEIGHT SUMMARY AND BIOMETRICS  Anthropometric Measurements Height: 5' 8 (1.727 m) Weight: (!) 308 lb (139.7 kg) BMI (Calculated): 46.84 Weight at Last Visit: 296 lb Weight Lost Since Last Visit: 0 Weight Gained Since Last Visit: 12 lb Starting Weight: 317 lb Total Weight Loss (lbs): 9 lb (4.082 kg)   Body Composition  Body Fat %: 43.3 % Fat Mass (lbs): 133.4 lbs Muscle Mass (lbs): 166 lbs Total Body Water (lbs): 139.4 lbs Visceral Fat Rating : 32   Other Clinical Data Fasting: No Labs: No Today's Visit #: 25 Starting Date: 02/02/21    Chief Complaint: OBESITY   History of Present Illness   The patient, with a history of prediabetes, hypertension, and obesity, presents for a follow-up visit after a two-month interval. He reports a weight gain of twelve pounds over the holiday season. His blood pressure was elevated at the visit, with a repeat measurement showing some improvement but still above the normal range. He is currently on metformin  for prediabetes and valsartan  for hypertension.  The patient also reports some edema in his legs. An ultrasound of the kidney showed mild stenosis in the right renal artery, but it was not considered the source of his blood pressure issues. He has been taking Norvasc , which he acknowledges could contribute to fluid retention.  Regarding his prediabetes management, the patient experienced a significant drop in energy levels when taking two metformin  XLs, leading him to reduce the dose back to 500mg . He expresses difficulty in controlling his diet, particularly due to differing meal schedules and dietary preferences between him and his partner. He acknowledges the need for more aggressive dietary control and expresses a commitment to improving his diet.  The patient also mentions a recent colonoscopy, which was negative, and a negative COVID test after a recent trip to [place] . He  reports being generally active, with activities including yard work and frequent visits to antique places. He also mentions some sleep pattern improvements since getting married.          PHYSICAL EXAM:  Blood pressure (!) 148/89, pulse (!) 58, temperature 97.9 F (36.6 C), height 5' 8 (1.727 m), weight (!) 308 lb (139.7 kg), SpO2 97%. Body mass index is 46.83 kg/m.  DIAGNOSTIC DATA REVIEWED:  BMET    Component Value Date/Time   NA 139 12/21/2022 0820   K 5.1 12/21/2022 0820   CL 105 12/21/2022 0820   CO2 22 12/21/2022 0820   GLUCOSE 98 12/21/2022 0820   GLUCOSE 105 (H) 11/09/2022 0914   BUN 18 12/21/2022 0820   CREATININE 1.06 12/21/2022 0820   CREATININE 0.99 06/05/2018 1352   CALCIUM  9.5 12/21/2022 0820   GFRNONAA 43 (L) 02/16/2016 1839   GFRAA 49 (L) 02/16/2016 1839   Lab Results  Component Value Date   HGBA1C 5.4 11/09/2022   HGBA1C 5.6 01/23/2017   Lab Results  Component Value Date   INSULIN  19.4 07/25/2021   INSULIN  20.6 02/02/2021   Lab Results  Component Value Date   TSH 2.26 11/09/2022   CBC    Component Value Date/Time   WBC 6.8 11/09/2022 0914   RBC 5.18 11/09/2022 0914   HGB 15.1 11/09/2022 0914   HGB 15.7 07/25/2021 0742   HCT 46.9 11/09/2022 0914   HCT 46.8 07/25/2021 0742   PLT 143.0 (L) 11/09/2022 0914   PLT 156 07/25/2021 0742   MCV 90.5 11/09/2022 0914   MCV 89 07/25/2021 0742  MCH 29.9 07/25/2021 0742   MCH 29.4 03/31/2020 1510   MCHC 32.1 11/09/2022 0914   RDW 14.4 11/09/2022 0914   RDW 13.1 07/25/2021 0742   Iron Studies    Component Value Date/Time   IRON 108 03/31/2020 1510   TIBC 307 03/31/2020 1510   FERRITIN 451 (H) 03/31/2020 1510   IRONPCTSAT 35 03/31/2020 1510   Lipid Panel     Component Value Date/Time   CHOL 187 12/21/2022 0820   TRIG 95 12/21/2022 0820   HDL 53 12/21/2022 0820   CHOLHDL 3.5 12/21/2022 0820   CHOLHDL 3 11/09/2022 0914   VLDL 11.6 11/09/2022 0914   LDLCALC 117 (H) 12/21/2022 0820    LDLCALC 104 (H) 06/05/2018 1352   Hepatic Function Panel     Component Value Date/Time   PROT 7.0 12/21/2022 0820   ALBUMIN  4.1 12/21/2022 0820   AST 21 12/21/2022 0820   ALT 20 12/21/2022 0820   ALKPHOS 76 12/21/2022 0820   BILITOT 0.5 12/21/2022 0820   BILIDIR <0.08 12/21/2022 0820      Component Value Date/Time   TSH 2.26 11/09/2022 0914   Nutritional Lab Results  Component Value Date   VD25OH 39.95 11/09/2022   VD25OH 59.5 07/25/2021   VD25OH 61.59 01/03/2021     Assessment and Plan    Hypertension Hypertension with recent elevated readings. Current medications include valsartan  and occasional hydrochlorothiazide . Recent travel, holiday diet, and sodium intake may have contributed to elevated readings. Right renal artery stenosis noted on ultrasound but not considered the primary cause. Discussed potential fluid retention side effect of Norvasc  and the need for consistent use of hydrochlorothiazide  for better control. - Follow up with hypertension clinic next Thursday - Obtain blood work including cholesterol levels prior to the hypertension clinic visit -Continue diet/exercise/weight loss efforts  Prediabetes Prediabetes managed with metformin  500 mg. Reports difficulty controlling diet, especially during holidays, leading to weight gain. Noted adverse reaction to higher doses of metformin , resulting in hypoglycemic episodes. - Continue metformin  500 mg - Send in metformin  prescription refill -Work on diet/exercise/weight loss to help prevent DM  Obesity Weight gain of 12 pounds over the holidays. Acknowledges difficulty in controlling diet and maintaining a routine due to lifestyle and environmental factors. Discussed the importance of dietary control and the challenges posed by environmental factors such as availability of comfort foods and irregular meal schedules. - Encourage dietary control and stability - Discuss strategies for maintaining a healthy diet despite  environmental challenges - Consider more frequent follow-ups for accountability  General Health Maintenance Generally active and enjoys walking and other activities. Discussed the importance of staying active and being cautious in cold or snowy conditions. - Encourage continued physical activity - Advise caution during cold or snowy weather  Follow-up - Schedule follow-up appointment in 6-8 weeks.       He was informed of the importance of frequent follow up visits to maximize his success with intensive lifestyle modifications for his multiple health conditions.    Louann Penton, MD

## 2023-01-29 LAB — BASIC METABOLIC PANEL
BUN/Creatinine Ratio: 16 (ref 10–24)
BUN: 19 mg/dL (ref 8–27)
CO2: 24 mmol/L (ref 20–29)
Calcium: 9.7 mg/dL (ref 8.6–10.2)
Chloride: 103 mmol/L (ref 96–106)
Creatinine, Ser: 1.16 mg/dL (ref 0.76–1.27)
Glucose: 84 mg/dL (ref 70–99)
Potassium: 4.6 mmol/L (ref 3.5–5.2)
Sodium: 141 mmol/L (ref 134–144)
eGFR: 69 mL/min/{1.73_m2} (ref >59)

## 2023-01-29 LAB — HEPATIC FUNCTION PANEL
ALT: 21 [IU]/L (ref 0–44)
AST: 22 [IU]/L (ref 0–40)
Albumin: 4.5 g/dL (ref 3.9–4.9)
Alkaline Phosphatase: 77 [IU]/L (ref 44–121)
Bilirubin Total: 0.7 mg/dL (ref 0.0–1.2)
Bilirubin, Direct: 0.22 mg/dL (ref 0.00–0.40)
Total Protein: 6.9 g/dL (ref 6.0–8.5)

## 2023-01-29 LAB — LIPID PANEL
Chol/HDL Ratio: 3.1 {ratio} (ref 0.0–5.0)
Cholesterol, Total: 161 mg/dL (ref 100–199)
HDL: 52 mg/dL (ref >39)
LDL Chol Calc (NIH): 89 mg/dL (ref 0–99)
Triglycerides: 113 mg/dL (ref 0–149)
VLDL Cholesterol Cal: 20 mg/dL (ref 5–40)

## 2023-01-29 LAB — METANEPHRINES, PLASMA
Metanephrine, Free: <25 pg/mL (ref 0.0–88.0)
Normetanephrine, Free: 114.6 pg/mL (ref 0.0–285.2)

## 2023-01-29 LAB — CATECHOLAMINES, FRACTIONATED, PLASMA
Dopamine: 35 pg/mL (ref 0–48)
Epinephrine: <15 pg/mL (ref 0–62)
Norepinephrine: 1217 pg/mL — ABNORMAL HIGH (ref 0–874)

## 2023-02-01 ENCOUNTER — Encounter (HOSPITAL_BASED_OUTPATIENT_CLINIC_OR_DEPARTMENT_OTHER): Payer: Self-pay

## 2023-02-01 ENCOUNTER — Other Ambulatory Visit (HOSPITAL_COMMUNITY): Payer: Self-pay

## 2023-02-01 ENCOUNTER — Ambulatory Visit (INDEPENDENT_AMBULATORY_CARE_PROVIDER_SITE_OTHER): Payer: Medicare Other | Admitting: Family

## 2023-02-01 ENCOUNTER — Encounter (HOSPITAL_BASED_OUTPATIENT_CLINIC_OR_DEPARTMENT_OTHER): Payer: Self-pay | Admitting: Family

## 2023-02-01 VITALS — BP 175/96 | HR 57 | Ht 69.0 in | Wt 315.0 lb

## 2023-02-01 DIAGNOSIS — I10 Essential (primary) hypertension: Secondary | ICD-10-CM | POA: Diagnosis not present

## 2023-02-01 DIAGNOSIS — G4733 Obstructive sleep apnea (adult) (pediatric): Secondary | ICD-10-CM

## 2023-02-01 DIAGNOSIS — E785 Hyperlipidemia, unspecified: Secondary | ICD-10-CM

## 2023-02-01 DIAGNOSIS — I25118 Atherosclerotic heart disease of native coronary artery with other forms of angina pectoris: Secondary | ICD-10-CM | POA: Diagnosis not present

## 2023-02-01 DIAGNOSIS — I1A0 Resistant hypertension: Secondary | ICD-10-CM

## 2023-02-01 MED ORDER — VALSARTAN 320 MG PO TABS
320.0000 mg | ORAL_TABLET | Freq: Every day | ORAL | 2 refills | Status: DC
  Filled 2023-02-01: qty 30, 30d supply, fill #0
  Filled 2023-03-24: qty 30, 30d supply, fill #1

## 2023-02-01 MED ORDER — HYDROCHLOROTHIAZIDE 25 MG PO TABS
25.0000 mg | ORAL_TABLET | Freq: Every day | ORAL | 3 refills | Status: DC
  Filled 2023-02-01: qty 90, 90d supply, fill #0

## 2023-02-01 NOTE — Patient Instructions (Signed)
Medication Instructions:  Your physician has recommended you make the following change in your medication:   TAKE Valsartan 320 mg daily   Labwork: Your physician recommends that you return for lab work in 2 weeks: BMET, RENIN & ALDOSTERONE   Follow-Up: Follow up in 2 months in hypertension clinic   Special Instructions:

## 2023-02-01 NOTE — Progress Notes (Signed)
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   Prior coronary calcium score 04/2020 of 15 placing him in the 33rd percentile. Mild dilation of 41mm. Established with [facility] 11/30/22. Keith Champagne, MD was diagnosed with hypertension years ago, but it worsened over the last two years. No tobacco use and occasional social alcohol use. He was not routinely following low sodium diet but was walking with his wife for exercise. Taking hydrochlorothiazide very infrequently as previously prescribed for kidney stones.  Using oral device to treat OSA. At his initial visit Losartan transitioned to Valsartan. Catecholamines, metanephrines unremarkable.  Renal artery duplex 12/25/2018 for the right 1-59% renal artery stenosis.  Rosuvastatin was initiated 3 times per week due to coronary artery disease.  Presents today for follow-up independently.  He is pleased that his LDL is now 89 after approximately 2 months of taking rosuvastatin 3 times per week. Checked blood pressure a couple times at home and has still been elevated.  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  Previous antihypertensives:  Past Medical History:  Diagnosis Date   Acute meniscal tear of left knee    Allergy    fire ants   Back pain    Complication of anesthesia    Coronary artery disease    Edema of both lower extremities    Fatigue     History of kidney stones    Hyperlipidemia    Hypertension    Joint pain    Osteoarthritis    PONV (postoperative nausea and vomiting)    Sleep apnea    SOB (shortness of breath) on exertion    Vitamin D deficiency     Past Surgical History:  Procedure Laterality Date   BACK SURGERY     laminectomy L5-S1   COLONOSCOPY WITH PROPOFOL N/A 03/14/2017   Procedure: COLONOSCOPY WITH PROPOFOL;  Surgeon: Vida Rigger, MD;  Location: [facility];  Service: Endoscopy;  Laterality: N/A;   Colonscopy     polyps   CYSTOSCOPY KIDNEY W/ URETERAL GUIDE WIRE  2010   lithotripsy   CYSTOSCOPY/RETROGRADE/URETEROSCOPY/STONE EXTRACTION WITH BASKET Right 02/16/2016   Procedure: CYSTOSCOPY/RETROGRADE/RIGHT FLEXIBLE URETEROSCOPY/STONE EXTRACTION WITH BASKET;  Surgeon: Heloise Purpura, MD;  Location: [facility];  Service: Urology;  Laterality: Right;   HOLMIUM LASER APPLICATION Left 02/16/2016   Procedure: HOLMIUM LASER APPLICATION;  Surgeon: Heloise Purpura, MD;  Location: [facility];  Service: Urology;  Laterality: Left;   KNEE ARTHROSCOPY Right 2013   KNEE ARTHROSCOPY WITH MEDIAL MENISECTOMY Left 03/12/2015   Procedure: LEFT KNEE ARTHROSCOPY WITH PARTIAL MEDIAL MENISCECTOMY;  Surgeon: Kathryne Hitch, MD;  Location: [facility];  Service: Orthopedics;  Laterality: Left;   LAMINECTOMY  1999   MR HIPS BILATERAL     2009&2010   TOTAL HIP REVISION  09/15/2011   Procedure: TOTAL HIP REVISION;  Surgeon: Kathryne Hitch, MD;  Location: [facility];  Service: Orthopedics;  Laterality:  Right;  Revision right hip acetabular component    Current Medications: Current Meds  Medication Sig   allopurinol (ZYLOPRIM) 300 MG tablet Take 1 tablet (300 mg total) by mouth daily.   amLODipine (NORVASC) 5 MG tablet Take 1 tablet (5 mg total) by mouth daily.   Cholecalciferol (VITAMIN D3) 50 MCG (2000 UT) TABS Take by mouth daily.   ketoconazole 2%-triamcinolone 0.1% 1:2 cream mixture Apply topically daily as needed   Loratadine 10 MG  CAPS Take 10 mg by mouth at bedtime.    metFORMIN (GLUCOPHAGE-XR) 500 MG 24 hr tablet Take 1 tablet (500 mg total) by mouth daily with breakfast.   rosuvastatin (CRESTOR) 10 MG tablet Take 1 tablet (10 mg total) by mouth 3 (three) times a week.   tadalafil (CIALIS) 10 MG tablet Take 1 tablet (10 mg total) by mouth every other day as needed for erectile dysfunction.   [DISCONTINUED] hydrochlorothiazide (HYDRODIURIL) 25 MG tablet Take 1 tablet (25 mg total) by mouth daily.   [DISCONTINUED] valsartan (DIOVAN) 160 MG tablet Take 1 tablet (160 mg total) by mouth daily.     Allergies:   Patient has no known allergies.   Social History   Socioeconomic History   Marital status: Married    Spouse name: Not on file   Number of children: Not on file   Years of education: Not on file   Highest education level: Not on file  Occupational History   Not on file  Tobacco Use   Smoking status: Never   Smokeless tobacco: Never  Vaping Use   Vaping status: Never Used  Substance and Sexual Activity   Alcohol use: Yes    Alcohol/week: 7.0 standard drinks of alcohol    Types: 7 Glasses of wine per week    Comment: glass of wine per night   Drug use: No   Sexual activity: Not on file  Other Topics Concern   Not on file  Social History Narrative   Not on file   Social Drivers of Health   Financial Resource Strain: Low Risk  (12/01/2022)   Overall Financial Resource Strain (CARDIA)    Difficulty of Paying Living Expenses: Not hard at all  Food Insecurity: No Food Insecurity (12/01/2022)   Hunger Vital Sign    Worried About Running Out of Food in the Last Year: Never true    Ran Out of Food in the Last Year: Never true  Transportation Needs: No Transportation Needs (12/01/2022)   PRAPARE - Administrator, Civil Service (Medical): No    Lack of Transportation (Non-Medical): No  Physical Activity: Sufficiently Active (12/01/2022)   Exercise Vital Sign    Days of Exercise per Week: 7  days    Minutes of Exercise per Session: 30 min  Stress: No Stress Concern Present (12/01/2022)   [employer] of Occupational Health - Occupational Stress Questionnaire    Feeling of Stress : Not at all  Social Connections: Socially Integrated (12/01/2022)   Social Connection and Isolation Panel [NHANES]    Frequency of Communication with Friends and Family: More than three times a week    Frequency of Social Gatherings with Friends and Family: More than three times a week    Attends Religious Services: More than 4 times per year    Active Member of [employer] or Organizations: Yes    Attends Engineer, structural: More than 4 times per year    Marital Status: Married     Family History:  The patient's family history includes Anemia in his father; Cancer in his father; Colon cancer in his father; Diabetes in his father and mother; Heart attack in his sister; Heart disease in his mother; Heart failure in his brother; Hypertension in his father and mother; Kidney failure in his mother; Obesity in his brother, brother, brother, brother, father, mother, and sister; Prostatitis in his brother; Sleep apnea in his brother; Stroke in his mother. There is no history of Prostate cancer.  ROS:   Please see the history of present illness.    All other systems reviewed and are negative.  EKGs/Labs/Other Studies Reviewed:         Recent Labs: 11/09/2022: Hemoglobin 15.1; Platelets 143.0; TSH 2.26 01/24/2023: ALT 21; BUN 19; Creatinine, Ser 1.16; Potassium 4.6; Sodium 141   Recent Lipid Panel    Component Value Date/Time   CHOL 161 01/24/2023 1436   TRIG 113 01/24/2023 1436   HDL 52 01/24/2023 1436   CHOLHDL 3.1 01/24/2023 1436   CHOLHDL 3 11/09/2022 0914   VLDL 11.6 11/09/2022 0914   LDLCALC 89 01/24/2023 1436   LDLCALC 104 (H) 06/05/2018 1352    Physical Exam:   VS:  BP (!) 175/96   Pulse (!) 57   Ht 5\' 9"  (1.753 m)   Wt (!) 315 lb (142.9 kg)   SpO2 96%   BMI 46.52 kg/m  ,  BMI Body mass index is 46.52 kg/m. GENERAL:  Well appearing HEENT: Pupils equal round and reactive, fundi not visualized, oral mucosa unremarkable NECK:  No jugular venous distention, waveform within normal limits, carotid upstroke brisk and symmetric, no bruits, no thyromegaly LYMPHATICS:  No cervical adenopathy LUNGS:  Clear to auscultation bilaterally HEART:  RRR.  PMI not displaced or sustained,S1 and S2 within normal limits, no S3, no S4, no clicks, no rubs, no murmurs ABD:  Flat, positive bowel sounds normal in frequency in pitch, no bruits, no rebound, no guarding, no midline pulsatile mass, no hepatomegaly, no splenomegaly EXT:  2 plus pulses throughout, no edema, no cyanosis no clubbing SKIN:  No rashes no nodules NEURO:  Cranial nerves II through XII grossly intact, motor grossly intact throughout PSYCH:  Cognitively intact, oriented to person place and time   ASSESSMENT/PLAN:    HTN - BP not at goal <130/80.  Increase valsartan to 320 mg daily.  Consider Amlodipine-Valsartan in the future. Due to intermittent LE edema, will defer further increasing Amlodipine.  Increase hydrochlorothiazide from as needed to daily. Labs in one week: BMP, renin/aldosterone in one week. Discussed to monitor BP at home at least 2 hours after medications and sitting for 5-10 minutes.  Right renal artery stenosis -renal duplex 11/2022 with right 1-59% stenosis.  Continue rosuvastatin to prevent progression.  Consider repeat imaging in 1 year for monitoring.  Nonobstructive CAD / HLD, LDL goal <70 - 04/2020 calcium score of 15 with mild dilation ascending aorta 41mm. 11/09/22 LDL 101.  01/2023 LDL 89.  Goal LDL < 70.  Prefers to continue working on lifestyle changes and repeat lipids in 6 months to reassess.  Recommend aiming for 150 minutes of moderate intensity activity per week and following a heart healthy diet.    OSA - Treated with dental device.   Screening for Secondary Hypertension:      11/30/2022    4:00 PM  Causes  Renovascular HTN Screened  Sleep Apnea Screened     - Comments treated  Thyroid Disease Screened  Hyperaldosteronism Screened  Pheochromocytoma Screened  Cushing's  Syndrome N/A  Coarctation of the Aorta N/A  Compliance Screened    Relevant Labs/Studies:    Latest Ref Rng & Units 01/24/2023    2:36 PM 12/21/2022    8:20 AM 11/09/2022    9:14 AM  Basic Labs  Sodium 134 - 144 mmol/L 141  139  140   Potassium 3.5 - 5.2 mmol/L 4.6  5.1  4.4   Creatinine 0.76 - 1.27 mg/dL 2.54  2.70  6.23        Latest Ref Rng & Units 11/09/2022    9:14 AM 01/03/2021   10:22 AM  Thyroid   TSH 0.35 - 5.50 uIU/mL 2.26  2.63           Latest Ref Rng & Units 01/24/2023    2:36 PM 12/21/2022    8:20 AM  Metanephrines/Catecholamines   Epinephrine 0 - 62 pg/mL <15  <15   Norepinephrine 0 - 874 pg/mL 1,217  662   Dopamine 0 - 48 pg/mL 35  <30   Metanephrines 0.0 - 88.0 pg/mL <25.0  <25.0   Normetanephrines  0.0 - 285.2 pg/mL 114.6  78.5           12/26/2022   11:18 AM  Renovascular   Renal Artery [place] Completed Yes      Disposition:    FU with MD/PharmD/APP in 2 months    Medication Adjustments/Labs and Tests Ordered: Current medicines are reviewed at length with the patient today.  Concerns regarding medicines are outlined above.  Orders Placed This Encounter  Procedures   Basic metabolic panel   Aldosterone + renin activity w/ ratio   Meds ordered this encounter  Medications   hydrochlorothiazide (HYDRODIURIL) 25 MG tablet    Sig: Take 1 tablet (25 mg total) by mouth daily.    Dispense:  90 tablet    Refill:  3    Supervising Provider:   Jodelle Red [[id number]]   valsartan (DIOVAN) 320 MG tablet    Sig: Take 1 tablet (320 mg total) by mouth daily.    Dispense:  30 tablet    Refill:  2    Supervising Provider:   Jodelle Red [[id number]]     Signed, Alver Sorrow, NP  02/04/2023 1:03 PM    [facility]

## 2023-02-04 ENCOUNTER — Encounter (HOSPITAL_BASED_OUTPATIENT_CLINIC_OR_DEPARTMENT_OTHER): Payer: Self-pay | Admitting: Family

## 2023-02-27 LAB — BASIC METABOLIC PANEL
BUN/Creatinine Ratio: 20 (ref 10–24)
BUN: 24 mg/dL (ref 8–27)
CO2: 23 mmol/L (ref 20–29)
Calcium: 9.6 mg/dL (ref 8.6–10.2)
Chloride: 102 mmol/L (ref 96–106)
Creatinine, Ser: 1.22 mg/dL (ref 0.76–1.27)
Glucose: 97 mg/dL (ref 70–99)
Potassium: 4.7 mmol/L (ref 3.5–5.2)
Sodium: 140 mmol/L (ref 134–144)
eGFR: 65 mL/min/{1.73_m2} (ref >59)

## 2023-02-27 LAB — ALDOSTERONE + RENIN ACTIVITY W/ RATIO
Aldos/Renin Ratio: 0.8 (ref 0.0–30.0)
Aldosterone: 4.7 ng/dL (ref 0.0–30.0)
Renin Activity, Plasma: 5.839 ng/mL/h — ABNORMAL HIGH (ref 0.167–5.380)

## 2023-02-28 ENCOUNTER — Encounter (HOSPITAL_BASED_OUTPATIENT_CLINIC_OR_DEPARTMENT_OTHER): Payer: Self-pay

## 2023-03-09 ENCOUNTER — Other Ambulatory Visit (HOSPITAL_COMMUNITY): Payer: Self-pay

## 2023-03-09 MED ORDER — SPIRONOLACTONE 25 MG PO TABS
25.0000 mg | ORAL_TABLET | Freq: Every day | ORAL | 3 refills | Status: AC
  Filled 2023-03-09: qty 90, 90d supply, fill #0
  Filled 2023-06-11: qty 90, 90d supply, fill #1
  Filled 2023-09-13: qty 90, 90d supply, fill #2
  Filled 2024-01-11: qty 90, 90d supply, fill #3

## 2023-03-09 NOTE — Addendum Note (Signed)
Addended by: Alver Sorrow on: 03/09/2023 02:01 PM   Modules accepted: Orders

## 2023-03-13 LAB — HM DIABETES EYE EXAM

## 2023-03-22 ENCOUNTER — Other Ambulatory Visit (HOSPITAL_COMMUNITY): Payer: Self-pay

## 2023-03-22 ENCOUNTER — Encounter (INDEPENDENT_AMBULATORY_CARE_PROVIDER_SITE_OTHER): Payer: Self-pay | Admitting: Family Medicine

## 2023-03-22 ENCOUNTER — Ambulatory Visit (INDEPENDENT_AMBULATORY_CARE_PROVIDER_SITE_OTHER): Payer: Medicare Other | Admitting: Family Medicine

## 2023-03-22 VITALS — BP 162/85 | HR 67 | Temp 97.6°F | Ht 68.0 in | Wt 312.0 lb

## 2023-03-22 DIAGNOSIS — I10 Essential (primary) hypertension: Secondary | ICD-10-CM

## 2023-03-22 DIAGNOSIS — Z6841 Body Mass Index (BMI) 40.0 and over, adult: Secondary | ICD-10-CM

## 2023-03-22 DIAGNOSIS — I25810 Atherosclerosis of coronary artery bypass graft(s) without angina pectoris: Secondary | ICD-10-CM

## 2023-03-22 DIAGNOSIS — G4733 Obstructive sleep apnea (adult) (pediatric): Secondary | ICD-10-CM

## 2023-03-22 DIAGNOSIS — I25119 Atherosclerotic heart disease of native coronary artery with unspecified angina pectoris: Secondary | ICD-10-CM | POA: Diagnosis not present

## 2023-03-22 DIAGNOSIS — E669 Obesity, unspecified: Secondary | ICD-10-CM

## 2023-03-22 MED ORDER — TIRZEPATIDE-WEIGHT MANAGEMENT 2.5 MG/0.5ML ~~LOC~~ SOAJ
2.5000 mg | SUBCUTANEOUS | 0 refills | Status: DC
  Filled 2023-03-22 – 2023-04-12 (×2): qty 2, 28d supply, fill #0

## 2023-03-22 NOTE — Progress Notes (Signed)
 Office: [phone]  /  Fax: [phone]  WEIGHT SUMMARY AND BIOMETRICS  Anthropometric Measurements Height: 5\' 8"  (1.727 m) Weight: (!) 312 lb (141.5 kg) BMI (Calculated): 47.45 Weight at Last Visit: 308lb Weight Lost Since Last Visit: 0 Weight Gained Since Last Visit: 4lb Starting Weight: 317lb Total Weight Loss (lbs): 5 lb (2.268 kg)   Body Composition  Body Fat %: 43.2 % Fat Mass (lbs): 134.8 lbs Muscle Mass (lbs): 168.6 lbs Total Body Water (lbs): 139.2 lbs Visceral Fat Rating : 32   Other Clinical Data Fasting: no Labs: no Today's Visit #: 26 Starting Date: 02/02/21    Chief Complaint: OBESITY    History of Present Illness   Kerrin Champagne, 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  He has gained four pounds over the last two months and is following his category three plan intermittently, about fifty percent of the time. He exercises by walking for about thirty minutes, two times per week. He eats out three to four times a week and acknowledges that his diet includes foods he tries to avoid, such as pasta and potatoes. His refrigerator is full, limiting space for meal prep, and he consumes beer and cream.  He has a history of uncontrolled hypertension, which remains uncontrolled. He is under the care of cardiology and a hypertensive clinic. His current medications include valsartan, which was increased to 320 mg, hydrochlorothiazide taken daily, spironolactone 25 mg, and amlodipine 5 mg twice a day. He reports blood pressures in the 160s/80s and has not seen readings in the 140s or 150s for a long time. He attributes some weight gain to edema, possibly related to amlodipine.  He is managing sleep apnea with a dental device, though it may not be fully effective. His partner, Vernona Rieger, reports periods of apnea and gasping during sleep. He has not had his overnight oxygen levels measured  recently. He acknowledges that weight loss could improve his sleep apnea.          PHYSICAL EXAM:  Blood pressure (!) 162/85, pulse 67, temperature 97.6 F (36.4 C), height 5\' 8"  (1.727 m), weight (!) 312 lb (141.5 kg), SpO2 98%. Body mass index is 47.44 kg/m.  DIAGNOSTIC DATA REVIEWED:  BMET    Component Value Date/Time   NA 140 02/15/2023 1023   K 4.7 02/15/2023 1023   CL 102 02/15/2023 1023   CO2 23 02/15/2023 1023   GLUCOSE 97 02/15/2023 1023   GLUCOSE 105 (H) 11/09/2022 0914   BUN 24 02/15/2023 1023   CREATININE 1.22 02/15/2023 1023   CREATININE 0.99 06/05/2018 1352   CALCIUM 9.6 02/15/2023 1023   GFRNONAA 43 (L) 02/16/2016 1839   GFRAA 49 (L) 02/16/2016 1839   Lab Results  Component Value Date   HGBA1C 5.4 11/09/2022   HGBA1C 5.6 01/23/2017   Lab Results  Component Value Date   INSULIN 19.4 07/25/2021   INSULIN 20.6 02/02/2021   Lab Results  Component Value Date   TSH 2.26 11/09/2022   CBC    Component Value Date/Time   WBC 6.8 11/09/2022 0914   RBC 5.18 11/09/2022 0914   HGB 15.1 11/09/2022 0914   HGB 15.7 07/25/2021 0742   HCT 46.9 11/09/2022 0914   HCT 46.8 07/25/2021 0742   PLT 143.0 (L) 11/09/2022 0914   PLT 156 07/25/2021 0742   MCV 90.5 11/09/2022 0914   MCV 89 07/25/2021 0742   MCH 29.9 07/25/2021  0742   MCH 29.4 03/31/2020 1510   MCHC 32.1 11/09/2022 0914   RDW 14.4 11/09/2022 0914   RDW 13.1 07/25/2021 0742   Iron Studies    Component Value Date/Time   IRON 108 03/31/2020 1510   TIBC 307 03/31/2020 1510   FERRITIN 451 (H) 03/31/2020 1510   IRONPCTSAT 35 03/31/2020 1510   Lipid Panel     Component Value Date/Time   CHOL 161 01/24/2023 1436   TRIG 113 01/24/2023 1436   HDL 52 01/24/2023 1436   CHOLHDL 3.1 01/24/2023 1436   CHOLHDL 3 11/09/2022 0914   VLDL 11.6 11/09/2022 0914   LDLCALC 89 01/24/2023 1436   LDLCALC 104 (H) 06/05/2018 1352   Hepatic Function Panel     Component Value Date/Time   PROT 6.9 01/24/2023  1436   ALBUMIN 4.5 01/24/2023 1436   AST 22 01/24/2023 1436   ALT 21 01/24/2023 1436   ALKPHOS 77 01/24/2023 1436   BILITOT 0.7 01/24/2023 1436   BILIDIR 0.22 01/24/2023 1436      Component Value Date/Time   TSH 2.26 11/09/2022 0914   Nutritional Lab Results  Component Value Date   VD25OH 39.95 11/09/2022   VD25OH 59.5 07/25/2021   VD25OH 61.59 01/03/2021     Assessment and Plan    Hypertension Hypertension remains uncontrolled with blood pressures in the 160s/80s. He is under cardiology and hypertensive clinic care. Valsartan dose increased to 320 mg, with daily hydrochlorothiazide and added spironolactone. Reports possible edema from amlodipine. Discussed potential hydralazine addition for further control, noting possible fatigue. - Continue valsartan, hydrochlorothiazide, and spironolactone - Monitor blood pressure regularly - Consider hydralazine if further control is needed -Start Zepbound to help with obesity and obesity related comorbidities  Obesity Gained four pounds in two months, intermittently following category three plan. Exercises by walking 30 minutes twice weekly. Frequent dining out may contribute to weight gain. Discussed GLP-1 receptor agonists for weight management, specifically Zepbound, pending insurance approval due to cost. GLP-1s aid weight loss with proper nutrition monitoring. Explained Zepbound is a weekly injectable, refrigerated, with potential nausea if overeating. - Initiate prior authorization for Zepbound 2.5 mg weekly - Encourage adherence to category three plan - Increase walking frequency and duration  Obstructive Sleep Apnea Uses a dental device for sleep apnea, but partner reports apnea episodes. Discussed overnight oximetry to assess dental device effectiveness. Explored implantable device option or CPAP if current measures are insufficient. - Consider overnight oximetry to evaluate dental device effectiveness - Explore implantable  device options if needed -Start Zepbound 2.5 which has been shown to significantly improve OSA    CAD  followed by cardiology, at risk of MI CVA and CHF -Start Zepbound 2.5 mg to help treat his CAD and help prevent worsening CAD        He was informed of the importance of frequent follow up visits to maximize his success with intensive lifestyle modifications for his multiple health conditions.    Quillian Quince, MD

## 2023-03-23 ENCOUNTER — Other Ambulatory Visit: Payer: Self-pay

## 2023-03-23 ENCOUNTER — Other Ambulatory Visit (HOSPITAL_COMMUNITY): Payer: Self-pay

## 2023-03-23 ENCOUNTER — Other Ambulatory Visit: Payer: Self-pay | Admitting: Family Medicine

## 2023-03-23 MED ORDER — TADALAFIL 10 MG PO TABS
10.0000 mg | ORAL_TABLET | ORAL | 3 refills | Status: AC | PRN
Start: 2023-03-23 — End: ?
  Filled 2023-03-23: qty 90, 180d supply, fill #0
  Filled 2023-09-25: qty 90, 180d supply, fill #1

## 2023-03-26 ENCOUNTER — Encounter (HOSPITAL_COMMUNITY): Payer: Self-pay

## 2023-03-26 ENCOUNTER — Other Ambulatory Visit (HOSPITAL_COMMUNITY): Payer: Self-pay

## 2023-03-26 ENCOUNTER — Telehealth (INDEPENDENT_AMBULATORY_CARE_PROVIDER_SITE_OTHER): Payer: Self-pay

## 2023-03-26 NOTE — Telephone Encounter (Signed)
 PA for Zepbound 2.5 has been submitted, awaiting PA questions.

## 2023-04-04 LAB — BASIC METABOLIC PANEL
BUN/Creatinine Ratio: 31 — ABNORMAL HIGH (ref 10–24)
BUN: 35 mg/dL — ABNORMAL HIGH (ref 8–27)
CO2: 19 mmol/L — ABNORMAL LOW (ref 20–29)
Calcium: 10 mg/dL (ref 8.6–10.2)
Chloride: 106 mmol/L (ref 96–106)
Creatinine, Ser: 1.13 mg/dL (ref 0.76–1.27)
Glucose: 97 mg/dL (ref 70–99)
Potassium: 4.9 mmol/L (ref 3.5–5.2)
Sodium: 139 mmol/L (ref 134–144)
eGFR: 71 mL/min/{1.73_m2} (ref >59)

## 2023-04-05 NOTE — Telephone Encounter (Signed)
 Call to [facility] Value Script plan.  [phone].  Spoke with Everardo Beals, submitted prior auth via phone call.  Awaiting response in 72 hours.

## 2023-04-09 NOTE — Telephone Encounter (Signed)
 Fax from [place].  Zepbound has been approved from 04/05/2023 until further notice.   Patient notified via my chart.

## 2023-04-12 ENCOUNTER — Ambulatory Visit (HOSPITAL_BASED_OUTPATIENT_CLINIC_OR_DEPARTMENT_OTHER): Payer: BLUE CROSS/BLUE SHIELD | Admitting: Family

## 2023-04-12 ENCOUNTER — Encounter (HOSPITAL_BASED_OUTPATIENT_CLINIC_OR_DEPARTMENT_OTHER): Payer: Self-pay | Admitting: Family

## 2023-04-12 ENCOUNTER — Other Ambulatory Visit (HOSPITAL_COMMUNITY): Payer: Self-pay

## 2023-04-12 VITALS — BP 146/80 | HR 66 | Resp 16 | Ht 68.0 in | Wt 314.0 lb

## 2023-04-12 DIAGNOSIS — I10 Essential (primary) hypertension: Secondary | ICD-10-CM

## 2023-04-12 DIAGNOSIS — I2510 Atherosclerotic heart disease of native coronary artery without angina pectoris: Secondary | ICD-10-CM

## 2023-04-12 DIAGNOSIS — E785 Hyperlipidemia, unspecified: Secondary | ICD-10-CM

## 2023-04-12 DIAGNOSIS — R252 Cramp and spasm: Secondary | ICD-10-CM | POA: Diagnosis not present

## 2023-04-12 MED ORDER — AMLODIPINE-VALSARTAN-HCTZ 10-320-25 MG PO TABS
1.0000 | ORAL_TABLET | Freq: Every day | ORAL | 1 refills | Status: DC
  Filled 2023-04-12: qty 90, 90d supply, fill #0

## 2023-04-12 MED ORDER — AMLODIPINE BESYLATE 10 MG PO TABS
10.0000 mg | ORAL_TABLET | Freq: Every day | ORAL | 3 refills | Status: AC
  Filled 2023-04-12: qty 90, 90d supply, fill #0
  Filled 2023-07-12: qty 90, 90d supply, fill #1
  Filled 2023-11-01: qty 90, 90d supply, fill #2
  Filled 2024-02-22: qty 90, 90d supply, fill #3

## 2023-04-12 MED ORDER — VALSARTAN-HYDROCHLOROTHIAZIDE 320-25 MG PO TABS
1.0000 | ORAL_TABLET | Freq: Every day | ORAL | 3 refills | Status: AC
  Filled 2023-04-12: qty 90, 90d supply, fill #0
  Filled 2023-07-30: qty 90, 90d supply, fill #1
  Filled 2023-11-01: qty 90, 90d supply, fill #2

## 2023-04-12 NOTE — Progress Notes (Signed)
 [facility] Assessment:    Date:  04/12/2023   ID:  Keith Champagne, Keith Brown, DOB November 02, 1955, MRN [id number]  PCP:  Ardith Dark, Keith Brown  Cardiologist:  None  Nephrologist:  Referring Keith Brown: Ardith Dark, Keith Brown   CC: Hypertension  History of Present Illness:    Keith Champagne, Keith Brown is a 68 y.o. male with a hx of obesity, hypertension, OSA, prediabetes, hyperlipidemia, right renal artery stenosis, nonobstructive coronary artery disease here to follow up in the [facility].   Prior coronary calcium score 04/2020 of 15 placing him in the 33rd percentile. Mild dilation of 41mm. Established with [facility] 11/30/22. Keith Champagne, Keith Brown was diagnosed with hypertension years ago, but it worsened over the last two years. No tobacco use and occasional social alcohol use. He was not routinely following low sodium diet but was walking with his wife for exercise. Taking hydrochlorothiazide very infrequently as previously prescribed for kidney stones.  Using oral device to treat OSA. At his initial visit Losartan transitioned to Valsartan. Catecholamines, metanephrines unremarkable.  Renal artery duplex 12/25/2018 for the right 1-59% renal artery stenosis.  Rosuvastatin was initiated 3 times per week due to coronary artery disease.  At visit 02/01/23 Valsartan was increased to 320mg  daily. Renin aldosterone level unremarkable. Since that time Spironolactone 25mg  daily has been added.   Presents today for follow up. Since last seen has had Zepbound approved by Dr. Francena Hanly office. Enjoys working on cars in his spare time. BP at home most recently 163/81. Nocturnal cramping in legs even hands and back. Does improve somewhat with pickle juice or mustard. Reports no shortness of breath nor dyspnea on exertion. Reports no chest pain, pressure, or tightness. No edema, orthopnea, PND. Reports no palpitations.    Previous antihypertensives:  Past Medical History:  Diagnosis  Date   Acute meniscal tear of left knee    Allergy    fire ants   Back pain    Complication of anesthesia    Coronary artery disease    Edema of both lower extremities    Fatigue    History of kidney stones    Hyperlipidemia    Hypertension    Joint pain    Osteoarthritis    PONV (postoperative nausea and vomiting)    Sleep apnea    SOB (shortness of breath) on exertion    Vitamin D deficiency     Past Surgical History:  Procedure Laterality Date   BACK SURGERY     laminectomy L5-S1   COLONOSCOPY WITH PROPOFOL N/A 03/14/2017   Procedure: COLONOSCOPY WITH PROPOFOL;  Surgeon: Vida Rigger, Keith Brown;  Location: [facility];  Service: Endoscopy;  Laterality: N/A;   Colonscopy     polyps   CYSTOSCOPY KIDNEY W/ URETERAL GUIDE WIRE  2010   lithotripsy   CYSTOSCOPY/RETROGRADE/URETEROSCOPY/STONE EXTRACTION WITH BASKET Right 02/16/2016   Procedure: CYSTOSCOPY/RETROGRADE/RIGHT FLEXIBLE URETEROSCOPY/STONE EXTRACTION WITH BASKET;  Surgeon: Heloise Purpura, Keith Brown;  Location: [facility];  Service: Urology;  Laterality: Right;   HOLMIUM LASER APPLICATION Left 02/16/2016   Procedure: HOLMIUM LASER APPLICATION;  Surgeon: Heloise Purpura, Keith Brown;  Location: [facility];  Service: Urology;  Laterality: Left;   KNEE ARTHROSCOPY Right 2013   KNEE ARTHROSCOPY WITH MEDIAL MENISECTOMY Left 03/12/2015   Procedure: LEFT KNEE ARTHROSCOPY WITH PARTIAL MEDIAL MENISCECTOMY;  Surgeon: Kathryne Hitch, Keith Brown;  Location: [facility];  Service: Orthopedics;  Laterality: Left;   LAMINECTOMY  1999   MR HIPS BILATERAL     2009&2010  TOTAL HIP REVISION  09/15/2011   Procedure: TOTAL HIP REVISION;  Surgeon: Kathryne Hitch, Keith Brown;  Location: [facility];  Service: Orthopedics;  Laterality: Right;  Revision right hip acetabular component    Current Medications: Current Meds  Medication Sig   allopurinol (ZYLOPRIM) 300 MG tablet Take 1 tablet (300 mg total) by mouth daily.   amLODipine (NORVASC) 5 MG tablet Take 1 tablet (5 mg total) by mouth  daily. (Patient taking differently: Take 5 mg by mouth 2 (two) times daily.)   Cholecalciferol (VITAMIN D3) 50 MCG (2000 UT) TABS Take by mouth daily.   hydrochlorothiazide (HYDRODIURIL) 25 MG tablet Take 1 tablet (25 mg total) by mouth daily.   ketoconazole 2%-triamcinolone 0.1% 1:2 cream mixture Apply topically daily as needed   Loratadine 10 MG CAPS Take 10 mg by mouth at bedtime.    metFORMIN (GLUCOPHAGE-XR) 500 MG 24 hr tablet Take 1 tablet (500 mg total) by mouth daily with breakfast.   rosuvastatin (CRESTOR) 10 MG tablet Take 1 tablet (10 mg total) by mouth 3 (three) times a week.   spironolactone (ALDACTONE) 25 MG tablet Take 1 tablet (25 mg total) by mouth daily.   tadalafil (CIALIS) 10 MG tablet Take 1 tablet (10 mg total) by mouth every other day as needed for erectile dysfunction.   tirzepatide (ZEPBOUND) 2.5 MG/0.5ML Pen Inject 2.5 mg into the skin once a week.   valsartan (DIOVAN) 320 MG tablet Take 1 tablet (320 mg total) by mouth daily.     Allergies:   Patient has no known allergies.   Social History   Socioeconomic History   Marital status: Married    Spouse name: Not on file   Number of children: 1   Years of education: Not on file   Highest education level: Not on file  Occupational History   Not on file  Tobacco Use   Smoking status: Never   Smokeless tobacco: Never  Vaping Use   Vaping status: Never Used  Substance and Sexual Activity   Alcohol use: Yes    Alcohol/week: 7.0 standard drinks of alcohol    Types: 7 Glasses of wine per week    Comment: glass of wine per night   Drug use: No   Sexual activity: Not on file  Other Topics Concern   Not on file  Social History Narrative   Not on file   Social Drivers of Health   Financial Resource Strain: Low Risk  (12/01/2022)   Overall Financial Resource Strain (CARDIA)    Difficulty of Paying Living Expenses: Not hard at all  Food Insecurity: No Food Insecurity (12/01/2022)   Hunger Vital Sign     Worried About Running Out of Food in the Last Year: Never true    Ran Out of Food in the Last Year: Never true  Transportation Needs: No Transportation Needs (12/01/2022)   PRAPARE - Administrator, Civil Service (Medical): No    Lack of Transportation (Non-Medical): No  Physical Activity: Sufficiently Active (12/01/2022)   Exercise Vital Sign    Days of Exercise per Week: 7 days    Minutes of Exercise per Session: 30 min  Stress: No Stress Concern Present (12/01/2022)   [employer] of Occupational Health - Occupational Stress Questionnaire    Feeling of Stress : Not at all  Social Connections: Socially Integrated (12/01/2022)   Social Connection and Isolation Panel [NHANES]    Frequency of Communication with Friends and Family: More than three times a  week    Frequency of Social Gatherings with Friends and Family: More than three times a week    Attends Religious Services: More than 4 times per year    Active Member of [employer] or Organizations: Yes    Attends Engineer, structural: More than 4 times per year    Marital Status: Married     Family History: The patient's family history includes Anemia in his father; Cancer in his father; Colon cancer in his father; Diabetes in his father and mother; Heart attack in his sister; Heart disease in his mother; Heart failure in his brother; Hypertension in his father and mother; Kidney failure in his mother; Obesity in his brother, brother, brother, brother, father, mother, and sister; Prostatitis in his brother; Sleep apnea in his brother; Stroke in his mother. There is no history of Prostate cancer.  ROS:   Please see the history of present illness.    All other systems reviewed and are negative.  EKGs/Labs/Other Studies Reviewed:         Recent Labs: 11/09/2022: Hemoglobin 15.1; Platelets 143.0; TSH 2.26 01/24/2023: ALT 21 04/03/2023: BUN 35; Creatinine, Ser 1.13; Potassium 4.9; Sodium 139   Recent Lipid Panel     Component Value Date/Time   CHOL 161 01/24/2023 1436   TRIG 113 01/24/2023 1436   HDL 52 01/24/2023 1436   CHOLHDL 3.1 01/24/2023 1436   CHOLHDL 3 11/09/2022 0914   VLDL 11.6 11/09/2022 0914   LDLCALC 89 01/24/2023 1436   LDLCALC 104 (H) 06/05/2018 1352    Physical Exam:   VS:  BP (!) 146/80   Pulse 66   Resp 16   Ht 5\' 8"  (1.727 m)   Wt (!) 314 lb (142.4 kg)   SpO2 96%   BMI 47.74 kg/m  , BMI Body mass index is 47.74 kg/m. GENERAL:  Well appearing HEENT: Pupils equal round and reactive, fundi not visualized, oral mucosa unremarkable NECK:  No jugular venous distention, waveform within normal limits, carotid upstroke brisk and symmetric, no bruits, no thyromegaly LYMPHATICS:  No cervical adenopathy LUNGS:  Clear to auscultation bilaterally HEART:  RRR.  PMI not displaced or sustained,S1 and S2 within normal limits, no S3, no S4, no clicks, no rubs, no murmurs ABD:  Flat, positive bowel sounds normal in frequency in pitch, no bruits, no rebound, no guarding, no midline pulsatile mass, no hepatomegaly, no splenomegaly EXT:  2 plus pulses throughout, no edema, no cyanosis no clubbing SKIN:  No rashes no nodules NEURO:  Cranial nerves II through XII grossly intact, motor grossly intact throughout PSYCH:  Cognitively intact, oriented to person place and time   ASSESSMENT/PLAN:    HTN - BP not at goal <130/80.  Consolidate Amlodipine, Valsartan, hydrochlorothiazide to combination Amlodipine-Valsartan-hydrochlorothiazide 10-320-25mg  daily. Continue Spironolactone 25mg  daily. BMET today and reassess Spironolactone based on goal. As BP not at goal, based on labs consider increased Spironolactone vs Hydralazine. . Discussed to monitor BP at home at least 2 hours after medications and sitting for 5-10 minutes.  Right renal artery stenosis -renal duplex 11/2022 with right 1-59% stenosis.  Continue rosuvastatin to prevent progression.  Consider repeat imaging in 1 year for  monitoring.  Nonobstructive CAD / HLD, LDL goal <70 - 04/2020 calcium score of 15 with mild dilation ascending aorta 41mm. 11/09/22 LDL 101.  01/2023 LDL 89.  Goal LDL < 70.  Prefers to continue working on lifestyle changes and repeat lipids in 6 months to reassess.  Recommend aiming for 150 minutes  of moderate intensity activity per week and following a heart healthy diet.    OSA - Treated with dental device. Now on Zepbound.   Leg cramps - Labs today BMET, magnesium, phosphorus.  Screening for Secondary Hypertension:     11/30/2022    4:00 PM  Causes  Renovascular HTN Screened  Sleep Apnea Screened     - Comments treated  Thyroid Disease Screened  Hyperaldosteronism Screened  Pheochromocytoma Screened  Cushing's Syndrome N/A  Coarctation of the Aorta N/A  Compliance Screened    Relevant Labs/Studies:    Latest Ref Rng & Units 04/03/2023    3:01 PM 02/15/2023   10:23 AM 01/24/2023    2:36 PM  Basic Labs  Sodium 134 - 144 mmol/L 139  140  141   Potassium 3.5 - 5.2 mmol/L 4.9  4.7  4.6   Creatinine 0.76 - 1.27 mg/dL 8.11  9.14  7.82        Latest Ref Rng & Units 11/09/2022    9:14 AM 01/03/2021   10:22 AM  Thyroid   TSH 0.35 - 5.50 uIU/mL 2.26  2.63        Latest Ref Rng & Units 02/15/2023   10:23 AM  Renin/Aldosterone   Aldosterone 0.0 - 30.0 ng/dL 4.7   Aldos/Renin Ratio 0.0 - 30.0 0.8        Latest Ref Rng & Units 01/24/2023    2:36 PM 12/21/2022    8:20 AM  Metanephrines/Catecholamines   Epinephrine 0 - 62 pg/mL <15  <15   Norepinephrine 0 - 874 pg/mL 1,217  662   Dopamine 0 - 48 pg/mL 35  <30   Metanephrines 0.0 - 88.0 pg/mL <25.0  <25.0   Normetanephrines  0.0 - 285.2 pg/mL 114.6  78.5           12/26/2022   11:18 AM  Renovascular   Renal Artery [place] Completed Yes      Disposition:    FU with Keith Brown/PharmD/APP in 3 months    Medication Adjustments/Labs and Tests Ordered: Current medicines are reviewed at length with the patient today.  Concerns regarding  medicines are outlined above.  No orders of the defined types were placed in this encounter.  No orders of the defined types were placed in this encounter.    Signed, Alver Sorrow, NP  04/12/2023 2:32 PM    [facility]

## 2023-04-12 NOTE — Patient Instructions (Addendum)
 Medication Instructions:   STOP Amlodipine, Valsartan, Hydrochlorothiazide  START Amlodipine-Valsartan-Hydrochlorothiazide 10-320-25  We will consider adjusting Spironolactone based on your lab results   Labwork: Your physician recommends that you return for lab work today: BMET, magnesium, phosphorus    Follow-Up: In [facility] in 3 months with Dr. Duke Salvia or Gillian Shields, NP

## 2023-04-13 ENCOUNTER — Encounter (HOSPITAL_BASED_OUTPATIENT_CLINIC_OR_DEPARTMENT_OTHER): Payer: Self-pay

## 2023-04-13 ENCOUNTER — Other Ambulatory Visit (HOSPITAL_COMMUNITY): Payer: Self-pay

## 2023-04-13 DIAGNOSIS — R252 Cramp and spasm: Secondary | ICD-10-CM

## 2023-04-13 DIAGNOSIS — I10 Essential (primary) hypertension: Secondary | ICD-10-CM

## 2023-04-13 LAB — BASIC METABOLIC PANEL WITH GFR
BUN/Creatinine Ratio: 27 — ABNORMAL HIGH (ref 10–24)
BUN: 37 mg/dL — ABNORMAL HIGH (ref 8–27)
CO2: 20 mmol/L (ref 20–29)
Calcium: 10 mg/dL (ref 8.6–10.2)
Chloride: 103 mmol/L (ref 96–106)
Creatinine, Ser: 1.38 mg/dL — ABNORMAL HIGH (ref 0.76–1.27)
Glucose: 90 mg/dL (ref 70–99)
Potassium: 5 mmol/L (ref 3.5–5.2)
Sodium: 139 mmol/L (ref 134–144)
eGFR: 56 mL/min/{1.73_m2} — ABNORMAL LOW (ref >59)

## 2023-04-13 LAB — MAGNESIUM: Magnesium: 1.9 mg/dL (ref 1.6–2.3)

## 2023-04-13 LAB — PHOSPHORUS: Phosphorus: 3.8 mg/dL (ref 2.8–4.1)

## 2023-04-13 MED ORDER — HYDRALAZINE HCL 25 MG PO TABS
25.0000 mg | ORAL_TABLET | Freq: Two times a day (BID) | ORAL | 3 refills | Status: DC
  Filled 2023-04-13: qty 60, 30d supply, fill #0
  Filled 2023-05-14: qty 60, 30d supply, fill #1
  Filled 2023-06-19: qty 60, 30d supply, fill #2

## 2023-04-13 NOTE — Telephone Encounter (Signed)
 Results called to patient who verbalizes understanding! Rx to pharmacy and labs ordered.

## 2023-04-16 ENCOUNTER — Other Ambulatory Visit (HOSPITAL_COMMUNITY): Payer: Self-pay

## 2023-04-25 ENCOUNTER — Ambulatory Visit (INDEPENDENT_AMBULATORY_CARE_PROVIDER_SITE_OTHER): Admitting: Family Medicine

## 2023-04-25 ENCOUNTER — Encounter (INDEPENDENT_AMBULATORY_CARE_PROVIDER_SITE_OTHER): Payer: Self-pay | Admitting: Family Medicine

## 2023-04-25 ENCOUNTER — Other Ambulatory Visit (HOSPITAL_COMMUNITY): Payer: Self-pay

## 2023-04-25 VITALS — BP 130/89 | HR 71 | Temp 98.3°F | Ht 68.0 in | Wt 312.0 lb

## 2023-04-25 DIAGNOSIS — E669 Obesity, unspecified: Secondary | ICD-10-CM | POA: Diagnosis not present

## 2023-04-25 DIAGNOSIS — I10 Essential (primary) hypertension: Secondary | ICD-10-CM

## 2023-04-25 DIAGNOSIS — I1A0 Resistant hypertension: Secondary | ICD-10-CM | POA: Insufficient documentation

## 2023-04-25 DIAGNOSIS — R7303 Prediabetes: Secondary | ICD-10-CM

## 2023-04-25 DIAGNOSIS — R632 Polyphagia: Secondary | ICD-10-CM | POA: Insufficient documentation

## 2023-04-25 DIAGNOSIS — Z6841 Body Mass Index (BMI) 40.0 and over, adult: Secondary | ICD-10-CM

## 2023-04-25 MED ORDER — METFORMIN HCL 500 MG PO TABS
500.0000 mg | ORAL_TABLET | Freq: Every day | ORAL | 0 refills | Status: DC
  Filled 2023-04-25: qty 30, 30d supply, fill #0

## 2023-04-25 NOTE — Progress Notes (Signed)
 Office: [phone]  /  Fax: [phone]  WEIGHT SUMMARY AND BIOMETRICS  Anthropometric Measurements Height: 5\' 8"  (1.727 m) Weight: (!) 312 lb (141.5 kg) BMI (Calculated): 47.45 Weight at Last Visit: 312 lb Weight Lost Since Last Visit: 0 Weight Gained Since Last Visit: 0 Starting Weight: 317 lb Total Weight Loss (lbs): 5 lb (2.268 kg)   Body Composition  Body Fat %: 43.6 % Fat Mass (lbs): 136.4 lbs Muscle Mass (lbs): 167.8 lbs Total Body Water (lbs): 143.2 lbs Visceral Fat Rating : 33   Other Clinical Data Fasting: no Labs: no Today's Visit #: 27 Starting Date: 02/02/21    Chief Complaint: OBESITY     History of Present Illness Kerrin Champagne, MD "Keith Brown is a 68 year old male with obesity, prediabetes, and hypertension who presents to discuss his obesity diagnosis and assess his progress. He is accompanied by Vernona Rieger, his partner.  He has been adhering to his category three weight management plan approximately 25% of the time and engages in physical activity by walking 45 minutes three times per week. Despite these efforts, his weight has remained unchanged over the last month.  He is currently taking metformin XR for prediabetes but experiences lightheadedness about an hour after taking it, which he did not experience with the regular metformin. He sometimes needs to consume something with sugar to alleviate this feeling and is considering switching back to the regular metformin due to these side effects.  He manages hypertension with hydralazine, taken twice daily, and monitors his blood pressure before and after taking the medication to ensure it does not cause a significant drop. His heart rate, which usually runs low, increases to the 70s with hydralazine. He is also taking spironolactone and hydrochlorothiazide, but his kidney function has dropped slightly, prompting him to increase his fluid intake.  He struggles with maintaining adequate hydration, often  getting distracted by tasks and not drinking enough water. He estimates his fluid intake to be around four to five cups of liquid per day, including some caffeinated beverages.      PHYSICAL EXAM:  Blood pressure 130/89, pulse 71, temperature 98.3 F (36.8 C), height 5\' 8"  (1.727 m), weight (!) 312 lb (141.5 kg), SpO2 98%. Body mass index is 47.44 kg/m.  DIAGNOSTIC DATA REVIEWED:  BMET    Component Value Date/Time   NA 139 04/12/2023 1447   K 5.0 04/12/2023 1447   CL 103 04/12/2023 1447   CO2 20 04/12/2023 1447   GLUCOSE 90 04/12/2023 1447   GLUCOSE 105 (H) 11/09/2022 0914   BUN 37 (H) 04/12/2023 1447   CREATININE 1.38 (H) 04/12/2023 1447   CREATININE 0.99 06/05/2018 1352   CALCIUM 10.0 04/12/2023 1447   GFRNONAA 43 (L) 02/16/2016 1839   GFRAA 49 (L) 02/16/2016 1839   Lab Results  Component Value Date   HGBA1C 5.4 11/09/2022   HGBA1C 5.6 01/23/2017   Lab Results  Component Value Date   INSULIN 19.4 07/25/2021   INSULIN 20.6 02/02/2021   Lab Results  Component Value Date   TSH 2.26 11/09/2022   CBC    Component Value Date/Time   WBC 6.8 11/09/2022 0914   RBC 5.18 11/09/2022 0914   HGB 15.1 11/09/2022 0914   HGB 15.7 07/25/2021 0742   HCT 46.9 11/09/2022 0914   HCT 46.8 07/25/2021 0742   PLT 143.0 (L) 11/09/2022 0914   PLT 156 07/25/2021 0742   MCV 90.5 11/09/2022 0914   MCV 89 07/25/2021 0742   MCH 29.9  07/25/2021 0742   MCH 29.4 03/31/2020 1510   MCHC 32.1 11/09/2022 0914   RDW 14.4 11/09/2022 0914   RDW 13.1 07/25/2021 0742   Iron Studies    Component Value Date/Time   IRON 108 03/31/2020 1510   TIBC 307 03/31/2020 1510   FERRITIN 451 (H) 03/31/2020 1510   IRONPCTSAT 35 03/31/2020 1510   Lipid Panel     Component Value Date/Time   CHOL 161 01/24/2023 1436   TRIG 113 01/24/2023 1436   HDL 52 01/24/2023 1436   CHOLHDL 3.1 01/24/2023 1436   CHOLHDL 3 11/09/2022 0914   VLDL 11.6 11/09/2022 0914   LDLCALC 89 01/24/2023 1436   LDLCALC 104 (H)  06/05/2018 1352   Hepatic Function Panel     Component Value Date/Time   PROT 6.9 01/24/2023 1436   ALBUMIN 4.5 01/24/2023 1436   AST 22 01/24/2023 1436   ALT 21 01/24/2023 1436   ALKPHOS 77 01/24/2023 1436   BILITOT 0.7 01/24/2023 1436   BILIDIR 0.22 01/24/2023 1436      Component Value Date/Time   TSH 2.26 11/09/2022 0914   Nutritional Lab Results  Component Value Date   VD25OH 39.95 11/09/2022   VD25OH 59.5 07/25/2021   VD25OH 61.59 01/03/2021     Assessment and Plan Assessment & Plan Obesity He is diagnosed with obesity and adheres to his category three plan approximately 25% of the time. He engages in physical activity by walking 45 minutes three times per week, but his weight remains unchanged over the last month. Zepbound has been approved and is ready to be initiated. It is safe to administer Zepbound with metformin, and starting at a low dose reduces the likelihood of nausea. Zepbound is expected to aid in appetite management by slowing gastrointestinal transit, leading to early and prolonged satiety. - Initiate Zepbound on Monday - Continue walking 45 minutes three times per week - Encourage adherence to the category three plan  Polyphagia He experiences increased appetite, likely related to obesity and prediabetes. Zepbound is expected to aid in appetite management by slowing gastrointestinal transit, leading to early and prolonged satiety. Adequate roughage intake is important to support gastrointestinal function while on Zepbound. - Initiate Zepbound to aid in appetite management - Ensure adequate roughage intake  Prediabetes He manages prediabetes with metformin. He switched from regular metformin to the extended-release (XR) version, which he suspects causes lightheadedness. The XR version may have an immediate release component causing this effect. He will revert to the regular version to alleviate these symptoms. - Revert to regular metformin from the XR  version  Hypertension He is on a regimen including hydralazine, spironolactone, and hydrochlorothiazide for hypertension management. He regularly monitors blood pressure and reports hydralazine is effective. A previous increase in BUN and creatinine levels, likely due to spironolactone, emphasizes the importance of hydration to support renal function. He is encouraged to consume calorie-free, caffeine-free liquids to ensure adequate hydration. - Continue current hypertension medication regimen - Ensure adequate hydration - Recheck electrolytes as per cardiology nurse practitioner's recommendation    He was informed of the importance of frequent follow up visits to maximize his success with intensive lifestyle modifications for his multiple health conditions.    Quillian Quince, MD

## 2023-05-15 ENCOUNTER — Other Ambulatory Visit (HOSPITAL_COMMUNITY): Payer: Self-pay

## 2023-05-25 ENCOUNTER — Other Ambulatory Visit (HOSPITAL_COMMUNITY): Payer: Self-pay

## 2023-05-25 ENCOUNTER — Other Ambulatory Visit (INDEPENDENT_AMBULATORY_CARE_PROVIDER_SITE_OTHER): Payer: Self-pay | Admitting: Family Medicine

## 2023-05-25 DIAGNOSIS — G4733 Obstructive sleep apnea (adult) (pediatric): Secondary | ICD-10-CM

## 2023-05-28 ENCOUNTER — Other Ambulatory Visit (HOSPITAL_COMMUNITY): Payer: Self-pay

## 2023-05-28 ENCOUNTER — Encounter (HOSPITAL_COMMUNITY): Payer: Self-pay

## 2023-05-30 ENCOUNTER — Telehealth (INDEPENDENT_AMBULATORY_CARE_PROVIDER_SITE_OTHER): Payer: Self-pay | Admitting: Family Medicine

## 2023-05-30 ENCOUNTER — Other Ambulatory Visit (HOSPITAL_COMMUNITY): Payer: Self-pay

## 2023-05-30 ENCOUNTER — Other Ambulatory Visit (INDEPENDENT_AMBULATORY_CARE_PROVIDER_SITE_OTHER): Payer: Self-pay

## 2023-05-30 ENCOUNTER — Encounter (INDEPENDENT_AMBULATORY_CARE_PROVIDER_SITE_OTHER): Payer: Self-pay

## 2023-05-30 DIAGNOSIS — G4733 Obstructive sleep apnea (adult) (pediatric): Secondary | ICD-10-CM

## 2023-05-30 MED ORDER — METFORMIN HCL 500 MG PO TABS
500.0000 mg | ORAL_TABLET | Freq: Every day | ORAL | 0 refills | Status: DC
  Filled 2023-05-30: qty 30, 30d supply, fill #0

## 2023-05-30 MED ORDER — TIRZEPATIDE-WEIGHT MANAGEMENT 2.5 MG/0.5ML ~~LOC~~ SOAJ
2.5000 mg | SUBCUTANEOUS | 0 refills | Status: DC
  Filled 2023-05-30: qty 2, 28d supply, fill #0

## 2023-05-30 NOTE — Telephone Encounter (Signed)
 LAST APPOINTMENT DATE: 04/25/2023 NEXT APPOINTMENT DATE: 06/05/2023   [place] [facility] [address] [place] [address] Phone: [phone] Fax: [phone]  [facility] [address] [place] [address] Phone: [phone] Fax: [phone]  [facility] [address] [place] - [address] [place] [address] Phone: [phone] Fax: [phone]  [id number] - [place], [place] - [address] AT [facility] [address] [place] [address] Phone: [phone] Fax: [phone]  Patient is requesting a refill of the following medications: No prescriptions requested or ordered in this encounter   Date last filled:  Metformin  04/25/2023, Zepbound  03/22/2023 Previously prescribed by Dr. Alvia Awkward  Lab Results      Component                Value               Date                      HGBA1C                   5.4                 11/09/2022                HGBA1C                   5.4                 07/25/2021                HGBA1C                   5.5                 01/03/2021           Lab Results      Component                Value               Date                      LDLCALC                  89                  01/24/2023                CREATININE               1.38 (H)            04/12/2023           Lab Results      Component                Value               Date                      VD25OH                   39.95               11/09/2022  VD25OH                   59.5                07/25/2021                VD25OH                   61.59               01/03/2021            BP Readings from Last 3 Encounters: 04/25/23 : 130/89 04/12/23 : (!) 146/80 03/22/23 : (!) 162/85

## 2023-05-30 NOTE — Telephone Encounter (Signed)
 5/14 pt wants a call to find out why his metformin  and zepbound  was denied

## 2023-05-30 NOTE — Telephone Encounter (Signed)
 Denied by us  or by his insurance?

## 2023-05-30 NOTE — Telephone Encounter (Signed)
Please refill both meds 

## 2023-06-05 ENCOUNTER — Other Ambulatory Visit (HOSPITAL_COMMUNITY): Payer: Self-pay

## 2023-06-05 ENCOUNTER — Ambulatory Visit (INDEPENDENT_AMBULATORY_CARE_PROVIDER_SITE_OTHER): Admitting: Family Medicine

## 2023-06-05 ENCOUNTER — Encounter (INDEPENDENT_AMBULATORY_CARE_PROVIDER_SITE_OTHER): Payer: Self-pay | Admitting: Family Medicine

## 2023-06-05 VITALS — BP 138/72 | HR 66 | Temp 98.2°F | Ht 68.0 in | Wt 310.0 lb

## 2023-06-05 DIAGNOSIS — E669 Obesity, unspecified: Secondary | ICD-10-CM | POA: Diagnosis not present

## 2023-06-05 DIAGNOSIS — E66813 Obesity, class 3: Secondary | ICD-10-CM

## 2023-06-05 DIAGNOSIS — I10 Essential (primary) hypertension: Secondary | ICD-10-CM

## 2023-06-05 DIAGNOSIS — R632 Polyphagia: Secondary | ICD-10-CM | POA: Diagnosis not present

## 2023-06-05 DIAGNOSIS — G4733 Obstructive sleep apnea (adult) (pediatric): Secondary | ICD-10-CM

## 2023-06-05 DIAGNOSIS — Z6841 Body Mass Index (BMI) 40.0 and over, adult: Secondary | ICD-10-CM

## 2023-06-05 MED ORDER — METFORMIN HCL 500 MG PO TABS
500.0000 mg | ORAL_TABLET | Freq: Every day | ORAL | 1 refills | Status: DC
  Filled 2023-06-05 – 2023-06-29 (×2): qty 90, 90d supply, fill #0

## 2023-06-05 MED ORDER — TIRZEPATIDE-WEIGHT MANAGEMENT 5 MG/0.5ML ~~LOC~~ SOAJ
5.0000 mg | SUBCUTANEOUS | 2 refills | Status: DC
  Filled 2023-06-05: qty 2, 28d supply, fill #0
  Filled 2023-07-12: qty 2, 28d supply, fill #1

## 2023-06-05 NOTE — Progress Notes (Signed)
 Office: [phone]  /  Fax: [phone]  WEIGHT SUMMARY AND BIOMETRICS  Anthropometric Measurements Height: 5\' 8"  (1.727 m) Weight: (!) 310 lb (140.6 kg) BMI (Calculated): 47.15 Weight at Last Visit: 312 lb Weight Lost Since Last Visit: 2 lb Weight Gained Since Last Visit: 0 Starting Weight: 317 lb Total Weight Loss (lbs): 7 lb (3.175 kg) Peak Weight: 320 lb   Body Composition  Body Fat %: 43.4 % Fat Mass (lbs): 134.6 lbs Muscle Mass (lbs): 167 lbs Total Body Water (lbs): 144 lbs Visceral Fat Rating : 32   Other Clinical Data Fasting: no Labs: no Today's Visit #: 28 Starting Date: 02/02/21    Chief Complaint: OBESITY     History of Present Illness Alphonso Jean, 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  He is following the category three eating plan about 35% of the time and has been active outdoors, engaging in yard work and golfing, aiming for 30 minutes three times a week. He has lost two pounds in the last month since his last visit.  He is currently on Zepbound  to help manage his polyphagia. He experienced an abdominal spasm after the first injection, which was epigastric and lasted about 15 minutes, resolving when he rolled over. He noted a definite decrease in appetite and mentioned that the effect is strongest in the first three to four days after administration. He also mentioned that he does not feel the need to 'clean his plate' at meals.  He is also on metformin  for prediabetes. His blood pressure is well-controlled with his current regimen, which includes diuretics. He noted increased urination, which he attributes to the diuretics.  He discussed his family history, noting a genetic tendency towards obesity, and mentioned that he comes from a family of five boys who were all athletes. He also noted that his parents had access to [employer] in [employer], which facilitated  feeding the family.      PHYSICAL EXAM:  Blood pressure 138/72, pulse 66, temperature 98.2 F (36.8 C), height 5\' 8"  (1.727 m), weight (!) 310 lb (140.6 kg), SpO2 97%. Body mass index is 47.14 kg/m.  DIAGNOSTIC DATA REVIEWED:  BMET    Component Value Date/Time   NA 139 04/12/2023 1447   K 5.0 04/12/2023 1447   CL 103 04/12/2023 1447   CO2 20 04/12/2023 1447   GLUCOSE 90 04/12/2023 1447   GLUCOSE 105 (H) 11/09/2022 0914   BUN 37 (H) 04/12/2023 1447   CREATININE 1.38 (H) 04/12/2023 1447   CREATININE 0.99 06/05/2018 1352   CALCIUM  10.0 04/12/2023 1447   GFRNONAA 43 (L) 02/16/2016 1839   GFRAA 49 (L) 02/16/2016 1839   Lab Results  Component Value Date   HGBA1C 5.4 11/09/2022   HGBA1C 5.6 01/23/2017   Lab Results  Component Value Date   INSULIN  19.4 07/25/2021   INSULIN  20.6 02/02/2021   Lab Results  Component Value Date   TSH 2.26 11/09/2022   CBC    Component Value Date/Time   WBC 6.8 11/09/2022 0914   RBC 5.18 11/09/2022 0914   HGB 15.1 11/09/2022 0914   HGB 15.7 07/25/2021 0742   HCT 46.9 11/09/2022 0914   HCT 46.8 07/25/2021 0742   PLT 143.0 (L) 11/09/2022 0914   PLT 156 07/25/2021 0742   MCV 90.5 11/09/2022 0914   MCV 89 07/25/2021 0742   MCH 29.9 07/25/2021 0742   MCH 29.4 03/31/2020 1510  MCHC 32.1 11/09/2022 0914   RDW 14.4 11/09/2022 0914   RDW 13.1 07/25/2021 0742   Iron Studies    Component Value Date/Time   IRON 108 03/31/2020 1510   TIBC 307 03/31/2020 1510   FERRITIN 451 (H) 03/31/2020 1510   IRONPCTSAT 35 03/31/2020 1510   Lipid Panel     Component Value Date/Time   CHOL 161 01/24/2023 1436   TRIG 113 01/24/2023 1436   HDL 52 01/24/2023 1436   CHOLHDL 3.1 01/24/2023 1436   CHOLHDL 3 11/09/2022 0914   VLDL 11.6 11/09/2022 0914   LDLCALC 89 01/24/2023 1436   LDLCALC 104 (H) 06/05/2018 1352   Hepatic Function Panel     Component Value Date/Time   PROT 6.9 01/24/2023 1436   ALBUMIN  4.5 01/24/2023 1436   AST 22 01/24/2023  1436   ALT 21 01/24/2023 1436   ALKPHOS 77 01/24/2023 1436   BILITOT 0.7 01/24/2023 1436   BILIDIR 0.22 01/24/2023 1436      Component Value Date/Time   TSH 2.26 11/09/2022 0914   Nutritional Lab Results  Component Value Date   VD25OH 39.95 11/09/2022   VD25OH 59.5 07/25/2021   VD25OH 61.59 01/03/2021     Assessment and Plan Assessment & Plan Obesity with polyphagia Obesity management with partial adherence to dietary plan and increased physical activity. Weight loss of 2 pounds in the last month. Current treatment includes Zepbound , effective in appetite suppression. Initial abdominal spasm with first dose resolved spontaneously. Discussed increasing Zepbound  to 5 mg for enhanced efficacy. - Increase Zepbound  to 5 mg pen. - Provide 90-day supply of Zepbound . - Schedule follow-up in one month. -Continue to follow Cat 3 plan and exercise goal is 150 minutes per week.   OSA He is doing well on Zepbound  to help treat his OSA, ho noted some epigastric discomfort for about 15 minutes after his first dose but none since - increase Zepbound  to 7.5mg  daily and will follow up in 1 month -Continue to follow Cat 3 plan and exercise goal is 150 minutes per week.  Prediabetes Prediabetes managed with metformin . Improved dietary habits and increased physical activity reported. - Provide 90-day supply of metformin  with refills. -Continue to follow Cat 3 plan and exercise goal is 150 minutes per week.  Hypertension Hypertension well-controlled with current medication regimen. Blood pressure readings satisfactory. Diuretics affect urinary frequency. -Continue to follow Cat 3 plan and exercise goal is 150 minutes per week.  General Health Maintenance Advised to continue regular physical activity and healthy eating habits. Discussed importance of maintaining muscle mass and adequate protein intake to prevent malnutrition.  Follow-Up Next appointment scheduled for June 17th. Plan to  schedule subsequent appointment in late August with morning labs to monitor progress on current treatment regimen. - Schedule follow-up appointment in late August with morning labs.       He was informed of the importance of frequent follow up visits to maximize his success with intensive lifestyle modifications for his multiple health conditions.    Jasmine Mesi, MD

## 2023-06-26 ENCOUNTER — Encounter: Payer: Self-pay | Admitting: Dermatology

## 2023-06-26 ENCOUNTER — Ambulatory Visit: Admitting: Dermatology

## 2023-06-26 ENCOUNTER — Other Ambulatory Visit (HOSPITAL_COMMUNITY): Payer: Self-pay

## 2023-06-26 DIAGNOSIS — Z1283 Encounter for screening for malignant neoplasm of skin: Secondary | ICD-10-CM

## 2023-06-26 DIAGNOSIS — L578 Other skin changes due to chronic exposure to nonionizing radiation: Secondary | ICD-10-CM

## 2023-06-26 DIAGNOSIS — L281 Prurigo nodularis: Secondary | ICD-10-CM | POA: Diagnosis not present

## 2023-06-26 DIAGNOSIS — L570 Actinic keratosis: Secondary | ICD-10-CM

## 2023-06-26 DIAGNOSIS — D485 Neoplasm of uncertain behavior of skin: Secondary | ICD-10-CM

## 2023-06-26 DIAGNOSIS — L821 Other seborrheic keratosis: Secondary | ICD-10-CM

## 2023-06-26 DIAGNOSIS — D492 Neoplasm of unspecified behavior of bone, soft tissue, and skin: Secondary | ICD-10-CM | POA: Diagnosis not present

## 2023-06-26 DIAGNOSIS — I872 Venous insufficiency (chronic) (peripheral): Secondary | ICD-10-CM

## 2023-06-26 DIAGNOSIS — W908XXA Exposure to other nonionizing radiation, initial encounter: Secondary | ICD-10-CM | POA: Diagnosis not present

## 2023-06-26 DIAGNOSIS — B351 Tinea unguium: Secondary | ICD-10-CM

## 2023-06-26 DIAGNOSIS — D1801 Hemangioma of skin and subcutaneous tissue: Secondary | ICD-10-CM

## 2023-06-26 DIAGNOSIS — L814 Other melanin hyperpigmentation: Secondary | ICD-10-CM

## 2023-06-26 MED ORDER — TERBINAFINE HCL 250 MG PO TABS
250.0000 mg | ORAL_TABLET | Freq: Every day | ORAL | 0 refills | Status: DC
  Filled 2023-06-26: qty 90, 90d supply, fill #0

## 2023-06-26 MED ORDER — TRIAMCINOLONE ACETONIDE 0.1 % EX CREA
1.0000 | TOPICAL_CREAM | Freq: Two times a day (BID) | CUTANEOUS | 11 refills | Status: AC | PRN
  Filled 2023-06-26: qty 454, 90d supply, fill #0

## 2023-06-26 NOTE — Progress Notes (Signed)
 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  Declines personal or family history of skin cancer. Patient wears a hat when outside. He has no lesions that are concerning him. He is concerned about the dryness in his lower legs that is itchy. Retired [employer] banker, operational.  The patient presents for Total-Body Skin Exam (TBSE) for skin cancer screening and mole check. The patient has spots, moles and lesions to be evaluated, some may be new or changing.  The following portions of the chart were reviewed this encounter and updated as appropriate: medications, allergies, medical history  Review of Systems:  No other skin or systemic complaints except as noted in HPI or Assessment and Plan.  Objective  Well appearing patient in no apparent distress; mood and affect are within normal limits.  A full examination was performed including scalp, head, eyes, ears, nose, lips, neck, chest, axillae, abdomen, back, buttocks, bilateral upper extremities, bilateral lower extremities, hands, feet, fingers, toes, fingernails, and toenails. All findings within normal limits unless otherwise noted below.   Relevant physical exam findings are noted in the Assessment and Plan.  Right Anterior Mandible 1.3cm, pink scaly plaque  Assessment & Plan   SKIN CANCER SCREENING PERFORMED TODAY.  ACTINIC DAMAGE - Chronic condition, secondary to cumulative UV/sun exposure - diffuse scaly erythematous macules with underlying dyspigmentation - Recommend daily broad spectrum sunscreen SPF 30+ to sun-exposed areas, reapply every 2 hours as needed.  - Staying in the shade or wearing long sleeves, sun glasses (UVA+UVB protection) and wide brim hats (4-inch brim around the entire circumference of the hat) are also recommended for sun protection.  - Call for new or changing lesions.  LENTIGINES, SEBORRHEIC KERATOSES,  HEMANGIOMAS - Benign normal skin lesions - Benign-appearing - Call for any changes  ONYCHOMYCOSIS Exam: Thickened toenails with subungal debris c/w onychomycosis  Chronic and persistent condition with duration or expected duration over one year. Condition is symptomatic/ bothersome to patient. Not currently at goal.  Treatment Plan: Oral Terbinafine daily for 30 days  STASIS DERMATITIS Exam: Erythematous, scaly patches involving the ankle and distal lower leg with associated lower leg edema.  flared  Stasis in the legs causes chronic leg swelling, which may result in itchy or painful rashes, skin discoloration, skin texture changes, and sometimes ulceration.  Recommend daily graduated compression hose/stockings- easiest to put on first thing in morning, remove at bedtime.  Elevate legs as much as possible. Avoid salt/sodium rich foods.  Treatment Plan: Triamcinolone  Cream   NEOPLASM OF UNCERTAIN BEHAVIOR OF SKIN Right Anterior Mandible Skin / nail biopsy Type of biopsy: tangential   Informed consent: discussed and consent obtained   Procedure prep:  Patient was prepped and draped in usual sterile fashion Prep type:  Isopropyl alcohol Anesthesia: the lesion was anesthetized in a standard fashion   Anesthetic:  1% lidocaine  w/ epinephrine 1-100,000 buffered w/ 8.4% NaHCO3 Instrument used: DermaBlade   Hemostasis achieved with: aluminum chloride   Outcome: patient tolerated procedure well   Post-procedure details: sterile dressing applied and wound care instructions given   Dressing type: petrolatum and bandage   Specimen 1 - Surgical pathology Differential Diagnosis: r/o NMSC  Check Margins: No AK (ACTINIC KERATOSIS) Right Forehead Destruction of lesion - Right Forehead Complexity: simple   Destruction method: cryotherapy   Timeout:  patient name, date of birth, surgical site, and procedure verified Lesion destroyed using  liquid nitrogen: Yes   Region frozen until ice  ball extended beyond lesion: Yes   Cryotherapy cycles:  1 Outcome: patient tolerated procedure well with no complications   Post-procedure details: wound care instructions given    Meds ordered this encounter  Medications   triamcinolone  cream (KENALOG ) 0.1 %    Sig: Apply 1 Application topically 2 (two) times daily as needed. Apply topically after the shower daily.    Dispense:  453.6 g    Refill:  11   terbinafine (LAMISIL) 250 MG tablet    Sig: Take 1 tablet (250 mg total) by mouth daily.    Dispense:  90 tablet    Refill:  0    Return in about 3 months (around 09/26/2023) for Onychomycosis follow up.  Maurine Sovereign, RN, am acting as scribe for Deneise Finlay, MD .   Documentation: I have reviewed the above documentation for accuracy and completeness, and I agree with the above.  Deneise Finlay, MD

## 2023-06-26 NOTE — Patient Instructions (Addendum)
 Patient Handout: Wound Care for Skin Biopsy Site  Taking Care of Your Skin Biopsy Site  Proper care of the biopsy site is essential for promoting healing and minimizing scarring. This handout provides instructions on how to care for your biopsy site to ensure optimal recovery.  1. Cleaning the Wound:  Clean the biopsy site daily with gentle soap and water. Gently pat the area dry with a clean, soft towel. Avoid harsh scrubbing or rubbing the area, as this can irritate the skin and delay healing.  2. Applying Aquaphor and Bandage:  After cleaning the wound, apply a thin layer of Aquaphor ointment to the biopsy site. Cover the area with a sterile bandage to protect it from dirt, bacteria, and friction. Change the bandage daily or as needed if it becomes soiled or wet.  3. Continued Care for One Week:  Repeat the cleaning, Aquaphor application, and bandaging process daily for one week following the biopsy procedure. Keeping the wound clean and moist during this initial healing period will help prevent infection and promote optimal healing.  4. Massaging Aquaphor into the Area:  ---After one week, discontinue the use of bandages but continue to apply Aquaphor to the biopsy site. ----Gently massage the Aquaphor into the area using circular motions. ---Massaging the skin helps to promote circulation and prevent the formation of scar tissue.   Additional Tips:  Avoid exposing the biopsy site to direct sunlight during the healing process, as this can cause hyperpigmentation or worsen scarring. If you experience any signs of infection, such as increased redness, swelling, warmth, or drainage from the wound, contact your healthcare provider immediately. Follow any additional instructions provided by your healthcare provider for caring for the biopsy site and managing any discomfort. Conclusion:  Taking proper care of your skin biopsy site is crucial for ensuring optimal healing and  minimizing scarring. By following these instructions for cleaning, applying Aquaphor, and massaging the area, you can promote a smooth and successful recovery. If you have any questions or concerns about caring for your biopsy site, don't hesitate to contact your healthcare provider for guidance.    For areas treated with Liquid Nitrogen:  Keep clean with soap and water.  Apply Vaseline or Aquaphor twice daily.   Patient Handout: Wound Care for Skin Biopsy Site  Taking Care of Your Skin Biopsy Site  Proper care of the biopsy site is essential for promoting healing and minimizing scarring. This handout provides instructions on how to care for your biopsy site to ensure optimal recovery.  1. Cleaning the Wound:  Clean the biopsy site daily with gentle soap and water. Gently pat the area dry with a clean, soft towel. Avoid harsh scrubbing or rubbing the area, as this can irritate the skin and delay healing.  2. Applying Aquaphor and Bandage:  After cleaning the wound, apply a thin layer of Aquaphor ointment to the biopsy site. Cover the area with a sterile bandage to protect it from dirt, bacteria, and friction. Change the bandage daily or as needed if it becomes soiled or wet.  3. Continued Care for One Week:  Repeat the cleaning, Aquaphor application, and bandaging process daily for one week following the biopsy procedure. Keeping the wound clean and moist during this initial healing period will help prevent infection and promote optimal healing.  4. Massaging Aquaphor into the Area:  ---After one week, discontinue the use of bandages but continue to apply Aquaphor to the biopsy site. ----Gently massage the Aquaphor into the area using  circular motions. ---Massaging the skin helps to promote circulation and prevent the formation of scar tissue.   Additional Tips:  Avoid exposing the biopsy site to direct sunlight during the healing process, as this can cause hyperpigmentation or  worsen scarring. If you experience any signs of infection, such as increased redness, swelling, warmth, or drainage from the wound, contact your healthcare provider immediately. Follow any additional instructions provided by your healthcare provider for caring for the biopsy site and managing any discomfort. Conclusion:  Taking proper care of your skin biopsy site is crucial for ensuring optimal healing and minimizing scarring. By following these instructions for cleaning, applying Aquaphor, and massaging the area, you can promote a smooth and successful recovery. If you have any questions or concerns about caring for your biopsy site, don't hesitate to contact your healthcare provider for guidance.    For areas treated with Liquid Nitrogen:  Keep clean with soap and water.  Apply Vaseline or Aquaphor twice daily.  Important Information  Due to recent changes in healthcare laws, you may see results of your pathology and/or laboratory studies on MyChart before the doctors have had a chance to review them. We understand that in some cases there may be results that are confusing or concerning to you. Please understand that not all results are received at the same time and often the doctors may need to interpret multiple results in order to provide you with the best plan of care or course of treatment. Therefore, we ask that you please give us  2 business days to thoroughly review all your results before contacting the office for clarification. Should we see a critical lab result, you will be contacted sooner.   If You Need Anything After Your Visit  If you have any questions or concerns for your doctor, please call our main line at [phone] If no one answers, please leave a voicemail as directed and we will return your call as soon as possible. Messages left after 4 pm will be answered the following business day.   You may also send us  a message via MyChart. We typically respond to MyChart  messages within 1-2 business days.  For prescription refills, please ask your pharmacy to contact our office. Our fax number is [phone].  If you have an urgent issue when the clinic is closed that cannot wait until the next business day, you can page your doctor at the number below.    Please note that while we do our best to be available for urgent issues outside of office hours, we are not available 24/7.   If you have an urgent issue and are unable to reach us , you may choose to seek medical care at your doctor's office, retail clinic, urgent care center, or emergency room.  If you have a medical emergency, please immediately call 911 or go to the emergency department. In the event of inclement weather, please call our main line at [phone] for an update on the status of any delays or closures.  Dermatology Medication Tips: Please keep the boxes that topical medications come in in order to help keep track of the instructions about where and how to use these. Pharmacies typically print the medication instructions only on the boxes and not directly on the medication tubes.   If your medication is too expensive, please contact our office at [phone] or send us  a message through MyChart.   We are unable to tell what your co-pay for medications will be in advance  as this is different depending on your insurance coverage. However, we may be able to find a substitute medication at lower cost or fill out paperwork to get insurance to cover a needed medication.   If a prior authorization is required to get your medication covered by your insurance company, please allow us  1-2 business days to complete this process.  Drug prices often vary depending on where the prescription is filled and some pharmacies may offer cheaper prices.  The website www.goodrx.com contains coupons for medications through different pharmacies. The prices here do not account for what the cost may be with help  from insurance (it may be cheaper with your insurance), but the website can give you the price if you did not use any insurance.  - You can print the associated coupon and take it with your prescription to the pharmacy.  - You may also stop by our office during regular business hours and pick up a GoodRx coupon card.  - If you need your prescription sent electronically to a different pharmacy, notify our office through [facility] or by phone at [phone]

## 2023-06-28 ENCOUNTER — Ambulatory Visit (HOSPITAL_BASED_OUTPATIENT_CLINIC_OR_DEPARTMENT_OTHER): Admitting: Family

## 2023-06-28 ENCOUNTER — Other Ambulatory Visit (HOSPITAL_COMMUNITY): Payer: Self-pay

## 2023-06-28 VITALS — BP 134/86 | HR 60 | Ht 69.5 in | Wt 306.7 lb

## 2023-06-28 DIAGNOSIS — G4733 Obstructive sleep apnea (adult) (pediatric): Secondary | ICD-10-CM | POA: Diagnosis not present

## 2023-06-28 DIAGNOSIS — I2510 Atherosclerotic heart disease of native coronary artery without angina pectoris: Secondary | ICD-10-CM

## 2023-06-28 DIAGNOSIS — I10 Essential (primary) hypertension: Secondary | ICD-10-CM

## 2023-06-28 DIAGNOSIS — E785 Hyperlipidemia, unspecified: Secondary | ICD-10-CM | POA: Diagnosis not present

## 2023-06-28 LAB — SURGICAL PATHOLOGY

## 2023-06-28 MED ORDER — HYDRALAZINE HCL 25 MG PO TABS
25.0000 mg | ORAL_TABLET | Freq: Two times a day (BID) | ORAL | 3 refills | Status: AC | PRN
  Filled 2023-06-28: qty 60, 30d supply, fill #0

## 2023-06-28 NOTE — Progress Notes (Signed)
 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   Prior coronary calcium  score 04/2020 of 15 placing him in the 33rd percentile. Mild dilation of 41mm. Established with [facility] 11/30/22. Keith Jean, MD was diagnosed with hypertension years ago, but it worsened over the last two years. No tobacco use and occasional social alcohol use. He was not routinely following low sodium diet but was walking with his wife for exercise. Taking hydrochlorothiazide  very infrequently as previously prescribed for kidney stones.  Using oral device to treat OSA. At his initial visit Losartan  transitioned to Valsartan . Catecholamines, metanephrines unremarkable.  Renal artery duplex 12/25/2018 for the right 1-59% renal artery stenosis.  Rosuvastatin  was initiated 3 times per week due to coronary artery disease.  At visit 02/01/23 Valsartan  was increased to 320mg  daily. Renin aldosterone level unremarkable. Since that time Spironolactone  25mg  daily has been added.   At visit 04/12/23 Zepbound  recently approved by Dr. Lenward Railing office. Based on labs, spironolactone  unable to be further escalated and Hydralazine  25mg  BID initiated.   Presents today for follow up. Enjoys working on cars in his spare time. Has been active golfing recently. Notes note he skipped Hydralazine  yesterday as it significantly lowers his BP and was concerned about the heat and exerting himself. Despite not taking Hydralazine , BP after playing 18 holes of golf yesterday 105/68 and  102/70 which quickly recovered. Reports no shortness of breath nor dyspnea on exertion. Reports no chest pain, pressure, or tightness. No edema, orthopnea, PND. Reports no palpitations.  He has lost 8 lbs since clinic visit 3 months ago and is presently on Zepbound  5mg  weekly.   Previous antihypertensives:  Past Medical History:  Diagnosis Date   Acute meniscal tear of left knee    Allergy    fire ants   Back pain    Complication of anesthesia    Coronary artery disease    Edema of both lower extremities    Fatigue    History of kidney stones    Hyperlipidemia    Hypertension    Joint pain    Osteoarthritis    PONV (postoperative nausea and vomiting)    Sleep apnea    SOB (shortness of breath) on exertion    Vitamin D  deficiency     Past Surgical History:  Procedure Laterality Date   BACK SURGERY     laminectomy L5-S1   COLONOSCOPY WITH PROPOFOL  N/A 03/14/2017   Procedure: COLONOSCOPY WITH PROPOFOL ;  Surgeon: Ozell Blunt, MD;  Location: [facility];  Service: Endoscopy;  Laterality: N/A;   Colonscopy     polyps   CYSTOSCOPY KIDNEY W/ URETERAL GUIDE WIRE  2010   lithotripsy   CYSTOSCOPY/RETROGRADE/URETEROSCOPY/STONE EXTRACTION WITH BASKET Right 02/16/2016   Procedure: CYSTOSCOPY/RETROGRADE/RIGHT FLEXIBLE URETEROSCOPY/STONE EXTRACTION WITH BASKET;  Surgeon: Florencio Hunting, MD;  Location: [facility];  Service: Urology;  Laterality: Right;   HOLMIUM LASER APPLICATION Left 02/16/2016   Procedure: HOLMIUM LASER APPLICATION;  Surgeon: Florencio Hunting, MD;  Location: [facility];  Service: Urology;  Laterality: Left;  KNEE ARTHROSCOPY Right 2013   KNEE ARTHROSCOPY WITH MEDIAL MENISECTOMY Left 03/12/2015   Procedure: LEFT KNEE ARTHROSCOPY WITH PARTIAL MEDIAL MENISCECTOMY;  Surgeon: Arnie Lao, MD;  Location: [facility];  Service: Orthopedics;  Laterality: Left;   LAMINECTOMY  1999   MR HIPS BILATERAL     2009&2010   TOTAL HIP REVISION  09/15/2011   Procedure: TOTAL HIP REVISION;   Surgeon: Arnie Lao, MD;  Location: [facility];  Service: Orthopedics;  Laterality: Right;  Revision right hip acetabular component    Current Medications: Current Meds  Medication Sig   allopurinol  (ZYLOPRIM ) 300 MG tablet Take 1 tablet (300 mg total) by mouth daily.   amLODipine  (NORVASC ) 10 MG tablet Take 1 tablet (10 mg total) by mouth daily.   Cholecalciferol (VITAMIN D3) 50 MCG (2000 UT) TABS Take by mouth daily.   Loratadine 10 MG CAPS Take 10 mg by mouth at bedtime.    metFORMIN  (GLUCOPHAGE ) 500 MG tablet Take 1 tablet (500 mg total) by mouth daily with breakfast.   rosuvastatin  (CRESTOR ) 10 MG tablet Take 1 tablet (10 mg total) by mouth 3 (three) times a week.   spironolactone  (ALDACTONE ) 25 MG tablet Take 1 tablet (25 mg total) by mouth daily.   tadalafil  (CIALIS ) 10 MG tablet Take 1 tablet (10 mg total) by mouth every other day as needed for erectile dysfunction.   terbinafine  (LAMISIL ) 250 MG tablet Take 1 tablet (250 mg total) by mouth daily.   tirzepatide  (ZEPBOUND ) 5 MG/0.5ML Pen Inject 5 mg into the skin once a week.   triamcinolone  cream (KENALOG ) 0.1 % Apply 1 Application topically 2 (two) times daily as needed. Apply topically after the shower daily.   valsartan -hydrochlorothiazide  (DIOVAN -HCT) 320-25 MG tablet Take 1 tablet by mouth daily.   [DISCONTINUED] hydrALAZINE  (APRESOLINE ) 25 MG tablet Take 1 tablet (25 mg total) by mouth in the morning and at bedtime.     Allergies:   Patient has no known allergies.   Social History   Socioeconomic History   Marital status: Married    Spouse name: Not on file   Number of children: 1   Years of education: Not on file   Highest education level: Not on file  Occupational History   Not on file  Tobacco Use   Smoking status: Never   Smokeless tobacco: Never  Vaping Use   Vaping status: Never Used  Substance and Sexual Activity   Alcohol use: Yes    Alcohol/week: 7.0 standard drinks of alcohol    Types: 7 Glasses  of wine per week    Comment: glass of wine per night   Drug use: No   Sexual activity: Not on file  Other Topics Concern   Not on file  Social History Narrative   Not on file   Social Drivers of Health   Financial Resource Strain: Low Risk  (12/01/2022)   Overall Financial Resource Strain (CARDIA)    Difficulty of Paying Living Expenses: Not hard at all  Food Insecurity: No Food Insecurity (12/01/2022)   Hunger Vital Sign    Worried About Running Out of Food in the Last Year: Never true    Ran Out of Food in the Last Year: Never true  Transportation Needs: No Transportation Needs (12/01/2022)   PRAPARE - Administrator, Civil Service (Medical): No    Lack of Transportation (Non-Medical): No  Physical Activity: Sufficiently Active (12/01/2022)   Exercise Vital Sign    Days of Exercise  per Week: 7 days    Minutes of Exercise per Session: 30 min  Stress: No Stress Concern Present (12/01/2022)   [employer] of Occupational Health - Occupational Stress Questionnaire    Feeling of Stress : Not at all  Social Connections: Socially Integrated (12/01/2022)   Social Connection and Isolation Panel    Frequency of Communication with Friends and Family: More than three times a week    Frequency of Social Gatherings with Friends and Family: More than three times a week    Attends Religious Services: More than 4 times per year    Active Member of [employer] or Organizations: Yes    Attends Engineer, structural: More than 4 times per year    Marital Status: Married     Family History: The patient's family history includes Anemia in his father; Cancer in his father; Colon cancer in his father; Diabetes in his father and mother; Heart attack in his sister; Heart disease in his mother; Heart failure in his brother; Hypertension in his father and mother; Kidney failure in his mother; Obesity in his brother, brother, brother, brother, father, mother, and sister; Prostatitis in  his brother; Skin cancer in his father; Sleep apnea in his brother; Stroke in his mother. There is no history of Prostate cancer.  ROS:   Please see the history of present illness.    All other systems reviewed and are negative.  EKGs/Labs/Other Studies Reviewed:         Recent Labs: 11/09/2022: Hemoglobin 15.1; Platelets 143.0; TSH 2.26 01/24/2023: ALT 21 04/12/2023: BUN 37; Creatinine, Ser 1.38; Magnesium  1.9; Potassium 5.0; Sodium 139   Recent Lipid Panel    Component Value Date/Time   CHOL 161 01/24/2023 1436   TRIG 113 01/24/2023 1436   HDL 52 01/24/2023 1436   CHOLHDL 3.1 01/24/2023 1436   CHOLHDL 3 11/09/2022 0914   VLDL 11.6 11/09/2022 0914   LDLCALC 89 01/24/2023 1436   LDLCALC 104 (H) 06/05/2018 1352    Physical Exam:   VS:  BP 134/86   Pulse 60   Ht 5' 9.5 (1.765 m)   Wt (!) 306 lb 11.2 oz (139.1 kg)   SpO2 96%   BMI 44.64 kg/m  , BMI Body mass index is 44.64 kg/m. GENERAL:  Well appearing HEENT: Pupils equal round and reactive, fundi not visualized, oral mucosa unremarkable NECK:  No jugular venous distention, waveform within normal limits, carotid upstroke brisk and symmetric, no bruits, no thyromegaly LYMPHATICS:  No cervical adenopathy LUNGS:  Clear to auscultation bilaterally HEART:  RRR.  PMI not displaced or sustained,S1 and S2 within normal limits, no S3, no S4, no clicks, no rubs, no murmurs ABD:  Flat, positive bowel sounds normal in frequency in pitch, no bruits, no rebound, no guarding, no midline pulsatile mass, no hepatomegaly, no splenomegaly EXT:  2 plus pulses throughout, no edema, no cyanosis no clubbing SKIN:  No rashes no nodules NEURO:  Cranial nerves II through XII grossly intact, motor grossly intact throughout PSYCH:  Cognitively intact, oriented to person place and time   ASSESSMENT/PLAN:    HTN - BP nearly at goal <130/80 in clinic however episodes of hypotension at home or with golfing. Continue Amlodipine  10mg  daily,  Valsartan -hydrochlorothiazide  320-25mg  daily, Spironolactone  25 mg daily. Stop BID Hydralazine , instead will use only PRN for SBP >180. Discussed to monitor BP at home at least 2 hours after medications and sitting for 5-10 minutes.  Anticipate BP will continue to improve with weight loss,  if recurrent hypotension plan to reduce vs discontinue Spironolactone .     Right renal artery stenosis -renal duplex 11/2022 with right 1-59% stenosis.  Continue rosuvastatin  to prevent progression.  Consider repeat imaging in 1 year for monitoring.  Nonobstructive CAD / HLD, LDL goal <70 - 04/2020 calcium  score of 15 with mild dilation ascending aorta 41mm. 11/09/22 LDL 101.  Stable with no anginal symptoms. No indication for ischemic evaluation.  01/2023 LDL 89.  Goal LDL < 70.  Prefers to continue working on lifestyle changes and repeat lipids in 6 months to reassess.  Consider lipid panel at follow up. Recommend aiming for 150 minutes of moderate intensity activity per week and following a heart healthy diet.    OSA - Treated with dental device. Additionally, now on Zepbound .   Screening for Secondary Hypertension:     11/30/2022    4:00 PM  Causes  Renovascular HTN Screened  Sleep Apnea Screened     - Comments treated  Thyroid  Disease Screened  Hyperaldosteronism Screened  Pheochromocytoma Screened  Cushing's Syndrome N/A  Coarctation of the Aorta N/A  Compliance Screened    Relevant Labs/Studies:    Latest Ref Rng & Units 04/12/2023    2:47 PM 04/03/2023    3:01 PM 02/15/2023   10:23 AM  Basic Labs  Sodium 134 - 144 mmol/L 139  139  140   Potassium 3.5 - 5.2 mmol/L 5.0  4.9  4.7   Creatinine 0.76 - 1.27 mg/dL 1.61  0.96  0.45        Latest Ref Rng & Units 11/09/2022    9:14 AM 01/03/2021   10:22 AM  Thyroid    TSH 0.35 - 5.50 uIU/mL 2.26  2.63        Latest Ref Rng & Units 02/15/2023   10:23 AM  Renin/Aldosterone   Aldosterone 0.0 - 30.0 ng/dL 4.7   Aldos/Renin Ratio 0.0 - 30.0 0.8         Latest Ref Rng & Units 01/24/2023    2:36 PM 12/21/2022    8:20 AM  Metanephrines/Catecholamines   Epinephrine 0 - 62 pg/mL <15  <15   Norepinephrine 0 - 874 pg/mL 1,217  662   Dopamine 0 - 48 pg/mL 35  <30   Metanephrines 0.0 - 88.0 pg/mL <25.0  <25.0   Normetanephrines  0.0 - 285.2 pg/mL 114.6  78.5           12/26/2022   11:18 AM  Renovascular   Renal Artery US  Completed Yes      Disposition:    FU with MD/PharmD/APP in 6 months    Medication Adjustments/Labs and Tests Ordered: Current medicines are reviewed at length with the patient today.  Concerns regarding medicines are outlined above.  No orders of the defined types were placed in this encounter.  Meds ordered this encounter  Medications   hydrALAZINE  (APRESOLINE ) 25 MG tablet    Sig: Take 1 tablet (25 mg total) by mouth 2 (two) times daily as needed. For systolic blood pressure 180 or more.    Dispense:  60 tablet    Refill:  3    Supervising Provider:   Sheryle Donning [[id number]]     Signed, Clearnce Curia, NP  06/29/2023 2:53 PM    [facility]

## 2023-06-28 NOTE — Patient Instructions (Addendum)
 Medication Instructions:  CHANGE Hydralazine  to only as-needed for systolic blood pressure more than 80     Follow-Up: Please follow up in 6  months in [facility] with Dr. Theodis Fiscal, Neomi Banks, NP or Donivan Furry PharmD    Special Instructions:   Check blood pressure 2-3 times per week and let us  know if consistently more than 140 or less than 120 for top number.

## 2023-06-29 ENCOUNTER — Other Ambulatory Visit (HOSPITAL_COMMUNITY): Payer: Self-pay

## 2023-06-29 ENCOUNTER — Encounter (HOSPITAL_BASED_OUTPATIENT_CLINIC_OR_DEPARTMENT_OTHER): Payer: Self-pay | Admitting: Family

## 2023-07-01 ENCOUNTER — Ambulatory Visit: Payer: Self-pay | Admitting: Dermatology

## 2023-07-03 ENCOUNTER — Encounter (INDEPENDENT_AMBULATORY_CARE_PROVIDER_SITE_OTHER): Admitting: Family Medicine

## 2023-07-05 NOTE — Progress Notes (Signed)
 erroe

## 2023-07-12 ENCOUNTER — Other Ambulatory Visit (HOSPITAL_COMMUNITY): Payer: Self-pay

## 2023-08-09 ENCOUNTER — Ambulatory Visit (INDEPENDENT_AMBULATORY_CARE_PROVIDER_SITE_OTHER): Admitting: Family Medicine

## 2023-08-09 ENCOUNTER — Encounter (INDEPENDENT_AMBULATORY_CARE_PROVIDER_SITE_OTHER): Payer: Self-pay | Admitting: Family Medicine

## 2023-08-09 ENCOUNTER — Other Ambulatory Visit (HOSPITAL_COMMUNITY): Payer: Self-pay

## 2023-08-09 VITALS — BP 131/67 | HR 58 | Temp 97.8°F | Ht 68.0 in | Wt 288.0 lb

## 2023-08-09 DIAGNOSIS — G4733 Obstructive sleep apnea (adult) (pediatric): Secondary | ICD-10-CM | POA: Diagnosis not present

## 2023-08-09 DIAGNOSIS — R7303 Prediabetes: Secondary | ICD-10-CM

## 2023-08-09 DIAGNOSIS — R632 Polyphagia: Secondary | ICD-10-CM

## 2023-08-09 DIAGNOSIS — I10 Essential (primary) hypertension: Secondary | ICD-10-CM | POA: Diagnosis not present

## 2023-08-09 DIAGNOSIS — Z6841 Body Mass Index (BMI) 40.0 and over, adult: Secondary | ICD-10-CM

## 2023-08-09 DIAGNOSIS — E669 Obesity, unspecified: Secondary | ICD-10-CM

## 2023-08-09 DIAGNOSIS — E559 Vitamin D deficiency, unspecified: Secondary | ICD-10-CM

## 2023-08-09 MED ORDER — TIRZEPATIDE-WEIGHT MANAGEMENT 5 MG/0.5ML ~~LOC~~ SOAJ
5.0000 mg | SUBCUTANEOUS | 2 refills | Status: DC
Start: 2023-08-09 — End: 2023-09-06
  Filled 2023-08-09: qty 2, 28d supply, fill #0

## 2023-08-09 MED ORDER — METFORMIN HCL 500 MG PO TABS
500.0000 mg | ORAL_TABLET | Freq: Every day | ORAL | 1 refills | Status: DC
  Filled 2023-08-09 – 2023-10-09 (×2): qty 90, 90d supply, fill #0

## 2023-08-09 NOTE — Progress Notes (Signed)
 Office: [phone]  /  Fax: [phone]  WEIGHT SUMMARY AND BIOMETRICS  Anthropometric Measurements Height: 5' 8 (1.727 m) Weight: 288 lb (130.6 kg) BMI (Calculated): 43.8 Weight at Last Visit: 310 lb Weight Lost Since Last Visit: 22 lb Weight Gained Since Last Visit: 0 Starting Weight: 317 lb Total Weight Loss (lbs): 29 lb (13.2 kg) Peak Weight: 320 lb   Body Composition  Body Fat %: 41.2 % Fat Mass (lbs): 118.6 lbs Muscle Mass (lbs): 161 lbs Total Body Water (lbs): 130.8 lbs Visceral Fat Rating : 29   Other Clinical Data Fasting: no Labs: no Today's Visit #: 29 Starting Date: 02/02/21    Chief Complaint: OBESITY    History of Present Illness Lynwood FORBES Better, MD Keith Brown is a 68 year old male who presents for obesity treatment.  He has been following a category three eating plan about 50-60% of the time and has increased his physical activity, including golfing and yard work for one to four hours a week. He has lost 22 pounds in the last two months and feels more physically active, engaging in activities such as working on his car and playing golf weekly with friends. However, he experiences exhaustion and dizziness in the heat, particularly during the last few holes of golf, which he attributes to dehydration and possibly the effects of metformin  when not eating breakfast.  He generally eats two good meals a day and avoids snacking, especially on unhealthy foods. He tries to keep healthy snacks like vegetables available, particularly when at the beach. He has been taking metformin  and Zepbound  for his conditions. Initially, Zepbound  caused stomach upset and cramping, but these symptoms have subsided. He also notes bruising at the injection site occasionally.  He has been prescribed terbinafine  for toenail fungus and is cautious about liver health, avoiding alcohol and limiting caffeine intake. He takes over-the-counter vitamin D  at a dose of 2000 IU daily. He  experiences cramping from his knees down to his toes, which he manages with electrolyte packets, pickle juice, and mustard. He also uses Gatorade and water to stay hydrated during activities.  He has reduced his use of hydralazine  after noting lower blood pressure readings. He monitors his blood pressure regularly and notes it has been stable.      PHYSICAL EXAM:  Blood pressure 131/67, pulse (!) 58, temperature 97.8 F (36.6 C), height 5' 8 (1.727 m), weight 288 lb (130.6 kg), SpO2 96%. Body mass index is 43.79 kg/m.  DIAGNOSTIC DATA REVIEWED:  BMET    Component Value Date/Time   NA 139 04/12/2023 1447   K 5.0 04/12/2023 1447   CL 103 04/12/2023 1447   CO2 20 04/12/2023 1447   GLUCOSE 90 04/12/2023 1447   GLUCOSE 105 (H) 11/09/2022 0914   BUN 37 (H) 04/12/2023 1447   CREATININE 1.38 (H) 04/12/2023 1447   CREATININE 0.99 06/05/2018 1352   CALCIUM  10.0 04/12/2023 1447   GFRNONAA 43 (L) 02/16/2016 1839   GFRAA 49 (L) 02/16/2016 1839   Lab Results  Component Value Date   HGBA1C 5.4 11/09/2022   HGBA1C 5.6 01/23/2017   Lab Results  Component Value Date   INSULIN  19.4 07/25/2021   INSULIN  20.6 02/02/2021   Lab Results  Component Value Date   TSH 2.26 11/09/2022   CBC    Component Value Date/Time   WBC 6.8 11/09/2022 0914   RBC 5.18 11/09/2022 0914   HGB 15.1 11/09/2022 0914   HGB 15.7 07/25/2021 0742   HCT 46.9 11/09/2022  0914   HCT 46.8 07/25/2021 0742   PLT 143.0 (L) 11/09/2022 0914   PLT 156 07/25/2021 0742   MCV 90.5 11/09/2022 0914   MCV 89 07/25/2021 0742   MCH 29.9 07/25/2021 0742   MCH 29.4 03/31/2020 1510   MCHC 32.1 11/09/2022 0914   RDW 14.4 11/09/2022 0914   RDW 13.1 07/25/2021 0742   Iron Studies    Component Value Date/Time   IRON 108 03/31/2020 1510   TIBC 307 03/31/2020 1510   FERRITIN 451 (H) 03/31/2020 1510   IRONPCTSAT 35 03/31/2020 1510   Lipid Panel     Component Value Date/Time   CHOL 161 01/24/2023 1436   TRIG 113  01/24/2023 1436   HDL 52 01/24/2023 1436   CHOLHDL 3.1 01/24/2023 1436   CHOLHDL 3 11/09/2022 0914   VLDL 11.6 11/09/2022 0914   LDLCALC 89 01/24/2023 1436   LDLCALC 104 (H) 06/05/2018 1352   Hepatic Function Panel     Component Value Date/Time   PROT 6.9 01/24/2023 1436   ALBUMIN  4.5 01/24/2023 1436   AST 22 01/24/2023 1436   ALT 21 01/24/2023 1436   ALKPHOS 77 01/24/2023 1436   BILITOT 0.7 01/24/2023 1436   BILIDIR 0.22 01/24/2023 1436      Component Value Date/Time   TSH 2.26 11/09/2022 0914   Nutritional Lab Results  Component Value Date   VD25OH 39.95 11/09/2022   VD25OH 59.5 07/25/2021   VD25OH 61.59 01/03/2021     Assessment and Plan Assessment & Plan Obesity and polyphagia He has lost 22 pounds in the last two months by following the category three eating plan 50-60% of the time and increasing physical activity, including golfing and yard work. He is on Zepbound , which initially caused gastrointestinal discomfort but has since subsided. He is experiencing positive outcomes from the current regimen. - Refill Zepbound  - Encourage adherence to the eating plan - Encourage regular physical activity - Ensure adequate hydration and electrolyte intake during physical activities  Prediabetes He is managing his condition with metformin , dietary changes, and increased physical activity. He experiences dizziness and exhaustion when not eating breakfast, especially when taking metformin , indicating the importance of regular meals. He plans to have lab tests to monitor blood glucose levels, especially after fasting, to mimic conditions during physical activities like golfing. - Continue metformin  - Encourage regular meals, especially breakfast - Order lab tests to monitor blood glucose levels  Hypertension His blood pressure is well-controlled at 131/67 mmHg. He has discontinued hydralazine  due to low blood pressure readings and is managing hypertension with other  medications. The goal is to potentially reduce medication as weight loss continues. - Continue current antihypertensive regimen - Continue diet, exercise and weight loss and hydration  Vit D deficiency He is taking over-the-counter vitamin D  at 2000 IU daily. - Continue vitamin D  supplementation at 2000 IU daily - Check labs  OSA on CPAP He is doing well with his weight loss efforts to treat his OSA -Continue diet, exercise and weight loss efforts documented above,  -Behavior Modifications include increasing water and protein and minimize eating out.  Follow-up He has a follow-up appointment scheduled for August 20th and plans to schedule another appointment in early September before traveling. - Schedule follow-up appointment for early September - Perform lab tests today      He was informed of the importance of frequent follow up visits to maximize his success with intensive lifestyle modifications for his multiple health conditions.    Louann Penton, MD

## 2023-08-10 ENCOUNTER — Encounter (INDEPENDENT_AMBULATORY_CARE_PROVIDER_SITE_OTHER): Payer: Self-pay | Admitting: Family Medicine

## 2023-08-10 ENCOUNTER — Other Ambulatory Visit (HOSPITAL_COMMUNITY): Payer: Self-pay

## 2023-08-10 LAB — CMP14+EGFR
ALT: 16 IU/L (ref 0–44)
AST: 19 IU/L (ref 0–40)
Albumin: 4.5 g/dL (ref 3.9–4.9)
Alkaline Phosphatase: 69 IU/L (ref 44–121)
BUN/Creatinine Ratio: 28 — ABNORMAL HIGH (ref 10–24)
BUN: 55 mg/dL — ABNORMAL HIGH (ref 8–27)
Bilirubin Total: 0.3 mg/dL (ref 0.0–1.2)
CO2: 16 mmol/L — ABNORMAL LOW (ref 20–29)
Calcium: 9.4 mg/dL (ref 8.6–10.2)
Chloride: 104 mmol/L (ref 96–106)
Creatinine, Ser: 1.95 mg/dL — ABNORMAL HIGH (ref 0.76–1.27)
Globulin, Total: 2.2 g/dL (ref 1.5–4.5)
Glucose: 95 mg/dL (ref 70–99)
Potassium: 4.9 mmol/L (ref 3.5–5.2)
Sodium: 137 mmol/L (ref 134–144)
Total Protein: 6.7 g/dL (ref 6.0–8.5)
eGFR: 37 mL/min/1.73 — ABNORMAL LOW (ref >59)

## 2023-08-10 LAB — LIPID PANEL WITH LDL/HDL RATIO
Cholesterol, Total: 162 mg/dL (ref 100–199)
HDL: 43 mg/dL (ref >39)
LDL Chol Calc (NIH): 104 mg/dL — ABNORMAL HIGH (ref 0–99)
LDL/HDL Ratio: 2.4 ratio (ref 0.0–3.6)
Triglycerides: 78 mg/dL (ref 0–149)
VLDL Cholesterol Cal: 15 mg/dL (ref 5–40)

## 2023-08-10 LAB — VITAMIN B12: Vitamin B-12: 433 pg/mL (ref 232–1245)

## 2023-08-10 LAB — VITAMIN D 25 HYDROXY (VIT D DEFICIENCY, FRACTURES): Vit D, 25-Hydroxy: 34.4 ng/mL (ref 30.0–100.0)

## 2023-08-10 LAB — HEMOGLOBIN A1C
Est. average glucose Bld gHb Est-mCnc: 108 mg/dL
Hgb A1c MFr Bld: 5.4 % (ref 4.8–5.6)

## 2023-08-10 LAB — TSH: TSH: 1.79 u[IU]/mL (ref 0.450–4.500)

## 2023-08-10 LAB — INSULIN, RANDOM: INSULIN: 17.7 u[IU]/mL (ref 2.6–24.9)

## 2023-08-13 ENCOUNTER — Other Ambulatory Visit (INDEPENDENT_AMBULATORY_CARE_PROVIDER_SITE_OTHER): Payer: Self-pay | Admitting: Family Medicine

## 2023-08-13 DIAGNOSIS — E860 Dehydration: Secondary | ICD-10-CM

## 2023-09-04 ENCOUNTER — Ambulatory Visit (INDEPENDENT_AMBULATORY_CARE_PROVIDER_SITE_OTHER): Admitting: Family Medicine

## 2023-09-06 ENCOUNTER — Ambulatory Visit (INDEPENDENT_AMBULATORY_CARE_PROVIDER_SITE_OTHER): Admitting: Family Medicine

## 2023-09-06 ENCOUNTER — Other Ambulatory Visit (HOSPITAL_COMMUNITY): Payer: Self-pay

## 2023-09-06 ENCOUNTER — Encounter (INDEPENDENT_AMBULATORY_CARE_PROVIDER_SITE_OTHER): Payer: Self-pay | Admitting: Family Medicine

## 2023-09-06 VITALS — BP 151/81 | HR 57 | Temp 98.3°F | Ht 68.0 in | Wt 288.0 lb

## 2023-09-06 DIAGNOSIS — I10 Essential (primary) hypertension: Secondary | ICD-10-CM

## 2023-09-06 DIAGNOSIS — E66813 Obesity, class 3: Secondary | ICD-10-CM

## 2023-09-06 DIAGNOSIS — N1832 Chronic kidney disease, stage 3b: Secondary | ICD-10-CM

## 2023-09-06 DIAGNOSIS — E860 Dehydration: Secondary | ICD-10-CM | POA: Diagnosis not present

## 2023-09-06 DIAGNOSIS — I129 Hypertensive chronic kidney disease with stage 1 through stage 4 chronic kidney disease, or unspecified chronic kidney disease: Secondary | ICD-10-CM | POA: Diagnosis not present

## 2023-09-06 DIAGNOSIS — R7303 Prediabetes: Secondary | ICD-10-CM

## 2023-09-06 DIAGNOSIS — Z6841 Body Mass Index (BMI) 40.0 and over, adult: Secondary | ICD-10-CM

## 2023-09-06 DIAGNOSIS — E669 Obesity, unspecified: Secondary | ICD-10-CM

## 2023-09-06 MED ORDER — TIRZEPATIDE 7.5 MG/0.5ML ~~LOC~~ SOAJ
7.5000 mg | SUBCUTANEOUS | 0 refills | Status: DC
  Filled 2023-09-06: qty 6, 84d supply, fill #0

## 2023-09-06 NOTE — Progress Notes (Signed)
 Office: [phone]  /  Fax: [phone]  WEIGHT SUMMARY AND BIOMETRICS  Anthropometric Measurements Height: 5' 8 (1.727 m) Weight: 288 lb (130.6 kg) BMI (Calculated): 43.8 Weight at Last Visit: 288 lb Weight Lost Since Last Visit: 0 Weight Gained Since Last Visit: 0 Starting Weight: 317 lb Total Weight Loss (lbs): 29 lb (13.2 kg) Peak Weight: 320 lb   Body Composition  Body Fat %: 41.3 % Fat Mass (lbs): 119.2 lbs Muscle Mass (lbs): 161 lbs Total Body Water (lbs): 130.6 lbs Visceral Fat Rating : 29   Other Clinical Data Fasting: yes Labs: yes Today's Visit #: 30 Starting Date: 02/02/21    Chief Complaint: OBESITY   History of Present Illness Keith Brown Better, 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  He has maintained his weight over the past month since his last visit. He uses Zepbound  for weight management, which initially works well but seems to lose effectiveness by the weekend, coinciding with increased exposure to food. He considers taking it on a Thursday to maintain its effectiveness over the weekend.  He has chronic kidney disease, currently classified as stage 3B, with a recent decrease in GFR. He reports possible dehydration and use of Naprosyn around the time of his recent decline in kidney function. He is currently on metformin , spironolactone , and Diovan  HCT.  He has a history of prediabetes and is on metformin . He is also on Diovan  HCT and Norvasc  for hypertension. His blood pressure was recorded at 143/71 and 151/81 during the visit. Hydralazine  was previously used but was stopped due to its strong effect, and he now uses it as needed. He has not taken it since it was changed to PRN in March or April.  He discusses his social history, mentioning an upcoming trip to [place]  for a cultural event related to his mother's heritage. He is a registered member  of a tribe and plans to visit family and explore the area.      PHYSICAL EXAM:  Blood pressure (!) 151/81, pulse (!) 57, temperature 98.3 F (36.8 C), height 5' 8 (1.727 m), weight 288 lb (130.6 kg), SpO2 98%. Body mass index is 43.79 kg/m.  DIAGNOSTIC DATA REVIEWED:  BMET    Component Value Date/Time   NA 137 08/09/2023 1120   K 4.9 08/09/2023 1120   CL 104 08/09/2023 1120   CO2 16 (L) 08/09/2023 1120   GLUCOSE 95 08/09/2023 1120   GLUCOSE 105 (H) 11/09/2022 0914   BUN 55 (H) 08/09/2023 1120   CREATININE 1.95 (H) 08/09/2023 1120   CREATININE 0.99 06/05/2018 1352   CALCIUM  9.4 08/09/2023 1120   GFRNONAA 43 (L) 02/16/2016 1839   GFRAA 49 (L) 02/16/2016 1839   Lab Results  Component Value Date   HGBA1C 5.4 08/09/2023   HGBA1C 5.6 01/23/2017   Lab Results  Component Value Date   INSULIN  17.7 08/09/2023   INSULIN  20.6 02/02/2021   Lab Results  Component Value Date   TSH 1.790 08/09/2023   CBC    Component Value Date/Time   WBC 6.8 11/09/2022 0914   RBC 5.18 11/09/2022 0914   HGB 15.1 11/09/2022 0914   HGB 15.7 07/25/2021 0742   HCT 46.9 11/09/2022 0914   HCT 46.8 07/25/2021 0742   PLT 143.0 (L) 11/09/2022 0914   PLT 156 07/25/2021 0742   MCV 90.5 11/09/2022 0914   MCV 89 07/25/2021 0742   MCH  29.9 07/25/2021 0742   MCH 29.4 03/31/2020 1510   MCHC 32.1 11/09/2022 0914   RDW 14.4 11/09/2022 0914   RDW 13.1 07/25/2021 0742   Iron Studies    Component Value Date/Time   IRON 108 03/31/2020 1510   TIBC 307 03/31/2020 1510   FERRITIN 451 (H) 03/31/2020 1510   IRONPCTSAT 35 03/31/2020 1510   Lipid Panel     Component Value Date/Time   CHOL 162 08/09/2023 1120   TRIG 78 08/09/2023 1120   HDL 43 08/09/2023 1120   CHOLHDL 3.1 01/24/2023 1436   CHOLHDL 3 11/09/2022 0914   VLDL 11.6 11/09/2022 0914   LDLCALC 104 (H) 08/09/2023 1120   LDLCALC 104 (H) 06/05/2018 1352   Hepatic Function Panel     Component Value Date/Time   PROT 6.7 08/09/2023 1120    ALBUMIN  4.5 08/09/2023 1120   AST 19 08/09/2023 1120   ALT 16 08/09/2023 1120   ALKPHOS 69 08/09/2023 1120   BILITOT 0.3 08/09/2023 1120   BILIDIR 0.22 01/24/2023 1436      Component Value Date/Time   TSH 1.790 08/09/2023 1120   Nutritional Lab Results  Component Value Date   VD25OH 34.4 08/09/2023   VD25OH 39.95 11/09/2022   VD25OH 59.5 07/25/2021     Assessment and Plan Assessment & Plan Obesity Obesity management with Zepbound  is ongoing. The medication is effective initially but seems to wear off by the weekend, coinciding with increased exposure to food. - Advise taking Zepbound  on Thursday to maximize effect over the weekend - Consider increasing Zepbound  dose to 7.5 if labs are normal  Chronic kidney disease stage 3B and dehydration Chronic kidney disease has progressed to stage 3B with a recent decrease in GFR to 37. Consideration to stop spironolactone  due to its potential contribution to dehydration, especially since he is also on hydrochlorothiazide . Discussed that if GFR drops to 30, metformin  will be discontinued. - Recheck labs to monitor kidney function - Advise increased hydration - Consider stopping spironolactone  if dehydration persists - Tight BP control important for renal function  Hypertension Blood pressure remains elevated with recent readings of 143/71 and 151/81. Currently on Diovan  HCT and Norvasc . Hydralazine  was previously used but is now taken PRN due to its strong effect on lowering blood pressure. - Continue Diovan  HCT and Norvasc  - Use hydralazine  PRN based on blood pressure readings  Prediabetes Prediabetes is being managed with metformin . The current dose is small, and he has been informed that metformin  will be discontinued if GFR drops to 30. - Continue metformin  at current dose - Monitor kidney function closely - Discontinue metformin  if GFR drops to 30      He was informed of the importance of frequent follow up visits to maximize  his success with intensive lifestyle modifications for his multiple health conditions.    Louann Penton, MD

## 2023-09-07 LAB — CMP14+EGFR
ALT: 18 IU/L (ref 0–44)
AST: 22 IU/L (ref 0–40)
Albumin: 4.2 g/dL (ref 3.9–4.9)
Alkaline Phosphatase: 75 IU/L (ref 44–121)
BUN/Creatinine Ratio: 25 — ABNORMAL HIGH (ref 10–24)
BUN: 37 mg/dL — ABNORMAL HIGH (ref 8–27)
Bilirubin Total: 0.4 mg/dL (ref 0.0–1.2)
CO2: 17 mmol/L — ABNORMAL LOW (ref 20–29)
Calcium: 9.6 mg/dL (ref 8.6–10.2)
Chloride: 105 mmol/L (ref 96–106)
Creatinine, Ser: 1.46 mg/dL — ABNORMAL HIGH (ref 0.76–1.27)
Globulin, Total: 2.4 g/dL (ref 1.5–4.5)
Glucose: 94 mg/dL (ref 70–99)
Potassium: 4.8 mmol/L (ref 3.5–5.2)
Sodium: 137 mmol/L (ref 134–144)
Total Protein: 6.6 g/dL (ref 6.0–8.5)
eGFR: 52 mL/min/1.73 — ABNORMAL LOW (ref >59)

## 2023-09-25 ENCOUNTER — Ambulatory Visit (INDEPENDENT_AMBULATORY_CARE_PROVIDER_SITE_OTHER): Admitting: Dermatology

## 2023-09-25 ENCOUNTER — Encounter: Payer: Self-pay | Admitting: Dermatology

## 2023-09-25 ENCOUNTER — Other Ambulatory Visit (HOSPITAL_COMMUNITY): Payer: Self-pay

## 2023-09-25 ENCOUNTER — Other Ambulatory Visit: Payer: Self-pay | Admitting: Dermatology

## 2023-09-25 VITALS — BP 130/70 | HR 63

## 2023-09-25 DIAGNOSIS — B351 Tinea unguium: Secondary | ICD-10-CM

## 2023-09-25 DIAGNOSIS — Z872 Personal history of diseases of the skin and subcutaneous tissue: Secondary | ICD-10-CM | POA: Diagnosis not present

## 2023-09-25 DIAGNOSIS — L570 Actinic keratosis: Secondary | ICD-10-CM

## 2023-09-25 MED ORDER — TERBINAFINE HCL 250 MG PO TABS: 250.0000 mg | ORAL_TABLET | Freq: Every day | ORAL | Status: DC

## 2023-09-25 NOTE — Patient Instructions (Signed)

## 2023-09-25 NOTE — Progress Notes (Signed)
   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 Pt following up on toenail fungus. He has been on terbinafine  for 30 days. He feels it has improved.  The following portions of the chart were reviewed this encounter and updated as appropriate: medications, allergies, medical history  Review of Systems:  No other skin or systemic complaints except as noted in HPI or Assessment and Plan.  Objective  Well appearing patient in no apparent distress; mood and affect are within normal limits.  A focused examination was performed of the following areas: Bilateral feet  Relevant exam findings are noted in the Assessment and Plan.       Assessment & Plan   HISTORY OF PRECANCEROUS ACTINIC KERATOSIS - site(s) of PreCancerous Actinic Keratosis clear today. - these may recur and new lesions may form requiring treatment to prevent transformation into skin cancer - observe for new or changing spots and contact [facility] for appointment if occur - photoprotection with sun protective clothing; sunglasses and broad spectrum sunscreen with SPF of at least 30 + and frequent self skin exams recommended - yearly exams by a dermatologist recommended for persons with history of PreCancerous Actinic Keratoses   ONYCHOMYCOSIS Exam: Thickened toenails with subungal debris c/w onychomycosis, improving but not treatment goal  Treatment Plan: Benign-appearing.  Observation.  Patient deferred treatment at this time.  Continue terbinafine  250 mg daily  Return in about 10 weeks (around 12/04/2023) for toenail fungus.  I, Darice Smock, CMA, am acting as scribe for RUFUS CHRISTELLA HOLY, MD.   Documentation: I have reviewed the above documentation for accuracy and completeness, and I agree with the above.  RUFUS CHRISTELLA HOLY, MD

## 2023-09-26 ENCOUNTER — Other Ambulatory Visit (HOSPITAL_COMMUNITY): Payer: Self-pay

## 2023-09-26 MED ORDER — TERBINAFINE HCL 250 MG PO TABS
250.0000 mg | ORAL_TABLET | Freq: Every day | ORAL | 0 refills | Status: DC
  Filled 2023-09-26: qty 90, 90d supply, fill #0

## 2023-10-09 ENCOUNTER — Other Ambulatory Visit (HOSPITAL_COMMUNITY): Payer: Self-pay

## 2023-10-11 ENCOUNTER — Other Ambulatory Visit (HOSPITAL_COMMUNITY): Payer: Self-pay

## 2023-10-11 MED ORDER — COVID-19 MRNA VAC-TRIS(PFIZER) 30 MCG/0.3ML IM SUSY
0.3000 mL | PREFILLED_SYRINGE | Freq: Once | INTRAMUSCULAR | 0 refills | Status: AC
  Filled 2023-10-11: qty 0.3, 1d supply, fill #0

## 2023-10-11 MED ORDER — FLUZONE HIGH-DOSE 0.5 ML IM SUSY
0.5000 mL | PREFILLED_SYRINGE | Freq: Once | INTRAMUSCULAR | 0 refills | Status: AC
Start: 2023-10-11 — End: 2023-10-12
  Filled 2023-10-11: qty 0.5, 1d supply, fill #0

## 2023-10-16 ENCOUNTER — Ambulatory Visit (INDEPENDENT_AMBULATORY_CARE_PROVIDER_SITE_OTHER): Admitting: Family Medicine

## 2023-10-16 ENCOUNTER — Encounter (INDEPENDENT_AMBULATORY_CARE_PROVIDER_SITE_OTHER): Payer: Self-pay | Admitting: Family Medicine

## 2023-10-16 ENCOUNTER — Other Ambulatory Visit (HOSPITAL_COMMUNITY): Payer: Self-pay

## 2023-10-16 VITALS — BP 126/74 | HR 62 | Temp 98.0°F | Ht 68.0 in | Wt 280.0 lb

## 2023-10-16 DIAGNOSIS — Z6841 Body Mass Index (BMI) 40.0 and over, adult: Secondary | ICD-10-CM

## 2023-10-16 DIAGNOSIS — Z7984 Long term (current) use of oral hypoglycemic drugs: Secondary | ICD-10-CM

## 2023-10-16 DIAGNOSIS — E669 Obesity, unspecified: Secondary | ICD-10-CM

## 2023-10-16 DIAGNOSIS — Z7985 Long-term (current) use of injectable non-insulin antidiabetic drugs: Secondary | ICD-10-CM

## 2023-10-16 DIAGNOSIS — G4733 Obstructive sleep apnea (adult) (pediatric): Secondary | ICD-10-CM

## 2023-10-16 DIAGNOSIS — R7303 Prediabetes: Secondary | ICD-10-CM

## 2023-10-16 DIAGNOSIS — E1122 Type 2 diabetes mellitus with diabetic chronic kidney disease: Secondary | ICD-10-CM

## 2023-10-16 DIAGNOSIS — E860 Dehydration: Secondary | ICD-10-CM | POA: Diagnosis not present

## 2023-10-16 DIAGNOSIS — N1831 Chronic kidney disease, stage 3a: Secondary | ICD-10-CM | POA: Diagnosis not present

## 2023-10-16 MED ORDER — METFORMIN HCL 500 MG PO TABS
500.0000 mg | ORAL_TABLET | Freq: Every day | ORAL | 1 refills | Status: DC
Start: 2023-10-16 — End: 2023-12-04
  Filled 2023-10-16: qty 90, 90d supply, fill #0

## 2023-10-16 NOTE — Progress Notes (Signed)
 Office: [phone]  /  Fax: [phone]  WEIGHT SUMMARY AND BIOMETRICS  Anthropometric Measurements Height: 5' 8 (1.727 m) Weight: 280 lb (127 kg) BMI (Calculated): 42.58 Weight at Last Visit: 288 lb Weight Lost Since Last Visit: 8 lb Weight Gained Since Last Visit: 0 Starting Weight: 317 lb Total Weight Loss (lbs): 37 lb (16.8 kg) Peak Weight: 320 lb   Body Composition  Body Fat %: 40.2 % Fat Mass (lbs): 112.8 lbs Muscle Mass (lbs): 59.8 lbs Total Body Water (lbs): 125.2 lbs Visceral Fat Rating : 28   Other Clinical Data Fasting: no Labs: no Today's Visit #: 31 Starting Date: 02/02/21    Chief Complaint: OBESITY   History of Present Illness Keith Brown Better, 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  He is experiencing success with weight loss, having lost eight pounds in the last month. He adheres to the category three eating plan about fifty percent of the time, focusing on increasing protein intake, consuming more fruits and vegetables, and hydrating, although he still skips meals. He engages in physical activity by walking three to four days a week for thirty minutes each session.  He has type 2 diabetes and is currently on Mounjaro  7.5 mg. He experiences symptoms of hypoglycemia, described as a 'hot week shaky feel', particularly if he skips breakfast, necessitating him to sit down. These symptoms are less frequent when he consumes a substantial breakfast. He typically skips lunch but ensures a healthy dinner.  He has chronic renal disease and is working on increasing hydration. He has been drinking more water, especially during activities like golf.  He does not consistently achieve seven to nine hours of sleep per night, often going to bed late and waking up early. He uses a dental appliance for sleep and has been informed that he snores less. He feels rested when he does sleep, although he sometimes  experiences significant fatigue.  He is currently managing stress related to a legal battle. He tends to snack late at night, especially when staying up late.      PHYSICAL EXAM:  Blood pressure 126/74, pulse 62, temperature 98 F (36.7 C), height 5' 8 (1.727 m), weight 280 lb (127 kg), SpO2 98%. Body mass index is 42.57 kg/m.  DIAGNOSTIC DATA REVIEWED:  BMET    Component Value Date/Time   NA 137 09/06/2023 0803   K 4.8 09/06/2023 0803   CL 105 09/06/2023 0803   CO2 17 (L) 09/06/2023 0803   GLUCOSE 94 09/06/2023 0803   GLUCOSE 105 (H) 11/09/2022 0914   BUN 37 (H) 09/06/2023 0803   CREATININE 1.46 (H) 09/06/2023 0803   CREATININE 0.99 06/05/2018 1352   CALCIUM  9.6 09/06/2023 0803   GFRNONAA 43 (L) 02/16/2016 1839   GFRAA 49 (L) 02/16/2016 1839   Lab Results  Component Value Date   HGBA1C 5.4 08/09/2023   HGBA1C 5.6 01/23/2017   Lab Results  Component Value Date   INSULIN  17.7 08/09/2023   INSULIN  20.6 02/02/2021   Lab Results  Component Value Date   TSH 1.790 08/09/2023   CBC    Component Value Date/Time   WBC 6.8 11/09/2022 0914   RBC 5.18 11/09/2022 0914   HGB 15.1 11/09/2022 0914   HGB 15.7 07/25/2021 0742   HCT 46.9 11/09/2022 0914   HCT 46.8 07/25/2021 0742   PLT 143.0 (L) 11/09/2022 0914   PLT 156 07/25/2021 0742   MCV 90.5  11/09/2022 0914   MCV 89 07/25/2021 0742   MCH 29.9 07/25/2021 0742   MCH 29.4 03/31/2020 1510   MCHC 32.1 11/09/2022 0914   RDW 14.4 11/09/2022 0914   RDW 13.1 07/25/2021 0742   Iron Studies    Component Value Date/Time   IRON 108 03/31/2020 1510   TIBC 307 03/31/2020 1510   FERRITIN 451 (H) 03/31/2020 1510   IRONPCTSAT 35 03/31/2020 1510   Lipid Panel     Component Value Date/Time   CHOL 162 08/09/2023 1120   TRIG 78 08/09/2023 1120   HDL 43 08/09/2023 1120   CHOLHDL 3.1 01/24/2023 1436   CHOLHDL 3 11/09/2022 0914   VLDL 11.6 11/09/2022 0914   LDLCALC 104 (H) 08/09/2023 1120   LDLCALC 104 (H) 06/05/2018 1352    Hepatic Function Panel     Component Value Date/Time   PROT 6.6 09/06/2023 0803   ALBUMIN  4.2 09/06/2023 0803   AST 22 09/06/2023 0803   ALT 18 09/06/2023 0803   ALKPHOS 75 09/06/2023 0803   BILITOT 0.4 09/06/2023 0803   BILIDIR 0.22 01/24/2023 1436      Component Value Date/Time   TSH 1.790 08/09/2023 1120   Nutritional Lab Results  Component Value Date   VD25OH 34.4 08/09/2023   VD25OH 39.95 11/09/2022   VD25OH 59.5 07/25/2021     Assessment and Plan Assessment & Plan Obesity Ongoing management with recent weight loss of eight pounds in the last month. He follows the category three eating plan about fifty percent of the time, increases protein intake, and consumes more fruits and vegetables. However, he skips meals and does not consistently achieve seven to nine hours of sleep per night. He engages in physical activity by walking three to four days a week for thirty minutes each time. - Continue category three eating plan - Encourage consistent meal intake and adequate sleep - Maintain physical activity with regular walking  Type 2 diabetes mellitus Managed with Mounjaro  7.5 mg. Experiences hypoglycemic symptoms if breakfast is skipped, indicating the importance of regular meals. Advised to carry snacks like almonds to prevent hypoglycemia during early activities. Blood sugar control is well-managed with current medication and lifestyle adjustments. - Continue Mounjaro  7.5 mg - Refill metformin  prescription - Advise carrying small protein snacks like 100 calorie packs of almonds while golfing to prevent hypoglycemia  Chronic kidney disease, stage 3a with dehydration Chronic kidney disease stage 3a with persistent dehydration. Recent labs show improvement in GFR. Increasing hydration is crucial for kidney function improvement. - Encourage increased hydration - Will recheck labs in 3 months    Brysten was informed of the importance of frequent follow up visits to  maximize his success with intensive lifestyle modifications for his obesity and obesity related health conditions as recommended by USPSTF and CMS guidelines   Louann Penton, MD

## 2023-11-12 ENCOUNTER — Encounter: Payer: Self-pay | Admitting: Family Medicine

## 2023-11-12 ENCOUNTER — Ambulatory Visit: Payer: BLUE CROSS/BLUE SHIELD | Admitting: Family Medicine

## 2023-11-12 ENCOUNTER — Other Ambulatory Visit (HOSPITAL_COMMUNITY): Payer: Self-pay

## 2023-11-12 ENCOUNTER — Ambulatory Visit

## 2023-11-12 VITALS — BP 120/74 | HR 67 | Temp 98.4°F | Ht 68.0 in | Wt 284.2 lb

## 2023-11-12 DIAGNOSIS — M79642 Pain in left hand: Secondary | ICD-10-CM

## 2023-11-12 DIAGNOSIS — E559 Vitamin D deficiency, unspecified: Secondary | ICD-10-CM | POA: Diagnosis not present

## 2023-11-12 DIAGNOSIS — E785 Hyperlipidemia, unspecified: Secondary | ICD-10-CM | POA: Diagnosis not present

## 2023-11-12 DIAGNOSIS — R7303 Prediabetes: Secondary | ICD-10-CM

## 2023-11-12 DIAGNOSIS — F4320 Adjustment disorder, unspecified: Secondary | ICD-10-CM | POA: Insufficient documentation

## 2023-11-12 DIAGNOSIS — F4329 Adjustment disorder with other symptoms: Secondary | ICD-10-CM

## 2023-11-12 DIAGNOSIS — M79645 Pain in left finger(s): Secondary | ICD-10-CM | POA: Diagnosis not present

## 2023-11-12 DIAGNOSIS — N400 Enlarged prostate without lower urinary tract symptoms: Secondary | ICD-10-CM | POA: Diagnosis not present

## 2023-11-12 DIAGNOSIS — M25551 Pain in right hip: Secondary | ICD-10-CM

## 2023-11-12 DIAGNOSIS — I10 Essential (primary) hypertension: Secondary | ICD-10-CM

## 2023-11-12 DIAGNOSIS — N529 Male erectile dysfunction, unspecified: Secondary | ICD-10-CM

## 2023-11-12 DIAGNOSIS — M109 Gout, unspecified: Secondary | ICD-10-CM | POA: Diagnosis not present

## 2023-11-12 DIAGNOSIS — E790 Hyperuricemia without signs of inflammatory arthritis and tophaceous disease: Secondary | ICD-10-CM

## 2023-11-12 LAB — COMPREHENSIVE METABOLIC PANEL WITH GFR
ALT: 14 U/L (ref 0–53)
AST: 16 U/L (ref 0–37)
Albumin: 4.4 g/dL (ref 3.5–5.2)
Alkaline Phosphatase: 64 U/L (ref 39–117)
BUN: 35 mg/dL — ABNORMAL HIGH (ref 6–23)
CO2: 23 meq/L (ref 19–32)
Calcium: 9.5 mg/dL (ref 8.4–10.5)
Chloride: 106 meq/L (ref 96–112)
Creatinine, Ser: 1.32 mg/dL (ref 0.40–1.50)
GFR: 55.54 mL/min — ABNORMAL LOW (ref >60.00)
Glucose, Bld: 89 mg/dL (ref 70–99)
Potassium: 4.9 meq/L (ref 3.5–5.1)
Sodium: 136 meq/L (ref 135–145)
Total Bilirubin: 0.5 mg/dL (ref 0.2–1.2)
Total Protein: 6.8 g/dL (ref 6.0–8.3)

## 2023-11-12 LAB — CBC
HCT: 38.3 % — ABNORMAL LOW (ref 39.0–52.0)
Hemoglobin: 12.6 g/dL — ABNORMAL LOW (ref 13.0–17.0)
MCHC: 32.8 g/dL (ref 30.0–36.0)
MCV: 89 fl (ref 78.0–100.0)
Platelets: 176 K/uL (ref 150.0–400.0)
RBC: 4.31 Mil/uL (ref 4.22–5.81)
RDW: 14.8 % (ref 11.5–15.5)
WBC: 8.5 K/uL (ref 4.0–10.5)

## 2023-11-12 LAB — PSA: PSA: 2.54 ng/mL (ref 0.10–4.00)

## 2023-11-12 LAB — LIPID PANEL
Cholesterol: 147 mg/dL (ref 0–200)
HDL: 46.4 mg/dL (ref >39.00)
LDL Cholesterol: 84 mg/dL (ref 0–99)
NonHDL: 101.02
Total CHOL/HDL Ratio: 3
Triglycerides: 85 mg/dL (ref 0.0–149.0)
VLDL: 17 mg/dL (ref 0.0–40.0)

## 2023-11-12 LAB — TSH: TSH: 2.46 u[IU]/mL (ref 0.35–5.50)

## 2023-11-12 LAB — VITAMIN D 25 HYDROXY (VIT D DEFICIENCY, FRACTURES): VITD: 50.58 ng/mL (ref 30.00–100.00)

## 2023-11-12 LAB — URIC ACID: Uric Acid, Serum: 4.7 mg/dL (ref 4.0–7.8)

## 2023-11-12 LAB — HEMOGLOBIN A1C: Hgb A1c MFr Bld: 5.6 % (ref 4.6–6.5)

## 2023-11-12 MED ORDER — ALLOPURINOL 300 MG PO TABS
300.0000 mg | ORAL_TABLET | Freq: Every day | ORAL | 3 refills | Status: AC
  Filled 2023-11-12: qty 90, 90d supply, fill #0
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
## 2023-11-12 NOTE — Assessment & Plan Note (Signed)
 check PSA.  He is on tadalafil  10 mg every other day
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
## 2023-11-12 NOTE — Assessment & Plan Note (Signed)
 Check vitamin D.

## 2023-11-12 NOTE — Assessment & Plan Note (Signed)
 Check A1c. Following with [facility]. Check A1c with labs.  He is on metformin  and Mounjaro .

## 2023-11-12 NOTE — Assessment & Plan Note (Signed)
 Patient has been under more stress due to some legal issues.  Overall feels like things are manageable.  We did discuss treatment options however he is not interested at this point.  He will let us  know if he needs any further assistance.

## 2023-11-12 NOTE — Assessment & Plan Note (Signed)
Stable on tadalafil as needed

## 2023-11-12 NOTE — Assessment & Plan Note (Signed)
 Check uric acid. Continue allopurinol 300 mg daily.

## 2023-11-12 NOTE — Assessment & Plan Note (Signed)
 Check lipids

## 2023-11-12 NOTE — Assessment & Plan Note (Signed)
 BP at goal today on amlodipine  10 mg, spironolactone  25 mg daily, and valsartan -hydrochloroTHIAZIDE  320--25 daily. Check labs.

## 2023-11-13 ENCOUNTER — Ambulatory Visit

## 2023-11-13 ENCOUNTER — Ambulatory Visit: Payer: Self-pay | Admitting: Family Medicine

## 2023-11-13 DIAGNOSIS — D649 Anemia, unspecified: Secondary | ICD-10-CM

## 2023-11-13 DIAGNOSIS — M79642 Pain in left hand: Secondary | ICD-10-CM

## 2023-11-13 NOTE — Progress Notes (Signed)
 His BUN is up a little bit.  This usually indicates dehydration.  He should make sure that he is pushing fluids and we can recheck this again in a few weeks or he can have it rechecked with his next lab draw with medical weight management.  His hemoglobin has dropped quite a bit since last year.  Please have him come back to recheck CBC with differential, iron panel, folate, and B12.  All of his other labs are at goal and we can recheck in a year

## 2023-11-13 NOTE — Progress Notes (Signed)
 His x-ray shows a small fracture at the base of his little finger.  This should heal with buddy taping.    His thumb x-ray shows moderate degenerative changes and TFCC calcification.   We can have him come back here in a couple of weeks for x-ray on his little finger or we can have him follow-up with a hand specialist if he prefers that.

## 2023-11-13 NOTE — Progress Notes (Signed)
 His hip x-ray does not show any fracture, dislocation, or hardware issues.  As we discussed at his visit he may have some bursitis to the area.  We can try course of anti-inflammatories if he wishes or we can have him follow back up with his surgeon if he prefers this.

## 2023-11-14 ENCOUNTER — Other Ambulatory Visit: Payer: Self-pay | Admitting: Family Medicine

## 2023-11-14 ENCOUNTER — Ambulatory Visit (INDEPENDENT_AMBULATORY_CARE_PROVIDER_SITE_OTHER): Admitting: Family Medicine

## 2023-11-14 DIAGNOSIS — M79642 Pain in left hand: Secondary | ICD-10-CM

## 2023-11-16 ENCOUNTER — Telehealth: Payer: Self-pay

## 2023-11-16 NOTE — Telephone Encounter (Signed)
 Xrays on EPIC of right hip, can you take a look at these, having hip pain

## 2023-11-19 ENCOUNTER — Encounter: Payer: Self-pay | Admitting: Radiology

## 2023-12-03 ENCOUNTER — Ambulatory Visit (INDEPENDENT_AMBULATORY_CARE_PROVIDER_SITE_OTHER): Payer: BLUE CROSS/BLUE SHIELD

## 2023-12-03 VITALS — BP 136/68 | HR 59 | Temp 97.2°F | Ht 69.0 in | Wt 274.0 lb

## 2023-12-03 DIAGNOSIS — Z0000 Encounter for general adult medical examination without abnormal findings: Secondary | ICD-10-CM

## 2023-12-03 NOTE — Progress Notes (Signed)
 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  Allergies (verified) Patient has no known allergies.   History: Past Medical History:  Diagnosis Date   Acute meniscal tear of left knee    Allergy    fire ants   Back pain    Complication of anesthesia    Coronary artery disease    Edema of both lower extremities    Fatigue    History of kidney stones    Hyperlipidemia    Hypertension    Joint pain    Osteoarthritis    PONV (postoperative nausea and vomiting)    Sleep apnea    SOB (shortness of breath) on exertion    Vitamin D  deficiency    Past Surgical History:  Procedure Laterality Date   BACK SURGERY     laminectomy L5-S1   COLONOSCOPY WITH PROPOFOL  N/A 03/14/2017   Procedure: COLONOSCOPY WITH PROPOFOL ;  Surgeon: Rosalie Kitchens, MD;  Location: [facility];  Service: Endoscopy;  Laterality: N/A;   Colonscopy     polyps   CYSTOSCOPY KIDNEY W/ URETERAL GUIDE WIRE  2010   lithotripsy   CYSTOSCOPY/RETROGRADE/URETEROSCOPY/STONE EXTRACTION WITH BASKET Right 02/16/2016   Procedure: CYSTOSCOPY/RETROGRADE/RIGHT FLEXIBLE URETEROSCOPY/STONE EXTRACTION WITH BASKET;  Surgeon: Gretel Ferrara, MD;  Location: [facility];  Service: Urology;  Laterality: Right;   HOLMIUM LASER APPLICATION Left 02/16/2016   Procedure: HOLMIUM LASER APPLICATION;  Surgeon: Gretel Ferrara, MD;  Location: [facility];  Service: Urology;  Laterality: Left;   KNEE ARTHROSCOPY Right 2013   KNEE ARTHROSCOPY WITH MEDIAL MENISECTOMY Left 03/12/2015   Procedure: LEFT KNEE ARTHROSCOPY WITH PARTIAL MEDIAL MENISCECTOMY;  Surgeon: Lonni CINDERELLA Poli, MD;  Location: [facility];  Service: Orthopedics;  Laterality: Left;   LAMINECTOMY  1999   MR HIPS BILATERAL     2009&2010   TOTAL HIP REVISION  09/15/2011   Procedure: TOTAL HIP REVISION;  Surgeon: Lonni CINDERELLA Poli, MD;  Location: [facility];  Service:  Orthopedics;  Laterality: Right;  Revision right hip acetabular component   Family History  Problem Relation Age of Onset   Obesity Mother    Hypertension Mother    Diabetes Mother    Stroke Mother    Kidney failure Mother    Heart disease Mother    Obesity Father    Hypertension Father    Colon cancer Father    Cancer Father    Anemia Father    Diabetes Father    Skin cancer Father    Heart attack Sister    Obesity Sister    Prostatitis Brother    Heart failure Brother    Sleep apnea Brother    Obesity Brother    Obesity Brother    Obesity Brother    Obesity Brother    Prostate cancer Neg Hx    Social History   Occupational History   Not on file  Tobacco Use   Smoking status: Never   Smokeless tobacco: Never  Vaping Use   Vaping status: Never Used  Substance and Sexual Activity   Alcohol use: Yes    Alcohol/week: 7.0 standard drinks of alcohol    Types: 7 Glasses of wine per week    Comment: glass of wine per night   Drug use: No   Sexual activity: Not on file   Tobacco Counseling Counseling given: Not Answered  SDOH Screenings  Food Insecurity: No Food Insecurity (12/03/2023)  Housing: Unknown (12/03/2023)  Transportation Needs: No Transportation Needs (12/03/2023)  Utilities: Not At Risk (12/03/2023)  Alcohol Screen: Low Risk  (12/01/2022)  Depression (PHQ2-9): Low Risk  (12/03/2023)  Financial Resource Strain: Low Risk  (12/01/2022)  Physical Activity: Unknown (12/03/2023)  Social Connections: Moderately Isolated (12/03/2023)  Stress: Stress Concern Present (12/03/2023)  Tobacco Use: Low Risk  (12/03/2023)  Health Literacy: Adequate Health Literacy (12/03/2023)   See flowsheets for full screening details  Depression Screen PHQ 2 & 9 Depression Scale- Over the past 2 weeks, how often have you been bothered by any of the following problems? Little interest or pleasure in doing things: 0 Feeling down, depressed, or hopeless (PHQ Adolescent also  includes...irritable): 0 PHQ-2 Total Score: 0     Goals Addressed               This Visit's Progress     weight down (pt-stated)        Weight down        Visit info / Clinical Intake: Medicare Wellness Visit Type:: Subsequent Annual Wellness Visit Persons participating in visit:: patient Medicare Wellness Visit Mode:: In-person (required for WTM) Information given by:: patient Interpreter Needed?: No Pre-visit prep was completed: yes AWV questionnaire completed by patient prior to visit?: no Living arrangements:: lives with spouse/significant other Patient's Overall Health Status Rating: good Typical amount of pain: some Does pain affect daily life?: (!) yes (hip at times) Are you currently prescribed opioids?: no  Dietary Habits and Nutritional Risks How many meals a day?: 2 Eats fruit and vegetables daily?: yes Most meals are obtained by: preparing own meals Diabetic:: no  Functional Status Activities of Daily Living (to include ambulation/medication): Independent Ambulation: Independent with device- listed below Home Assistive Devices/Equipment: Eyeglasses (dental retainer) Medication Administration: Independent Home Management: Independent Manage your own finances?: yes Primary transportation is: driving Concerns about vision?: no *vision screening is required for WTM* Concerns about hearing?: no  Fall Screening Falls in the past year?: 0 Number of falls in past year: 0 Was there an injury with Fall?: 0 Fall Risk Category Calculator: 0 Patient Fall Risk Level: Low Fall Risk  Fall Risk Patient at Risk for Falls Due to: No Fall Risks Fall risk Follow up: Falls prevention discussed  Home and Transportation Safety: All rugs have non-skid backing?: yes All stairs or steps have railings?: yes Grab bars in the bathtub or shower?: (!) no Have non-skid surface in bathtub or shower?: yes Good home lighting?: yes Regular seat belt use?: yes Hospital stays  in the last year:: no  Cognitive Assessment Difficulty concentrating, remembering, or making decisions? : no Will 6CIT or Mini Cog be Completed: no 6CIT or Mini Cog Declined: patient alert, oriented, able to answer questions appropriately and recall recent events  Advance Directives (For Healthcare) Does Patient Have a Medical Advance Directive?: No Would patient like information on creating a medical advance directive?: No - Patient declined  Reviewed/Updated  Reviewed/Updated: Reviewed All (Medical, Surgical, Family, Medications, Allergies, Care Teams, Patient Goals)        Objective:    Today's Vitals   12/03/23 0914  BP: 136/68  Pulse: (!) 59  Temp: (!) 97.2 F (36.2 C)  SpO2: 93%  Weight: 274 lb (124.3 kg)  Height: 5' 9 (1.753 m)   Body mass index is 40.46 kg/m.  Current Medications (verified) Outpatient Encounter Medications as of 12/03/2023  Medication Sig   allopurinol  (ZYLOPRIM ) 300 MG tablet Take 1  tablet (300 mg total) by mouth daily.   amLODipine  (NORVASC ) 10 MG tablet Take 1 tablet (10 mg total) by mouth daily.   Cholecalciferol (VITAMIN D3) 50 MCG (2000 UT) TABS Take by mouth daily.   hydrALAZINE  (APRESOLINE ) 25 MG tablet Take 1 tablet (25 mg total) by mouth 2 (two) times daily as needed. For systolic blood pressure 180 or more.   ketoconazole  2%-triamcinolone  0.1% 1:2 cream mixture Apply topically daily as needed   Loratadine 10 MG CAPS Take 10 mg by mouth at bedtime.    metFORMIN  (GLUCOPHAGE ) 500 MG tablet Take 1 tablet (500 mg total) by mouth daily with breakfast.   rosuvastatin  (CRESTOR ) 10 MG tablet Take 1 tablet (10 mg total) by mouth 3 (three) times a week.   spironolactone  (ALDACTONE ) 25 MG tablet Take 1 tablet (25 mg total) by mouth daily.   tadalafil  (CIALIS ) 10 MG tablet Take 1 tablet (10 mg total) by mouth every other day as needed for erectile dysfunction.   terbinafine  (LAMISIL ) 250 MG tablet Take 1 tablet (250 mg total) by mouth daily.    tirzepatide  (MOUNJARO ) 7.5 MG/0.5ML Pen Inject 7.5 mg into the skin once a week.   triamcinolone  cream (KENALOG ) 0.1 % Apply 1 Application topically 2 (two) times daily as needed. Apply topically after the shower daily.   valsartan -hydrochlorothiazide  (DIOVAN -HCT) 320-25 MG tablet Take 1 tablet by mouth daily.   No facility-administered encounter medications on file as of 12/03/2023.   Hearing/Vision screen Hearing Screening - Comments:: Pt denies any hearing issues  Vision Screening - Comments:: Wears rx glasses - up to date with routine eye exams with [facility]   Immunizations and Health Maintenance Health Maintenance  Topic Date Due   Zoster Vaccines- Shingrix  (2 of 2) 02/12/2024 (Originally 10/24/2021)   COVID-19 Vaccine (6 - Pfizer risk 2025-26 season) 04/09/2024   Medicare Annual Wellness (AWV)  12/02/2024   Colonoscopy  12/27/2027   DTaP/Tdap/Td (2 - Td or Tdap) 08/30/2031   Pneumococcal Vaccine: 50+ Years  Completed   Influenza Vaccine  Completed   Hepatitis C Screening  Completed   Meningococcal B Vaccine  Aged Out        Assessment/Plan:  This is a routine wellness examination for Nikolay.  Patient Care Team: Kennyth Worth HERO, MD as PCP - General (Family Medicine) Vannie Reche RAMAN, NP as Nurse Practitioner (Cardiology)  I have personally reviewed and noted the following in the patient's chart:   Medical and social history Use of alcohol, tobacco or illicit drugs  Current medications and supplements including opioid prescriptions. Functional ability and status Nutritional status Physical activity Advanced directives List of other physicians Hospitalizations, surgeries, and ER visits in previous 12 months Vitals Screenings to include cognitive, depression, and falls Referrals and appointments  No orders of the defined types were placed in this encounter.  In addition, I have reviewed and discussed with patient certain preventive protocols, quality  metrics, and best practice recommendations. A written personalized care plan for preventive services as well as general preventive health recommendations were provided to patient.   Ellouise VEAR Haws, LPN   [id number]   Return 1 year 12/09/24 @ 8:40 a.m   After Visit Summary: (In Person-Printed) AVS printed and given to the patient  Nurse Notes: nothing significant at this time

## 2023-12-03 NOTE — Patient Instructions (Signed)
 Mr. Keith Brown,  Thank you for taking the time for your Medicare Wellness Visit. I appreciate your continued commitment to your health goals. Please review the care plan we discussed, and feel free to reach out if I can assist you further.  Please note that Annual Wellness Visits do not include a physical exam. Some assessments may be limited, especially if the visit was conducted virtually. If needed, we may recommend an in-person follow-up with your provider.  Ongoing Care Seeing your primary care provider every 3 to 6 months helps us  monitor your health and provide consistent, personalized care.   Referrals If a referral was made during today's visit and you haven't received any updates within two weeks, please contact the referred provider directly to check on the status.  Recommended Screenings:  Health Maintenance  Topic Date Due   Zoster (Shingles) Vaccine (2 of 2) 02/12/2024*   COVID-19 Vaccine (6 - Pfizer risk 2025-26 season) 04/09/2024   Medicare Annual Wellness Visit  12/02/2024   Colon Cancer Screening  12/27/2027   DTaP/Tdap/Td vaccine (2 - Td or Tdap) 08/30/2031   Pneumococcal Vaccine for age over 36  Completed   Flu Shot  Completed   Hepatitis C Screening  Completed   Meningitis B Vaccine  Aged Out  *Topic was postponed. The date shown is not the original due date.       12/01/2022   10:31 AM  Advanced Directives  Does Patient Have a Medical Advance Directive? No  Would patient like information on creating a medical advance directive? No - Patient declined    Vision: Annual vision screenings are recommended for early detection of glaucoma, cataracts, and diabetic retinopathy. These exams can also reveal signs of chronic conditions such as diabetes and high blood pressure.  Dental: Annual dental screenings help detect early signs of oral cancer, gum disease, and other conditions linked to overall health, including heart disease and diabetes.  Please see the attached  documents for additional preventive care recommendations.

## 2023-12-04 ENCOUNTER — Encounter: Payer: Self-pay | Admitting: Dermatology

## 2023-12-04 ENCOUNTER — Other Ambulatory Visit (HOSPITAL_COMMUNITY): Payer: Self-pay

## 2023-12-04 ENCOUNTER — Ambulatory Visit: Admitting: Dermatology

## 2023-12-04 ENCOUNTER — Other Ambulatory Visit: Payer: Self-pay | Admitting: Medical Genetics

## 2023-12-04 ENCOUNTER — Ambulatory Visit (INDEPENDENT_AMBULATORY_CARE_PROVIDER_SITE_OTHER): Payer: Self-pay | Admitting: Family Medicine

## 2023-12-04 VITALS — BP 146/83 | HR 53 | Temp 98.1°F | Ht 68.0 in | Wt 271.0 lb

## 2023-12-04 DIAGNOSIS — I129 Hypertensive chronic kidney disease with stage 1 through stage 4 chronic kidney disease, or unspecified chronic kidney disease: Secondary | ICD-10-CM

## 2023-12-04 DIAGNOSIS — E669 Obesity, unspecified: Secondary | ICD-10-CM | POA: Diagnosis not present

## 2023-12-04 DIAGNOSIS — B351 Tinea unguium: Secondary | ICD-10-CM

## 2023-12-04 DIAGNOSIS — Z6841 Body Mass Index (BMI) 40.0 and over, adult: Secondary | ICD-10-CM

## 2023-12-04 DIAGNOSIS — Z79899 Other long term (current) drug therapy: Secondary | ICD-10-CM | POA: Diagnosis not present

## 2023-12-04 DIAGNOSIS — I10 Essential (primary) hypertension: Secondary | ICD-10-CM

## 2023-12-04 DIAGNOSIS — L84 Corns and callosities: Secondary | ICD-10-CM

## 2023-12-04 DIAGNOSIS — R7303 Prediabetes: Secondary | ICD-10-CM

## 2023-12-04 DIAGNOSIS — N1831 Chronic kidney disease, stage 3a: Secondary | ICD-10-CM | POA: Diagnosis not present

## 2023-12-04 MED ORDER — METFORMIN HCL 500 MG PO TABS
500.0000 mg | ORAL_TABLET | Freq: Every day | ORAL | 1 refills | Status: AC
  Filled 2023-12-04 – 2024-01-11 (×2): qty 90, 90d supply, fill #0

## 2023-12-04 MED ORDER — TIRZEPATIDE 7.5 MG/0.5ML ~~LOC~~ SOAJ
7.5000 mg | SUBCUTANEOUS | 0 refills | Status: DC
  Filled 2023-12-04: qty 6, 84d supply, fill #0

## 2023-12-04 MED ORDER — TERBINAFINE HCL 250 MG PO TABS
250.0000 mg | ORAL_TABLET | Freq: Every day | ORAL | 0 refills | Status: AC
  Filled 2023-12-04: qty 90, 90d supply, fill #0

## 2023-12-04 NOTE — Patient Instructions (Signed)

## 2023-12-04 NOTE — Progress Notes (Signed)
   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 Last seen 09/25/2023, and still on 250 mg daily of terbinafine . He has noticed normal nail growth. He would like to continue for another 3 months. States that he had normal AST/ALT last month.   The following portions of the chart were reviewed this encounter and updated as appropriate: medications, allergies, medical history  Review of Systems:  No other skin or systemic complaints except as noted in HPI or Assessment and Plan.  Objective  Well appearing patient in no apparent distress; mood and affect are within normal limits.  A focused examination was performed of the following areas: Bilateral feet     Relevant exam findings are noted in the Assessment and Plan.  Left Palmar Proximal Thumb Hyperkeratotic papule  Assessment & Plan   ONYCHOMYCOSIS- improving Exam: Thickened toenails with subungal debris c/w onychomycosis  Chronic and persistent condition with duration or expected duration over one year. Condition is symptomatic/ bothersome to patient. Not currently at goal.  Treatment Plan: Continue 250 mg daily for another 90 days  High Risk Medication Use- Terbinafine  The patient was counseled on the use of high-risk medication terbinafine  250 mg daily. We reviewed the importance of taking the medication as prescribed and completing the full treatment course. Potential adverse effects were discussed, including gastrointestinal upset, headaches, taste disturbances, rash, and, more rarely, hepatotoxicity. The patient was advised to report any symptoms concerning for liver injury such as dark urine, jaundice, right-upper-quadrant abdominal pain, persistent nausea, or unexplained fatigue. We reviewed the need to avoid alcohol or limit use during therapy due to increased risk of liver stress. The patient was reminded to inform the clinic of any new medications to assess  for interactions. Baseline and interval liver function testing were discussed per current clinical guidelines. The patient verbalized understanding and agreed to proceed with therapy.  Callosities- Left Hand Will continue to monitor Will cryotherapy today   ONYCHOMYCOSIS   Related Medications terbinafine  (LAMISIL ) 250 MG tablet Take 1 tablet (250 mg total) by mouth daily. CORNS AND CALLOSITIES Left Palmar Proximal Thumb Destruction of lesion - Left Palmar Proximal Thumb Complexity: simple   Destruction method: cryotherapy   Outcome: patient tolerated procedure well with no complications     Return in about 3 months (around 03/05/2024).  I, Darice Smock, CMA, am acting as scribe for Keith CHRISTELLA HOLY, MD.   Documentation: I have reviewed the above documentation for accuracy and completeness, and I agree with the above.  Keith CHRISTELLA HOLY, MD
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
## 2023-12-05 ENCOUNTER — Encounter: Payer: Self-pay | Admitting: Family Medicine

## 2023-12-05 ENCOUNTER — Ambulatory Visit

## 2023-12-05 ENCOUNTER — Ambulatory Visit (INDEPENDENT_AMBULATORY_CARE_PROVIDER_SITE_OTHER): Admitting: Family Medicine

## 2023-12-05 VITALS — BP 104/64 | HR 60 | Temp 97.3°F | Resp 16 | Ht 69.0 in | Wt 278.0 lb

## 2023-12-05 DIAGNOSIS — F4329 Adjustment disorder with other symptoms: Secondary | ICD-10-CM

## 2023-12-05 DIAGNOSIS — S62647D Nondisplaced fracture of proximal phalanx of left little finger, subsequent encounter for fracture with routine healing: Secondary | ICD-10-CM | POA: Diagnosis not present

## 2023-12-05 DIAGNOSIS — I10 Essential (primary) hypertension: Secondary | ICD-10-CM

## 2023-12-05 DIAGNOSIS — D649 Anemia, unspecified: Secondary | ICD-10-CM | POA: Diagnosis not present

## 2023-12-05 LAB — CBC WITH DIFFERENTIAL/PLATELET
Basophils Absolute: 0 K/uL (ref 0.0–0.1)
Basophils Relative: 0.4 % (ref 0.0–3.0)
Eosinophils Absolute: 0.2 K/uL (ref 0.0–0.7)
Eosinophils Relative: 2.1 % (ref 0.0–5.0)
HCT: 39.6 % (ref 39.0–52.0)
Hemoglobin: 13.2 g/dL (ref 13.0–17.0)
Lymphocytes Relative: 18.2 % (ref 12.0–46.0)
Lymphs Abs: 1.3 K/uL (ref 0.7–4.0)
MCHC: 33.2 g/dL (ref 30.0–36.0)
MCV: 90.3 fl (ref 78.0–100.0)
Monocytes Absolute: 0.7 K/uL (ref 0.1–1.0)
Monocytes Relative: 9.6 % (ref 3.0–12.0)
Neutro Abs: 5.1 K/uL (ref 1.4–7.7)
Neutrophils Relative %: 69.7 % (ref 43.0–77.0)
Platelets: 174 K/uL (ref 150.0–400.0)
RBC: 4.39 Mil/uL (ref 4.22–5.81)
RDW: 14.8 % (ref 11.5–15.5)
WBC: 7.3 K/uL (ref 4.0–10.5)

## 2023-12-05 LAB — VITAMIN B12: Vitamin B-12: 279 pg/mL (ref 211–911)

## 2023-12-05 LAB — FOLATE: Folate: 10 ng/mL (ref >5.9)

## 2023-12-05 NOTE — Progress Notes (Signed)
   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  Assessment/Plan:  New/Acute Problems: Finger Pain Pain has significantly improved.  We did repeat x-ray today though on our read there was no obvious fracture or deformation.  Given that pain has continued to improve do not think we need to do any further follow-ups at this point.  He will let us  know if he has any recurrence of pain or if any new issues arise.  He will follow-up as needed.  Anemia Patient found to have decreased hemoglobin here a few weeks ago.  We are rechecking labs today.  Chronic Problems Addressed Today: Essential hypertension Blood pressure at goal today on current regimen amlodipine  10 mg daily, spironolactone  25 mg daily, and valsartan  HCTZ 320-25 once daily  Adjustment disorder Patient still under quite a bit of stress due to some legal issues.  Overall things are manageable.  He will let us  know if he needs any further assistance with this.     Subjective:  HPI:  See assessment / plan for status of chronic conditions.     Discussed the use of AI scribe software for clinical note transcription with the patient, who gave verbal consent to proceed.  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  He reports improvement in his finger injury with Brown range of motion and reduced pain. He had been using buddy taping but stopped a few days ago. Some stiffness persists, but it is improving. He can perform some lifting activities without significant issues.  He is being evaluated for anemia and mentions a potential link between his medication, mounjaro , and iron metabolism. He experienced a significant hematoma, which might have contributed to a drop in his iron levels. No blood in the stool or dark tarry stools. He consumes iron-rich foods such as prime rib and filet. He has lost 46  pounds on a medium dose of mounjaro .  He has a history of back pain attributed to an old injury from playing racquetball. The pain is localized to the buttock area, with no significant back pain, and he suspects it might be related to sciatica. 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         Objective:  Physical Exam: BP 104/64   Pulse 60   Temp (!) 97.3 F (36.3 C) (Temporal)   Resp 16   Ht 5' 9 (1.753 m)   Wt 278 lb (126.1 kg)   SpO2 99%   BMI 41.05 kg/m    Gen: No acute distress, resting comfortably MUSCULOSKELETAL: -Left hand: No deformities.  Nontender to palpation.  Full range of motion throughout - Left leg: A few scattered resolving hematomas noted along medial aspect of left lower leg Neuro: Grossly normal, moves all extremities Psych: Normal affect and thought content      Frederica Chrestman M. Kennyth, MD 12/05/2023 10:01 AM

## 2023-12-05 NOTE — Patient Instructions (Signed)
 It was very nice to see you today!  VISIT SUMMARY: You came in for a follow-up on your finger injury and to evaluate your anemia. Your finger is healing well with improved range of motion and reduced pain. We are also investigating the cause of your anemia, which may be related to your medication or a recent hematoma. Additionally, your chronic low back pain with sciatica is improving, and we discussed ongoing stress related to legal issues.  YOUR PLAN: NONDISPLACED FRACTURE OF PROXIMAL PHALANX OF LEFT LITTLE FINGER: Your finger fracture is healing well with improved pain and range of motion. There is slight calcification on the x-ray, but no significant deformity or displacement. -Continue current management as symptoms improve.  ANEMIA: We are evaluating your anemia, which may be related to your medication or a recent hematoma. -Blood tests have been ordered to check iron, B12, and folate levels. -Evaluate blood test results to determine the cause of anemia.  CHRONIC LOW BACK PAIN WITH SCIATICA: Your chronic low back pain with sciatica is improving. The pain is localized to the buttock area and is likely related to sciatica. -Continue to monitor symptoms. -Consider further evaluation if symptoms worsen.  ADJUSTMENT DISORDER: You are experiencing ongoing stress due to legal issues. -Continue current management and support.  Return if symptoms worsen or fail to improve.   Take care, Dr Kennyth  PLEASE NOTE:  If you had any lab tests, please let us  know if you have not heard back within a few days. You may see your results on mychart before we have a chance to review them but we will give you a call once they are reviewed by us .   If we ordered any referrals today, please let us  know if you have not heard from their office within the next week.   If you had any urgent prescriptions sent in today, please check with the pharmacy within an hour of our visit to make sure the prescription was  transmitted appropriately.   Please try these tips to maintain a healthy lifestyle:  Eat at least 3 REAL meals and 1-2 snacks per day.  Aim for no more than 5 hours between eating.  If you eat breakfast, please do so within one hour of getting up.   Each meal should contain half fruits/vegetables, one quarter protein, and one quarter carbs (no bigger than a computer mouse)  Cut down on sweet beverages. This includes juice, soda, and sweet tea.   Drink at least 1 glass of water with each meal and aim for at least 8 glasses per day  Exercise at least 150 minutes every week.

## 2023-12-05 NOTE — Assessment & Plan Note (Signed)
 Blood pressure at goal today on current regimen amlodipine  10 mg daily, spironolactone  25 mg daily, and valsartan  HCTZ 320-25 once daily

## 2023-12-05 NOTE — Assessment & Plan Note (Signed)
 Patient still under quite a bit of stress due to some legal issues.  Overall things are manageable.  He will let us  know if he needs any further assistance with this.

## 2023-12-06 ENCOUNTER — Ambulatory Visit: Payer: Self-pay | Admitting: Family Medicine

## 2023-12-06 LAB — IRON,TIBC AND FERRITIN PANEL
%SAT: 22 % (ref 20–48)
Ferritin: 477 ng/mL — ABNORMAL HIGH (ref 24–380)
Iron: 61 ug/dL (ref 50–180)
TIBC: 274 ug/dL (ref 250–425)

## 2023-12-06 NOTE — Progress Notes (Signed)
 His blood counts are returning back to normal.  It is possible that his decreased blood work from a few weeks ago was due to his injury and development of a hematoma however he does have borderline low B12.  It would be a good idea for him to start B12 supplement 1000 mcg daily and we can recheck everything in 3 months.

## 2023-12-10 ENCOUNTER — Ambulatory Visit (HOSPITAL_BASED_OUTPATIENT_CLINIC_OR_DEPARTMENT_OTHER)

## 2023-12-10 ENCOUNTER — Ambulatory Visit: Payer: Self-pay | Admitting: Family Medicine

## 2023-12-10 NOTE — Progress Notes (Signed)
 Radiology reviewed his xray and saw no abnormalities. He should let us  know if pain does not continue to improve.

## 2023-12-11 NOTE — Progress Notes (Signed)
 Patient has reviewed results and read Dr. Shaune response yesterday.    Last read by Lynwood FORBES Better, MD Jim at 10:27AM on 12/10/2023.

## 2024-01-02 ENCOUNTER — Ambulatory Visit (HOSPITAL_BASED_OUTPATIENT_CLINIC_OR_DEPARTMENT_OTHER): Admitting: Cardiovascular Disease

## 2024-01-02 ENCOUNTER — Encounter (HOSPITAL_BASED_OUTPATIENT_CLINIC_OR_DEPARTMENT_OTHER): Payer: Self-pay | Admitting: Cardiovascular Disease

## 2024-01-02 ENCOUNTER — Other Ambulatory Visit

## 2024-01-02 VITALS — BP 144/62 | HR 51 | Ht 69.5 in | Wt 280.1 lb

## 2024-01-02 DIAGNOSIS — E669 Obesity, unspecified: Secondary | ICD-10-CM

## 2024-01-02 DIAGNOSIS — E785 Hyperlipidemia, unspecified: Secondary | ICD-10-CM

## 2024-01-02 DIAGNOSIS — I1A0 Resistant hypertension: Secondary | ICD-10-CM

## 2024-01-02 DIAGNOSIS — R7303 Prediabetes: Secondary | ICD-10-CM

## 2024-01-02 DIAGNOSIS — G4733 Obstructive sleep apnea (adult) (pediatric): Secondary | ICD-10-CM | POA: Diagnosis not present

## 2024-01-02 NOTE — Patient Instructions (Signed)
 Medication Instructions:  Your physician recommends that you continue on your current medications as directed. Please refer to the Current Medication list given to you today.   Labwork: NONE  Testing/Procedures: NONE  Follow-Up: 4 MONTHS WITH CAITLIN W NP OR DR [place]   If you need a refill on your cardiac medications before your next appointment, please call your pharmacy.

## 2024-01-02 NOTE — Progress Notes (Signed)
 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  He was [facility] seen [facility].  His blood pressure did worsen over the preceding 2 years.  Losartan  was switched to valsartan .  Catecholamines and metanephrines were unremarkable.  Renal Dopplers revealed mild stenosis on the right.  He was started on rosuvastatin .  Spironolactone  was added and he was then started on Zepbound  by the healthy and wellness team.  Blood pressure remained uncontrolled and he was started on hydralazine .  He last saw Reche Finder, NP 06/2023 and blood pressures had improved to the 100s over 60s to 70s.  Hydralazine  was discontinued.  History of Present Illness Dr. Better notes that his BP is usually around 130/70 mmHg when he takes his medications. He missed a few doses over the weekend. He was previously on hydralazine  but stopped taking it regularly because it made him feel terrible when his blood pressure dropped very low, and now takes it as needed. He hasn't needed any extra doses recently.  He monitors his blood pressure in the mornings and occasionally in the evenings. His current medications include amlodipine , spironolactone , and a valsartan -hydrochlorothiazide  combination.  He takes rosuvastatin  three times a week to minimize side effects, with his LDL last recorded at 84 mg/dL. He denies history of myalgias or intolerance.  He experiences nocturnal cramps, particularly in the summer, which he attributes to dehydration. An episode of dehydration in July elevated his creatinine to 1.9 mg/dL. He wonders if Mounjaro  might  affect his desire to drink water.  He has a history of sleep apnea, managed with a retainer, and reports that weight loss has improved his symptoms. He struggles with sleep, often getting only five to six hours per night, attributing this to personal habits such as watching TV late at night. He has not tried any sleep aids or apps.  He engages in physical activity through yard work and walking at air products and chemicals places about three times a week. He used to play golf once a week until the weather turned cold. He has a subscription to a PT program but has not started it yet. He usually rides a cart while playing golf but ends up walking due to the terrain.  He has a history of kidney stones and takes spironolactone  partly for this reason. He has experienced cramps and dehydration, especially during hot weather, and has questioned whether his medications may contribute to these issues.  No issues with breathing.   Past Medical History:  Diagnosis Date   Acute meniscal tear of left knee    Allergy    fire ants   Back pain    Complication of anesthesia    Coronary artery disease    Edema of both lower extremities    Fatigue    History of kidney stones    Hyperlipidemia    Hypertension    Joint pain    Osteoarthritis    PONV (postoperative nausea and vomiting)    Sleep apnea    SOB (shortness of breath) on exertion    Vitamin D  deficiency  Past Surgical History:  Procedure Laterality Date   BACK SURGERY     laminectomy L5-S1   COLONOSCOPY WITH PROPOFOL  N/A 03/14/2017   Procedure: COLONOSCOPY WITH PROPOFOL ;  Surgeon: Rosalie Kitchens, MD;  Location: [facility];  Service: Endoscopy;  Laterality: N/A;   Colonscopy     polyps   CYSTOSCOPY KIDNEY W/ URETERAL GUIDE WIRE  2010   lithotripsy   CYSTOSCOPY/RETROGRADE/URETEROSCOPY/STONE EXTRACTION WITH BASKET Right 02/16/2016   Procedure: CYSTOSCOPY/RETROGRADE/RIGHT FLEXIBLE URETEROSCOPY/STONE EXTRACTION WITH BASKET;  Surgeon: Gretel Ferrara, MD;   Location: [facility];  Service: Urology;  Laterality: Right;   HOLMIUM LASER APPLICATION Left 02/16/2016   Procedure: HOLMIUM LASER APPLICATION;  Surgeon: Gretel Ferrara, MD;  Location: [facility];  Service: Urology;  Laterality: Left;   KNEE ARTHROSCOPY Right 2013   KNEE ARTHROSCOPY WITH MEDIAL MENISECTOMY Left 03/12/2015   Procedure: LEFT KNEE ARTHROSCOPY WITH PARTIAL MEDIAL MENISCECTOMY;  Surgeon: Lonni CINDERELLA Poli, MD;  Location: [facility];  Service: Orthopedics;  Laterality: Left;   LAMINECTOMY  1999   MR HIPS BILATERAL     2009&2010   TOTAL HIP REVISION  09/15/2011   Procedure: TOTAL HIP REVISION;  Surgeon: Lonni CINDERELLA Poli, MD;  Location: [facility];  Service: Orthopedics;  Laterality: Right;  Revision right hip acetabular component    Current Medications: Active Medications[1]   Allergies:   Patient has no known allergies.   Social History   Socioeconomic History   Marital status: Married    Spouse name: Not on file   Number of children: 1   Years of education: Not on file   Highest education level: Professional school degree (e.g., MD, DDS, DVM, JD)  Occupational History   Not on file  Tobacco Use   Smoking status: Never   Smokeless tobacco: Never  Vaping Use   Vaping status: Never Used  Substance and Sexual Activity   Alcohol use: Yes    Alcohol/week: 7.0 standard drinks of alcohol    Types: 7 Glasses of wine per week    Comment: glass of wine per night   Drug use: No   Sexual activity: Not on file  Other Topics Concern   Not on file  Social History Narrative   Not on file   Social Drivers of Health   Tobacco Use: Low Risk (12/05/2023)   Patient History    Smoking Tobacco Use: Never    Smokeless Tobacco Use: Never    Passive Exposure: Not on file  Financial Resource Strain: Low Risk (12/04/2023)   Overall Financial Resource Strain (CARDIA)    Difficulty of Paying Living Expenses: Not very hard  Food Insecurity: No Food Insecurity (12/04/2023)   Epic     Worried About Programme Researcher, Broadcasting/film/video in the Last Year: Never true    Ran Out of Food in the Last Year: Never true  Transportation Needs: No Transportation Needs (12/04/2023)   Epic    Lack of Transportation (Medical): No    Lack of Transportation (Non-Medical): No  Physical Activity: Insufficiently Active (12/04/2023)   Exercise Vital Sign    Days of Exercise per Week: 3 days    Minutes of Exercise per Session: 30 min  Stress: Stress Concern Present (12/04/2023)   [employer] of Occupational Health - Occupational Stress Questionnaire    Feeling of Stress: To some extent  Social Connections: Moderately Integrated (12/04/2023)   Social Connection and Isolation Panel    Frequency of Communication with Friends and Family: Three times a week    Frequency of Social  Gatherings with Friends and Family: Once a week    Attends Religious Services: 1 to 4 times per year    Active Member of [employer] or Organizations: No    Attends Engineer, Structural: Not on file    Marital Status: Married  Recent Concern: Social Connections - Moderately Isolated (12/03/2023)   Social Connection and Isolation Panel    Frequency of Communication with Friends and Family: More than three times a week    Frequency of Social Gatherings with Friends and Family: More than three times a week    Attends Religious Services: Never    Database Administrator or Organizations: No    Attends Banker Meetings: Never    Marital Status: Married  Depression (PHQ2-9): Medium Risk (12/05/2023)   Depression (PHQ2-9)    PHQ-2 Score: 9  Alcohol Screen: Low Risk (12/04/2023)   Alcohol Screen    Last Alcohol Screening Score (AUDIT): 3  Housing: Low Risk (12/04/2023)   Epic    Unable to Pay for Housing in the Last Year: No    Number of Times Moved in the Last Year: 0    Homeless in the Last Year: No  Utilities: Not At Risk (12/03/2023)   Epic    Threatened with loss of utilities: No  Health Literacy:  Adequate Health Literacy (12/03/2023)   B1300 Health Literacy    Frequency of need for help with medical instructions: Never     Family History: The patient's family history includes Anemia in his father; Cancer in his father; Colon cancer in his father; Diabetes in his father and mother; Heart attack in his sister; Heart disease in his mother; Heart failure in his brother; Hypertension in his father and mother; Kidney failure in his mother; Obesity in his brother, brother, brother, brother, father, mother, and sister; Prostatitis in his brother; Skin cancer in his father; Sleep apnea in his brother; Stroke in his mother. There is no history of Prostate cancer.  ROS:   Please see the history of present illness.     All other systems reviewed and are negative.  EKGs/Labs/Other Studies Reviewed:    EKG:  EKG is ordered today.   EKG Interpretation Date/Time:  Wednesday January 02 2024 09:15:40 EST Ventricular Rate:  51 PR Interval:  210 QRS Duration:  112 QT Interval:  422 QTC Calculation: 388 R Axis:   81  Text Interpretation: Sinus bradycardia with 1st degree A-V block When compared with ECG of 30-Nov-2022 10:27, No significant change was found Confirmed by Raford Riggs ([id number]) on 01/02/2024 9:27:57 AM         Recent Labs: 04/12/2023: Magnesium  1.9 11/12/2023: ALT 14; BUN 35; Creatinine, Ser 1.32; Potassium 4.9; Sodium 136; TSH 2.46 12/05/2023: Hemoglobin 13.2; Platelets 174.0   Recent Lipid Panel    Component Value Date/Time   CHOL 147 11/12/2023 0936   CHOL 162 08/09/2023 1120   TRIG 85.0 11/12/2023 0936   HDL 46.40 11/12/2023 0936   HDL 43 08/09/2023 1120   CHOLHDL 3 11/12/2023 0936   VLDL 17.0 11/12/2023 0936   LDLCALC 84 11/12/2023 0936   LDLCALC 104 (H) 08/09/2023 1120   LDLCALC 104 (H) 06/05/2018 1352    Physical Exam:    VS:  BP (!) 142/78 (BP Location: Left Arm, Patient Position: Sitting, Cuff Size: Large)   Pulse (!) 51   Ht 5' 9.5 (1.765 m)   Wt  280 lb 1.6 oz (127.1 kg)   SpO2 100%   BMI 40.77  kg/m  , BMI Body mass index is 40.77 kg/m. GENERAL:  Well appearing HEENT: Pupils equal round and reactive, fundi not visualized, oral mucosa unremarkable NECK:  No jugular venous distention, waveform within normal limits, carotid upstroke brisk and symmetric, no bruits, no thyromegaly LUNGS:  Clear to auscultation bilaterally HEART:  RRR.  PMI not displaced or sustained,S1 and S2 within normal limits, no S3, no S4, no clicks, no rubs, no murmurs ABD:  Non-distended EXT:  2 plus pulses throughout, trivial L LE edema, no cyanosis no clubbing SKIN:  No rashes no nodules NEURO:  Cranial nerves II through XII grossly intact, motor grossly intact throughout PSYCH:  Cognitively intact, oriented to person place and time   ASSESSMENT/PLAN:    Assessment & Plan # Resistant hypertension Blood pressure reasonably controlled with amlodipine , spironolactone , valsartan  hydrochlorothiazide . Occasional high readings due to missed doses. Weight loss expected to improve control.  Diet has been off recently as his son got married last weekend. - Continue amlodipine , spironolactone , valsartan /hydrochlorothiazide . - Use hydralazine  as needed. - Monitor blood pressure regularly.  BP goal <130/80. - Aim for continued weight loss.  # Non-obstructive CAD: # Dyslipidemia Calcium  score was 33rd percentile in 2022.  Mild renal artery stenosis.  LDL at 84 mg/dL with rosuvastatin .  He prefers to keep his current statin dose.  Calcium  score indicates minimal disease. Current management adequate. - Continue rosuvastatin  three times a week. - Maintain current cholesterol levels.  # Obstructive sleep apnea Managed with retainer, effective with weight loss. - Continue using retainer.  # Obesity Weight loss goal to improve health and reduce medication needs. Current exercise includes yard work and walking. - Encourage regular exercise. - Consider physical therapy  program. - Aim for weight loss.    Disposition:    FU with MD/PharmD in 4 months    Medication Adjustments/Labs and Tests Ordered: Current medicines are reviewed at length with the patient today.  Concerns regarding medicines are outlined above.  No orders of the defined types were placed in this encounter.  No orders of the defined types were placed in this encounter.    Signed, Annabella Scarce, MD  01/02/2024 7:54 AM    [facility]     [1]  No outpatient medications have been marked as taking for the 01/02/24 encounter (Appointment) with Scarce Annabella, MD.

## 2024-01-11 ENCOUNTER — Other Ambulatory Visit (HOSPITAL_COMMUNITY): Payer: Self-pay

## 2024-01-11 ENCOUNTER — Ambulatory Visit (HOSPITAL_BASED_OUTPATIENT_CLINIC_OR_DEPARTMENT_OTHER)

## 2024-01-11 DIAGNOSIS — I701 Atherosclerosis of renal artery: Secondary | ICD-10-CM

## 2024-01-11 DIAGNOSIS — I10 Essential (primary) hypertension: Secondary | ICD-10-CM | POA: Diagnosis not present

## 2024-01-11 DIAGNOSIS — I25118 Atherosclerotic heart disease of native coronary artery with other forms of angina pectoris: Secondary | ICD-10-CM

## 2024-01-14 ENCOUNTER — Ambulatory Visit (HOSPITAL_BASED_OUTPATIENT_CLINIC_OR_DEPARTMENT_OTHER): Payer: Self-pay | Admitting: Family

## 2024-01-14 DIAGNOSIS — I701 Atherosclerosis of renal artery: Secondary | ICD-10-CM

## 2024-01-30 ENCOUNTER — Ambulatory Visit (INDEPENDENT_AMBULATORY_CARE_PROVIDER_SITE_OTHER): Admitting: Nurse Practitioner

## 2024-01-30 ENCOUNTER — Other Ambulatory Visit

## 2024-01-30 ENCOUNTER — Encounter (INDEPENDENT_AMBULATORY_CARE_PROVIDER_SITE_OTHER): Payer: Self-pay | Admitting: Nurse Practitioner

## 2024-01-30 ENCOUNTER — Other Ambulatory Visit (HOSPITAL_COMMUNITY): Payer: Self-pay

## 2024-01-30 VITALS — BP 129/83 | HR 63 | Temp 98.0°F | Ht 68.5 in | Wt 272.0 lb

## 2024-01-30 DIAGNOSIS — Z6841 Body Mass Index (BMI) 40.0 and over, adult: Secondary | ICD-10-CM | POA: Diagnosis not present

## 2024-01-30 DIAGNOSIS — N183 Chronic kidney disease, stage 3 unspecified: Secondary | ICD-10-CM

## 2024-01-30 DIAGNOSIS — E559 Vitamin D deficiency, unspecified: Secondary | ICD-10-CM | POA: Diagnosis not present

## 2024-01-30 DIAGNOSIS — I129 Hypertensive chronic kidney disease with stage 1 through stage 4 chronic kidney disease, or unspecified chronic kidney disease: Secondary | ICD-10-CM

## 2024-01-30 DIAGNOSIS — R7303 Prediabetes: Secondary | ICD-10-CM

## 2024-01-30 DIAGNOSIS — E66813 Obesity, class 3: Secondary | ICD-10-CM | POA: Diagnosis not present

## 2024-01-30 DIAGNOSIS — E88819 Insulin resistance, unspecified: Secondary | ICD-10-CM

## 2024-01-30 DIAGNOSIS — I10 Essential (primary) hypertension: Secondary | ICD-10-CM

## 2024-01-30 MED ORDER — TIRZEPATIDE 7.5 MG/0.5ML ~~LOC~~ SOAJ
7.5000 mg | SUBCUTANEOUS | 0 refills | Status: AC
  Filled 2024-01-30: qty 6, 84d supply, fill #0

## 2024-01-30 NOTE — Progress Notes (Signed)
 " Office: [phone]  /  Fax: [phone]  WEIGHT SUMMARY AND BIOMETRICS  Weight Lost Since Last Visit: 0  Weight Gained Since Last Visit: 1 lb   Vitals Temp: 98 F (36.7 C) BP: 129/83 Pulse Rate: 63 SpO2: 99 %   Anthropometric Measurements Height: 5' 8.5 (1.74 m) Weight: 272 lb (123.4 kg) BMI (Calculated): 40.75 Weight at Last Visit: 271 lb Weight Lost Since Last Visit: 0 Weight Gained Since Last Visit: 1 lb Starting Weight: 280 lb Total Weight Loss (lbs): 45 lb (20.4 kg) Peak Weight: 320 lb   Body Composition  Body Fat %: 39 % Fat Mass (lbs): 106.4 lbs Muscle Mass (lbs): 158.2 lbs Total Body Water (lbs): 125.8 lbs Visceral Fat Rating : 26   Other Clinical Data Fasting: no Labs: no Today's Visit #: 33 Starting Date: 02/02/21 Comments: rec'd height today;5 8.5    Total Weight Loss: 46 pounds  Bio Impedance Data reviewed with patient: Muscle is up 4.6 pounds, adipose is down 3 pounds. Visceral fat rating is down 1 point from 27 to 26.  HPI  Chief Complaint: OBESITY  Keith Brown is here to discuss his progress with his obesity treatment plan. He is on the the Category 3 Plan and states he is following his eating plan approximately 40 % of the time. He states he is exercising 30 minutes 2-3 days per week.   Interval History:  Since last office visit he was at the beach in [place] for 5 weeks. He is getting 100 grams of protein daily. He is getting 1 salad a day and snacks on fresh veggies and fruit. He is drinking 3-4 glasses of water a day. He drinks spindrifts.   Keith Brown does have hypertension and his BP's are currently well controlled on Spironolactone  25 mg every day,  amlodipine  10 mg every day and Diovan  -HCT 320/25 mg every day. Denies headaches, chest pain shortness of breath at rest and dizziness  BP Readings from Last 3 Encounters:  01/30/24 129/83  01/02/24 (!) 144/62  12/05/23 104/64    Pharmacotherapy for weight loss: He is currently taking  Mounjaro  7.5 mg SQ QW for prediabetes and off label for medical weight loss.  Denies side effects.  He is on Metformin  500 mg every day for insulin  resistance and denies side effects. He will occasionally get loose stools day of shot. He has been eating smaller portions.   He continues to take Vit D3 and B12 for deficiency.   PHYSICAL EXAM:  Blood pressure 129/83, pulse 63, temperature 98 F (36.7 C), height 5' 8.5 (1.74 m), weight 272 lb (123.4 kg), SpO2 99%. Body mass index is 40.76 kg/m.  General: Well Developed, well nourished, and in no acute distress.  HEENT: Normocephalic, atraumatic; EOMI, sclerae are anicteric. Skin: Warm and dry, good turgor Chest:  Normal excursion, shape, no gross ABN Respiratory: No conversational dyspnea; speaking in full sentences NeuroM-Sk:  Normal gross ROM * 4 extremities  Psych: A and O X 3, insight adequate, mood- full    DIAGNOSTIC DATA REVIEWED:  BMET    Component Value Date/Time   NA 136 11/12/2023 0936   NA 137 09/06/2023 0803   K 4.9 11/12/2023 0936   CL 106 11/12/2023 0936   CO2 23 11/12/2023 0936   GLUCOSE 89 11/12/2023 0936   BUN 35 (H) 11/12/2023 0936   BUN 37 (H) 09/06/2023 0803   CREATININE 1.32 11/12/2023 0936   CREATININE 0.99 06/05/2018 1352   CALCIUM  9.5 11/12/2023 0936   GFRNONAA  43 (L) 02/16/2016 1839   GFRAA 49 (L) 02/16/2016 1839   Lab Results  Component Value Date   HGBA1C 5.6 11/12/2023   HGBA1C 5.6 01/23/2017   Lab Results  Component Value Date   INSULIN  17.7 08/09/2023   INSULIN  20.6 02/02/2021   Lab Results  Component Value Date   TSH 2.46 11/12/2023   CBC    Component Value Date/Time   WBC 7.3 12/05/2023 1023   RBC 4.39 12/05/2023 1023   HGB 13.2 12/05/2023 1023   HGB 15.7 07/25/2021 0742   HCT 39.6 12/05/2023 1023   HCT 46.8 07/25/2021 0742   PLT 174.0 12/05/2023 1023   PLT 156 07/25/2021 0742   MCV 90.3 12/05/2023 1023   MCV 89 07/25/2021 0742   MCH 29.9 07/25/2021 0742   MCH 29.4  03/31/2020 1510   MCHC 33.2 12/05/2023 1023   RDW 14.8 12/05/2023 1023   RDW 13.1 07/25/2021 0742   Iron Studies    Component Value Date/Time   IRON 61 12/05/2023 1023   TIBC 274 12/05/2023 1023   FERRITIN 477 (H) 12/05/2023 1023   IRONPCTSAT 22 12/05/2023 1023   Lipid Panel     Component Value Date/Time   CHOL 147 11/12/2023 0936   CHOL 162 08/09/2023 1120   TRIG 85.0 11/12/2023 0936   HDL 46.40 11/12/2023 0936   HDL 43 08/09/2023 1120   CHOLHDL 3 11/12/2023 0936   VLDL 17.0 11/12/2023 0936   LDLCALC 84 11/12/2023 0936   LDLCALC 104 (H) 08/09/2023 1120   LDLCALC 104 (H) 06/05/2018 1352   Hepatic Function Panel     Component Value Date/Time   PROT 6.8 11/12/2023 0936   PROT 6.6 09/06/2023 0803   ALBUMIN  4.4 11/12/2023 0936   ALBUMIN  4.2 09/06/2023 0803   AST 16 11/12/2023 0936   ALT 14 11/12/2023 0936   ALKPHOS 64 11/12/2023 0936   BILITOT 0.5 11/12/2023 0936   BILITOT 0.4 09/06/2023 0803   BILIDIR 0.22 01/24/2023 1436      Component Value Date/Time   TSH 2.46 11/12/2023 0936   Nutritional Lab Results  Component Value Date   VD25OH 50.58 11/12/2023   VD25OH 34.4 08/09/2023   VD25OH 39.95 11/09/2022     ASSESSMENT AND PLAN  Class 3 severe obesity with serious comorbidity and body mass index (BMI) of 40.0 to 44.9 in adult, unspecified obesity type (HCC) TREATMENT PLAN FOR OBESITY:  Recommended Dietary Goals  Keith Brown is currently in the action stage of change. As such, his goal is to continue weight management plan. He has agreed to the Category 3 Plan.  Behavioral Intervention  We discussed the following Behavioral Modification Strategies today: continue to work on maintaining a reduced calorie state, getting the recommended amount of protein, incorporating whole foods, making healthy choices, staying well hydrated and practicing mindfulness when eating. and increase protein intake, fibrous foods (25 grams per day for women, 30 grams for men) and water to  improve satiety and decrease hunger signals. .    Recommended Physical Activity Goals  Keith Brown has been advised to work up to 150 minutes of moderate intensity aerobic activity a week and strengthening exercises 2-3 times per week for cardiovascular health, weight loss maintenance and preservation of muscle mass.   He has agreed to Increase physical activity in their day and reduce sedentary time (increase NEAT). and Continue aerobic activity with a goal of 150 minutes a week at moderate intensity.    Pharmacotherapy We discussed various medication options to help Keith Brown  with his weight loss efforts and we both agreed to continue Mounjaro  7.5 mg SQ QW for prediabetes and off label for medical weight loss. Denies abdominal pain and constipation  ASSOCIATED CONDITIONS ADDRESSED TODAY  Action/Plan  Essential hypertension Continue Spironolactone  25 mg every day,  amlodipine  10 mg every day and Diovan  -HCT 320/25 mg every day Continue Category 3 meal plan  and DASH diet Monitor BP and if consistently >140/90 notify PCP If develops headaches, chest pain, shortness of breath or dizziness go to ER Continue to follow with cardiology and PCP  Benign hypertension with CKD (chronic kidney disease) stage III (HCC)       Avoid NSAIDS and push fluids- 80 ounces of water daily   Vitamin D  deficiency Low vitamin D  levels can be associated with adiposity and may result in leptin resistance and weight gain. Also associated with fatigue.  Currently on vitamin D  supplementation without any adverse effects such as nausea, vomiting or muscle weakness.  Continue Cholecalciferol 2000 units daily  Insulin  resistance Prediabetes Continue Category 3  meal plan, limit simple carbohydrates. Increased lean proteins, water and fiber Continue Mounjaro  7.5 mg SQ QW, denies side effects Continue exercise with current goal of 150 minutes of moderate to high intensity exercise/week.  -     Tirzepatide ; Inject 7.5 mg  into the skin once a week.  Dispense: 6 mL; Refill: 0         Return in about 8 weeks (around 03/26/2024).SABRA He was informed of the importance of frequent follow up visits to maximize his success with intensive lifestyle modifications for his multiple health conditions.   ATTESTASTION STATEMENTS:  Reviewed by clinician on day of visit: allergies, medications, problem list, medical history, surgical history, family history, social history, and previous encounter notes.     Keith Brown ANP-C "

## 2024-03-04 ENCOUNTER — Ambulatory Visit: Admitting: Dermatology

## 2024-03-26 ENCOUNTER — Ambulatory Visit (INDEPENDENT_AMBULATORY_CARE_PROVIDER_SITE_OTHER): Admitting: Family Medicine

## 2024-05-05 ENCOUNTER — Encounter (HOSPITAL_BASED_OUTPATIENT_CLINIC_OR_DEPARTMENT_OTHER): Admitting: Family

## 2024-11-12 ENCOUNTER — Ambulatory Visit: Admitting: Family Medicine

## 2024-12-09 ENCOUNTER — Ambulatory Visit
